# Patient Record
Sex: Male | Born: 1935
Health system: Southern US, Community
[De-identification: ages and names within clinical notes are randomized; demographics above are authoritative.]

## PROBLEM LIST (undated history)

## (undated) DIAGNOSIS — I714 Abdominal aortic aneurysm, without rupture, unspecified: Secondary | ICD-10-CM

## (undated) DIAGNOSIS — F1011 Alcohol abuse, in remission: Secondary | ICD-10-CM

## (undated) DIAGNOSIS — J302 Other seasonal allergic rhinitis: Secondary | ICD-10-CM

## (undated) DIAGNOSIS — T7840XA Allergy, unspecified, initial encounter: Secondary | ICD-10-CM

## (undated) DIAGNOSIS — E7800 Pure hypercholesterolemia, unspecified: Secondary | ICD-10-CM

## (undated) DIAGNOSIS — C4491 Basal cell carcinoma of skin, unspecified: Secondary | ICD-10-CM

## (undated) DIAGNOSIS — Z87898 Personal history of other specified conditions: Secondary | ICD-10-CM

## (undated) DIAGNOSIS — K5792 Diverticulitis of intestine, part unspecified, without perforation or abscess without bleeding: Secondary | ICD-10-CM

## (undated) DIAGNOSIS — K759 Inflammatory liver disease, unspecified: Secondary | ICD-10-CM

## (undated) DIAGNOSIS — M1990 Unspecified osteoarthritis, unspecified site: Secondary | ICD-10-CM

## (undated) HISTORY — DX: Alcohol abuse, in remission: F10.11

## (undated) HISTORY — PX: BACK SURGERY: SHX140

## (undated) HISTORY — DX: Abdominal aortic aneurysm, without rupture: I71.4

## (undated) HISTORY — DX: Abdominal aortic aneurysm, without rupture, unspecified: I71.40

## (undated) HISTORY — PX: COLONOSCOPY: SHX174

## (undated) HISTORY — PX: CATARACT EXTRACTION W/ INTRAOCULAR LENS  IMPLANT, BILATERAL: SHX1307

## (undated) HISTORY — PX: SMALL INTESTINE SURGERY: SHX150

## (undated) HISTORY — DX: Other seasonal allergic rhinitis: J30.2

## (undated) HISTORY — DX: Unspecified osteoarthritis, unspecified site: M19.90

## (undated) HISTORY — DX: Diverticulitis of intestine, part unspecified, without perforation or abscess without bleeding: K57.92

## (undated) HISTORY — DX: Personal history of other specified conditions: Z87.898

## (undated) HISTORY — PX: BASAL CELL CARCINOMA EXCISION: SHX1214

## (undated) HISTORY — PX: MOHS SURGERY: SUR867

## (undated) HISTORY — DX: Allergy, unspecified, initial encounter: T78.40XA

---

## 1982-10-15 HISTORY — PX: POSTERIOR FUSION LUMBAR SPINE: SUR632

## 2003-03-04 DIAGNOSIS — K4090 Unilateral inguinal hernia, without obstruction or gangrene, not specified as recurrent: Secondary | ICD-10-CM

## 2003-03-04 HISTORY — DX: Unilateral inguinal hernia, without obstruction or gangrene, not specified as recurrent: K40.90

## 2003-03-04 HISTORY — PX: INGUINAL HERNIA REPAIR: SUR1180

## 2006-02-13 ENCOUNTER — Emergency Department (HOSPITAL_COMMUNITY): Admission: EM | Admit: 2006-02-13 | Discharge: 2006-02-13 | Payer: Self-pay | Admitting: Emergency Medicine

## 2009-08-29 ENCOUNTER — Ambulatory Visit: Payer: Self-pay | Admitting: Vascular Surgery

## 2010-03-05 ENCOUNTER — Ambulatory Visit
Admission: RE | Admit: 2010-03-05 | Discharge: 2010-03-05 | Payer: Self-pay | Source: Home / Self Care | Attending: Vascular Surgery | Admitting: Vascular Surgery

## 2010-03-05 ENCOUNTER — Ambulatory Visit: Admit: 2010-03-05 | Payer: Self-pay | Admitting: Vascular Surgery

## 2010-07-16 NOTE — Procedures (Signed)
DUPLEX ULTRASOUND OF ABDOMINAL AORTA   INDICATION:  Abdominal aortic aneurysm.   HISTORY:  Diabetes:  No  Cardiac:  No  Hypertension:  No  Smoking:  No  Family History:  Father and brother have AAA.  Previous Surgery:  No   DUPLEX EXAM:         AP (cm)                   TRANSVERSE (cm)  Proximal             2.85 cm                   2.97 cm  Mid                  2.0 cm                    2.2 cm  Distal               3.5 cm                    3.5 cm  Right Iliac          not visualized            not visualized  Left Iliac           not visualized            not visualized   PREVIOUS:   IMPRESSION:  1. Aneurysmal dilatation of the distal abdominal aorta with maximal      diameters are described above.  2. Diffuse atherosclerotic plaque noted throughout the abdominal aorta      with no increase in Doppler velocities.  3. Unable to adequately visualize the bilateral common iliac arteries      due to overlying bowel gas patterns.   ___________________________________________  Larina Earthly, M.D.   CH/MEDQ  D:  03/06/2010  T:  03/06/2010  Job:  [id number]

## 2010-07-16 NOTE — Consult Note (Signed)
NEW PATIENT CONSULTATION   DINA, MOBLEY  DOB:  05-May-1934                                       08/29/2009  QVZDG#:[id number]   REASON FOR CONSULTATION:  Infrarenal abdominal aortic aneurysm.   HISTORY OF PRESENT ILLNESS:  The patient is a 76 year old gentleman who  had an episode of abdominal discomfort in mid May.  He was seen in an  urgent care center for evaluation and workup was negative.  He was felt  to have a prominent aortic pulsation and subsequently underwent  ultrasound which confirmed a 3.4 cm infrarenal abdominal aortic  aneurysm.  The patient had no known history of this and actually had a  screening study in 2001 at which time he was told that he did not have  any evidence of an aneurysm.  His abdominal pain is completely resolved  and he has no symptoms referable to his aneurysm.   His past history is significant for lumbar disk surgery in 1984 and  hernia surgery in 2005.  He is otherwise healthy with no major medical  difficulties.  Specifically no heart disease, no hypertension or  elevated cholesterol.   His family history is significant for aneurysms.  His father died in [place] at age 47 following elective aneurysm repair.  This was in 1993.  His brother had aneurysm surgery at age 80 in 2001 with a successful  repair.  He does have coronary artery disease in his family history,  none in his own history.   SOCIAL HISTORY:  He is married with two children.  He is retired.  He  does not smoke or drink alcohol.   REVIEW OF SYSTEMS:  Is noted in his chart, specifically no major  difficulties.  His weight is 160 pounds.  He is 5 feet 10 inches tall.  He has no weight loss or weight gain.   PHYSICAL EXAMINATION:  General:  A well-developed, well-nourished white  male appearing stated age of 57.  Vital signs:  Blood pressure is  132/79, pulse 48, respirations 16.  He is in no acute distress.  HEENT:  Normal.  Chest:  Clear  bilaterally without rhonchi or wheezes.  Heart:  Regular rate and rhythm without murmur.  His radial pulses are 2+  bilaterally.  He has 2+ femoral, 2+ popliteal and 2+ dorsalis pedis  pulses without evidence of peripheral aneurysms.  His carotid arteries  are without bruits bilaterally.  Abdomen:  Soft, nontender.  He is thin.  He does have an easily palpable nontender aorta.  Musculoskeletal:  Shows no major deformities or cyanosis.  Neurological:  No focal  weakness or paresthesias.  Skin:  Without ulcers or rashes.   I did review his ultrasound report with the patient.  I explained that  this is a very small aneurysm.  I explained that we would recommend  repeat imaging of this again in 6 months and if is remains small would  recommend yearly ultrasound after that.  I explained the extremely low  risk for rupture of this small sized aneurysm and he understands serial  followup to rule out progression.  We will see him again in 6 months for  continued evaluation of his aneurysm.     Larina Earthly, M.D.  Electronically Signed   TFE/MEDQ  D:  08/29/2009  T:  08/30/2009  Job:  [id number]

## 2010-07-16 NOTE — Assessment & Plan Note (Signed)
OFFICE VISIT   GABRIELE, LOVELAND  DOB:  09-04-1934                                       03/05/2010  GURKY#:[id number]   The patient presents today for followup of small infrarenal abdominal  aortic aneurysm discovered in May of 2011 with an ultrasound for  abdominal discomfort.  This has completely resolved.  He has remained in  excellent health since my last visit with him.  He did have an episode  of microscopic hematuria on urinalysis and underwent an extensive  evaluation showing no evidence of significant pathology.   PHYSICAL EXAM:  Vital signs:  Blood pressure is 177/69, pulse 50,  respirations 18.  HEENT:  Normal.  Abdomen:  Soft.  He is thin and does  have an easily palpable aortic pulsation.  He is nontender.  Femoral and  popliteal pulses are 2+ and normal.  Musculoskeletal:  Shows no major  deformities or cyanosis.  Neurological:  No focal weakness or  paresthesias.  Skin:  Without ulcers or rashes.   He did undergo repeat ultrasound today which I have ordered and  reviewed.  This shows maximal diameter of 3.5 cm with no significant  change from his last study of 3.4 cm 6 months ago.  I discussed this at  length with the patient and have recommended yearly ultrasound followup  in our office.  Of note, he did have his father died after complications  of what sounds like an open repair and his brother who lives in [place]  recently underwent extensive surgery after having infection of his  prosthetic graft after anatomic bypass and removal of his infected  aortic graft.  I explained to the patient that both of these events  death around surgery and graft infection are extremely uncommon.  I also  explained with his small size that he would be unlikely that he would  develop difficulty in the future but would remain surveillance of his  aneurysm.     Larina Earthly, M.D.  Electronically Signed   TFE/MEDQ  D:  03/05/2010  T:  03/05/2010  Job:   [id number]

## 2011-03-13 ENCOUNTER — Ambulatory Visit (INDEPENDENT_AMBULATORY_CARE_PROVIDER_SITE_OTHER): Payer: Medicare Other | Admitting: *Deleted

## 2011-03-13 DIAGNOSIS — I714 Abdominal aortic aneurysm, without rupture: Secondary | ICD-10-CM

## 2011-03-19 NOTE — Procedures (Unsigned)
DUPLEX ULTRASOUND OF ABDOMINAL AORTA  INDICATION:  Follow up AAA.  HISTORY: Diabetes:  No. Cardiac:  No. Hypertension:  No. Smoking:  No. Connective Tissue Disorder: Family History:  Father and brother with AAA. Previous Surgery:  No.  DUPLEX EXAM:         AP (cm)                   TRANSVERSE (cm) Proximal             2.12 cm                   2.12 cm Mid                  3.56 cm                   3.44 cm Distal               2.05 cm                   2.12 cm Right Iliac          1.02 cm                   1.00 cm Left Iliac           0.99 cm                   1.00 cm  PREVIOUS:  Date: 03/05/09  AP:  3.5  TRANSVERSE:  3.5  IMPRESSION:  Stable abdominal aortic aneurysm measuring 3.56 cm X 3.44 cm.  ___________________________________________ Larina Earthly, M.D.  LT/MEDQ  D:  03/13/2011  T:  03/13/2011  Job:  [id number]

## 2011-03-20 ENCOUNTER — Encounter: Payer: Self-pay | Admitting: Vascular Surgery

## 2011-03-20 ENCOUNTER — Other Ambulatory Visit: Payer: Self-pay | Admitting: *Deleted

## 2011-03-20 DIAGNOSIS — I714 Abdominal aortic aneurysm, without rupture: Secondary | ICD-10-CM

## 2012-02-24 ENCOUNTER — Encounter: Payer: Self-pay | Admitting: Vascular Surgery

## 2012-03-11 ENCOUNTER — Encounter: Payer: Self-pay | Admitting: *Deleted

## 2012-03-12 ENCOUNTER — Encounter: Payer: Self-pay | Admitting: Cardiology

## 2012-03-12 ENCOUNTER — Other Ambulatory Visit: Payer: Self-pay | Admitting: *Deleted

## 2012-03-12 ENCOUNTER — Ambulatory Visit (INDEPENDENT_AMBULATORY_CARE_PROVIDER_SITE_OTHER): Payer: Medicare Other | Admitting: Cardiology

## 2012-03-12 VITALS — BP 132/62 | HR 72 | Ht 71.0 in | Wt 151.0 lb

## 2012-03-12 DIAGNOSIS — I714 Abdominal aortic aneurysm, without rupture, unspecified: Secondary | ICD-10-CM

## 2012-03-12 DIAGNOSIS — R079 Chest pain, unspecified: Secondary | ICD-10-CM

## 2012-03-12 DIAGNOSIS — E785 Hyperlipidemia, unspecified: Secondary | ICD-10-CM

## 2012-03-12 NOTE — Patient Instructions (Addendum)
Start aspirin 81mg  daily. Take this with food.  Your physician has requested that you have en exercise stress myoview. For further information please visit [email or web address] Please follow instruction sheet, as given.  Your physician recommends that you return for a FASTING lipid profile /liver profile/BMET in 1 month.   Your physician wants you to follow-up in: 1 year with Dr Shirlee Latch. (January 2015).  You will receive a reminder letter in the mail two months in advance. If you don't receive a letter, please call our office to schedule the follow-up appointment.

## 2012-03-12 NOTE — Progress Notes (Addendum)
Patient ID: Charles Stephenson, male   DOB: [birth year], 78 y.o.   MRN: [id number] PCP: Dr Brantley Stage in [place]  78 yo with h/o AAA but otherwise generally healthy presents for cardiology followup. Patient moved a couple of years ago to [place] from [place] but still sees a PCP in [place].  His brother recently passed away and had been in the hospital for a number of weeks prior.  Therefore, Charles Stephenson has been under a fair amount of stress.  He has noted a substernal burning sensation over the last 2 months.  This occurs most days.  It is not related to exertion but sometimes is related to meals.  Sometimes it is associated with belching.  He has started omeprazole and the chest sensation seems to have improved but still occurs.    He has good exercise tolerance.  He walks briskly 3.5 miles/day and plays golf weekly.  He has not noticed a decline in his exercise capacity.  About a year ago, he was washing dishes at church and had an episode of very severe chest pressure/tightness that lasted for about 30 seconds and resolved completely.  He still remembers it because of the severity.  He also was recently restarted on a statin due to elevated LDL.   ECG: NSR, poor anterior R wave progression  Labs (12/13): K 4.4, creatinine 0.93, LLD 143, HDL 73, TSH normal, HCT 44.6  PMH: 1. GERD 2. Hyperlipidemia 3. AAA: Followed at [facility]. Last abdominal US in 1/13 showed 3.6 x 3.4 cm AAA.  4. L-spine surgery 5. Hernia repair  SH: Married with 2 children.  Retired, moved from [employer] to [place] several years ago.  Nonsmoker.    FH: Father with AAA, brother with AAA/pulmonary fibrosis/MI at 59, grandfather probably had MI.   ROS: All systems reviewed and negative except as per HPI.   Current Outpatient Prescriptions  Medication Sig Dispense Refill  . atorvastatin (LIPITOR) 20 MG tablet Take 1 tablet by mouth Daily.      Marland Kitchen omeprazole (PRILOSEC OTC) 20 MG tablet Take 20 mg by mouth daily.      Marland Kitchen  aspirin EC 81 MG tablet Take 1 tablet (81 mg total) by mouth daily.        BP 132/62  Pulse 72  Ht 5\' 11"  (1.803 m)  Wt 151 lb (68.493 kg)  BMI 21.06 kg/m2 General: NAD Neck: No JVD, no thyromegaly or thyroid nodule.  Lungs: Clear to auscultation bilaterally with normal respiratory effort. CV: Nondisplaced PMI.  Heart regular S1/S2, no S3/S4, 1/6 SEM RUSB.  No peripheral edema.  No carotid bruit.  Normal pedal pulses.  Abdomen: Soft, nontender, no hepatosplenomegaly, no distention.  Skin: Intact without lesions or rashes.  Neurologic: Alert and oriented x 3.  Psych: Normal affect. Extremities: No clubbing or cyanosis.  HEENT: Normal.   Assessment/Plan: 1. Chest pain: This seems to have developed during a period of stress dealing with his brother's illness.  It may be GERD-related.  It is not clearly exertional.  ECG shows poor anterior R wave progression.  He does have vascular disease risk (has AAA).  Omeprazole seems to have helped some (or symptoms have subsided on their own) but he still gets episodes of chest burning.   - Start ASA 81 mg daily - ETT-Sestamibi for risk stratification.  2. Hyperlipidemia: Continue atorvastatin and check lipids/LFTs.  3. AAA: To be followed by [facility].  Has appointment soon.  4. Murmur: Minor systolic murmur RUSB, suspect some aortic  sclerosis.   Marca Ancona 03/12/2012 1:23 PM

## 2012-03-16 ENCOUNTER — Ambulatory Visit: Payer: Medicare Other | Admitting: Vascular Surgery

## 2012-03-16 ENCOUNTER — Other Ambulatory Visit: Payer: Medicare Other

## 2012-03-16 HISTORY — PX: CARDIOVASCULAR STRESS TEST: SHX262

## 2012-03-17 ENCOUNTER — Ambulatory Visit (HOSPITAL_COMMUNITY): Payer: Medicare Other | Attending: Cardiovascular Disease | Admitting: Radiology

## 2012-03-17 VITALS — Ht 71.0 in | Wt 156.0 lb

## 2012-03-17 DIAGNOSIS — I714 Abdominal aortic aneurysm, without rupture, unspecified: Secondary | ICD-10-CM | POA: Insufficient documentation

## 2012-03-17 DIAGNOSIS — R0602 Shortness of breath: Secondary | ICD-10-CM

## 2012-03-17 DIAGNOSIS — R079 Chest pain, unspecified: Secondary | ICD-10-CM | POA: Insufficient documentation

## 2012-03-17 DIAGNOSIS — R0609 Other forms of dyspnea: Secondary | ICD-10-CM | POA: Insufficient documentation

## 2012-03-17 DIAGNOSIS — R0989 Other specified symptoms and signs involving the circulatory and respiratory systems: Secondary | ICD-10-CM | POA: Insufficient documentation

## 2012-03-17 MED ORDER — TECHNETIUM TC 99M SESTAMIBI GENERIC - CARDIOLITE
10.0000 | Freq: Once | INTRAVENOUS | Status: AC | PRN
  Administered 2012-03-17: 10 via INTRAVENOUS

## 2012-03-17 MED ORDER — TECHNETIUM TC 99M SESTAMIBI GENERIC - CARDIOLITE
30.0000 | Freq: Once | INTRAVENOUS | Status: AC | PRN
  Administered 2012-03-17: 30 via INTRAVENOUS

## 2012-03-17 NOTE — Progress Notes (Addendum)
[facility] SITE 3 NUCLEAR MED [address] [place], [place] [address] [phone]    Cardiology Nuclear Med Study  Charles Stephenson is a 78 y.o. male     MRN : [id number]     DOB: [birth year]  Procedure Date: 03/17/2012  Nuclear Med Background Indication for Stress Test:  Evaluation for Ischemia History:  AAA 3.6cm, No prior known history of CAD, and '06 Myocardial Perfusion Study-normal per patient Recruitment consultant) Cardiac Risk Factors: Premature Family History - CAD and Lipids  Symptoms: Chest pain without exertion, occasionally occurs with meals (last occurrence at present 1/10),  DOE and Light-Headedness   Nuclear Pre-Procedure Caffeine/Decaff Intake:  None NPO After: 7:00pm   Lungs:  clear O2 Sat: 98% on room air. IV 0.9% NS with Angio Cath:  20g  IV Site: R Antecubital  IV Started by:  Bonnita Levan, RN  Chest Size (in):  42 Cup Size: n/a  Height: 5\' 11"  (1.803 m)  Weight:  156 lb (70.761 kg)  BMI:  Body mass index is 21.76 kg/(m^2). Tech Comments:  N/A    Nuclear Med Study 1 or 2 day study: 1 day  Stress Test Type:  Stress  Reading MD: Kristeen Miss, MD  Order Authorizing Provider:  Marca Ancona, MD  Resting Radionuclide: Technetium 61m Sestamibi  Resting Radionuclide Dose: 11.0 mCi   Stress Radionuclide:  Technetium 50m Sestamibi  Stress Radionuclide Dose: 32.7 mCi           Stress Protocol Rest HR: 47 Stress HR: 133  Rest BP: 133/86 Stress BP: 154/94  Exercise Time (min): 7:15 METS: 8.9   Predicted Max HR: 145 bpm % Max HR: 91.72 bpm     Dose of Adenosine (mg):  n/a Dose of Lexiscan: n/a mg  Dose of Atropine (mg): n/a Dose of Dobutamine: n/a mcg/kg/min (at max HR)  Stress Test Technologist: Irean Hong, RN  Nuclear Technologist:  Domenic Polite, CNMT     Rest Procedure:  Myocardial perfusion imaging was performed at rest 45 minutes following the intravenous administration of Technetium 34m Sestamibi. Rest ECG:  Sinus bradycardia at 47, TWI in  aVL  Stress Procedure:  The patient exercised on the treadmill utilizing the Bruce Protocol for 7:15 minutes, RPE=15. The patient stopped due to fatigue and there was no change in baseline chest pain 1/10. There were frequent PAC's, occasional PVC, Bigeminy. Technetium 57m Sestamibi was injected at peak exercise and myocardial perfusion imaging was performed after a brief delay. Stress ECG: No significant change from baseline ECG  QPS Raw Data Images:  There is interference from nuclear activity from structures below the diaphragm. This does not affect the ability to read the study. Stress Images:  There is mild attenuation of the entire inferior wall with normal uptake in the other regions.   Rest Images:  There is mild attenuation of the entire inferior wall with normal uptake in the other regions.  Subtraction (SDS):  No evidence of ischemia.  I suspect the inferior attenuation is due to uptake in the structures below the diaphragm.  Transient Ischemic Dilatation (Normal <1.22):  1.01 Lung/Heart Ratio (Normal <0.45):  0.29  Quantitative Gated Spect Images QGS EDV:  102 ml QGS ESV:  41 ml  Impression Exercise Capacity:  Good exercise capacity. BP Response:  Normal blood pressure response. Clinical Symptoms:  No significant symptoms noted. ECG Impression:  No significant ST segment change suggestive of ischemia. Comparison with Prior Nuclear Study: No images to compare  Overall Impression:  Normal stress nuclear study.  No evidence of ischemia.    LV Ejection Fraction: 60%.  LV Wall Motion:  NL LV Function; NL Wall Motion.  The inferior wall contracts normally.    Vesta Mixer, Montez Hageman., MD, [facility] 03/17/2012, 5:27 PM Office - [phone] Pager [phone]  Normal study with some inferior attenuation.  Please inform patient.   Marca Ancona 03/18/2012 10:56 AM

## 2012-03-18 NOTE — Progress Notes (Signed)
Results given.

## 2012-03-22 ENCOUNTER — Encounter: Payer: Self-pay | Admitting: Vascular Surgery

## 2012-03-22 ENCOUNTER — Other Ambulatory Visit: Payer: Self-pay | Admitting: *Deleted

## 2012-03-22 DIAGNOSIS — I714 Abdominal aortic aneurysm, without rupture: Secondary | ICD-10-CM

## 2012-03-23 ENCOUNTER — Ambulatory Visit (INDEPENDENT_AMBULATORY_CARE_PROVIDER_SITE_OTHER): Payer: Medicare Other | Admitting: Neurosurgery

## 2012-03-23 ENCOUNTER — Encounter (INDEPENDENT_AMBULATORY_CARE_PROVIDER_SITE_OTHER): Payer: Medicare Other | Admitting: *Deleted

## 2012-03-23 ENCOUNTER — Encounter: Payer: Self-pay | Admitting: Neurosurgery

## 2012-03-23 VITALS — BP 126/68 | HR 45 | Resp 16 | Ht 71.0 in | Wt 158.0 lb

## 2012-03-23 DIAGNOSIS — I714 Abdominal aortic aneurysm, without rupture: Secondary | ICD-10-CM

## 2012-03-23 NOTE — Progress Notes (Signed)
[facility] [place] AAA/Carotid Office Note  CC: AAA surveillance Referring Physician: Early  History of Present Illness: 78 year old male patient of Dr. Arbie Cookey followed for known AAA. The patient denies any unusual abdominal or back pain. The patient denies any new medical diagnoses or recent surgery.  Past Medical History  Diagnosis Date  . AAA (abdominal aortic aneurysm)   . History of vertigo   . H/O hypercholesterolemia     ROS: [x]  Positive   [ ]  Denies    General: [ ]  Weight loss, [ ]  Fever, [ ]  chills Neurologic: [ ]  Dizziness, [ ]  Blackouts, [ ]  Seizure [ ]  Stroke, [ ]  "Mini stroke", [ ]  Slurred speech, [ ]  Temporary blindness; [ ]  weakness in arms or legs, [ ]  Hoarseness Cardiac: [ ]  Chest pain/pressure, [ ]  Shortness of breath at rest [ ]  Shortness of breath with exertion, [ ]  Atrial fibrillation or irregular heartbeat Vascular: [ ]  Pain in legs with walking, [ ]  Pain in legs at rest, [ ]  Pain in legs at night,  [ ]  Non-healing ulcer, [ ]  Blood clot in vein/DVT,   Pulmonary: [ ]  Home oxygen, [ ]  Productive cough, [ ]  Coughing up blood, [ ]  Asthma,  [ ]  Wheezing Musculoskeletal:  [ ]  Arthritis, [ ]  Low back pain, [ ]  Joint pain Hematologic: [ ]  Easy Bruising, [ ]  Anemia; [ ]  Hepatitis Gastrointestinal: [ ]  Blood in stool, [ ]  Gastroesophageal Reflux/heartburn, [ ]  Trouble swallowing Urinary: [ ]  chronic Kidney disease, [ ]  on HD - [ ]  MWF or [ ]  TTHS, [ ]  Burning with urination, [ ]  Difficulty urinating Skin: [ ]  Rashes, [ ]  Wounds Psychological: [ ]  Anxiety, [ ]  Depression   Social History History  Substance Use Topics  . Smoking status: Never Smoker   . Smokeless tobacco: Never Used  . Alcohol Use: No    Family History Family History  Problem Relation Age of Onset  . AAA (abdominal aortic aneurysm) Father   . Heart disease Father   . AAA (abdominal aortic aneurysm) Brother   . Heart disease Brother     No Known Allergies  Current  Outpatient Prescriptions  Medication Sig Dispense Refill  . aspirin EC 81 MG tablet Take 1 tablet (81 mg total) by mouth daily.      Marland Kitchen atorvastatin (LIPITOR) 20 MG tablet Take 1 tablet by mouth Daily.      Marland Kitchen omeprazole (PRILOSEC OTC) 20 MG tablet Take 20 mg by mouth daily.        Physical Examination  Filed Vitals:   03/23/12 0912  BP: 126/68  Pulse: 45  Resp: 16    Body mass index is 22.04 kg/(m^2).  General:  WDWN in NAD Gait: Normal HEENT: WNL Eyes: Pupils equal Pulmonary: normal non-labored breathing , without Rales, rhonchi,  wheezing Cardiac: RRR, without  Murmurs, rubs or gallops; Abdomen: soft, NT, no masses Skin: no rashes, ulcers noted  Vascular Exam Pulses: 3+ radial pulses bilaterally, palpable femoral pulses bilaterally, there is a palpable pulsatile abdominal mass that is nontender Carotid bruits: Carotid pulses to auscultation no bruits are heard Extremities without ischemic changes, no Gangrene , no cellulitis; no open wounds;  Musculoskeletal: no muscle wasting or atrophy   Neurologic: A&O X 3; Appropriate Affect ; SENSATION: normal; MOTOR FUNCTION:  moving all extremities equally. Speech is fluent/normal  Non-Invasive Vascular Imaging AAA duplex today shows a maximum diameter 3.6 AP by 3.6 transverse which is a slight increase  from previous exam one year ago when he was 3.5.  ASSESSMENT/PLAN: Asymptomatic AAA that will followup in one year with repeat duplex. The patient's questions were encouraged and answered, he is in agreement with this plan.  Lauree Chandler ANP   Clinic MD: Early

## 2012-03-24 NOTE — Addendum Note (Signed)
Addended by: Dannielle Karvonen on: 03/24/2012 03:19 PM   Modules accepted: Orders

## 2012-03-25 ENCOUNTER — Encounter: Payer: Self-pay | Admitting: Cardiology

## 2012-04-17 ENCOUNTER — Other Ambulatory Visit: Payer: Self-pay

## 2012-04-20 ENCOUNTER — Other Ambulatory Visit (INDEPENDENT_AMBULATORY_CARE_PROVIDER_SITE_OTHER): Payer: Medicare Other

## 2012-04-20 DIAGNOSIS — R079 Chest pain, unspecified: Secondary | ICD-10-CM

## 2012-04-20 LAB — HEPATIC FUNCTION PANEL
ALT: 26 U/L (ref 0–53)
AST: 27 U/L (ref 0–37)
Albumin: 4.1 g/dL (ref 3.5–5.2)
Alkaline Phosphatase: 55 U/L (ref 39–117)
Bilirubin, Direct: 0.1 mg/dL (ref 0.0–0.3)
Total Bilirubin: 0.8 mg/dL (ref 0.3–1.2)
Total Protein: 7.1 g/dL (ref 6.0–8.3)

## 2012-04-20 LAB — LIPID PANEL
Cholesterol: 145 mg/dL (ref 0–200)
HDL: 62.3 mg/dL (ref >39.00)
LDL Cholesterol: 73 mg/dL (ref 0–99)
Total CHOL/HDL Ratio: 2
Triglycerides: 49 mg/dL (ref 0.0–149.0)
VLDL: 9.8 mg/dL (ref 0.0–40.0)

## 2012-04-20 LAB — BASIC METABOLIC PANEL
BUN: 16 mg/dL (ref 6–23)
CO2: 32 mEq/L (ref 19–32)
Calcium: 9.5 mg/dL (ref 8.4–10.5)
Chloride: 104 mEq/L (ref 96–112)
Creatinine, Ser: 1 mg/dL (ref 0.4–1.5)
GFR: 75.55 mL/min (ref >60.00)
Glucose, Bld: 85 mg/dL (ref 70–99)
Potassium: 4.4 mEq/L (ref 3.5–5.1)
Sodium: 140 mEq/L (ref 135–145)

## 2012-04-29 ENCOUNTER — Telehealth: Payer: Self-pay | Admitting: Cardiology

## 2012-04-29 NOTE — Telephone Encounter (Signed)
Follow Up    Returning phone call from earlier from [place].

## 2012-05-04 NOTE — Telephone Encounter (Signed)
Pt was notified of lab results. 

## 2012-10-06 ENCOUNTER — Other Ambulatory Visit: Payer: Self-pay

## 2013-01-06 ENCOUNTER — Other Ambulatory Visit: Payer: Self-pay

## 2013-02-28 ENCOUNTER — Emergency Department (HOSPITAL_COMMUNITY)
Admission: EM | Admit: 2013-02-28 | Discharge: 2013-02-28 | Disposition: A | Discharge status: Home / Self Care | Payer: Medicare Other | Source: Home / Self Care | Attending: Family Medicine | Admitting: Family Medicine

## 2013-02-28 ENCOUNTER — Encounter (HOSPITAL_COMMUNITY): Payer: Self-pay | Admitting: Emergency Medicine

## 2013-02-28 DIAGNOSIS — M5412 Radiculopathy, cervical region: Secondary | ICD-10-CM

## 2013-02-28 MED ORDER — GABAPENTIN 100 MG PO CAPS: 100.0000 mg | ORAL_CAPSULE | Freq: Every day | ORAL | Status: DC

## 2013-02-28 MED ORDER — PREDNISONE 10 MG PO KIT: PACK | ORAL | Status: DC

## 2013-02-28 NOTE — ED Notes (Signed)
Pt c/o intermittent right shoulder pain onset 2 months... Denies inj/trauma... Reports he plays golf often w/no problem Pain increases at night and will radiate to right middle finger... Denies: numbness/tingly Alert w/no signs of acute distress.

## 2013-02-28 NOTE — ED Provider Notes (Signed)
Charles Stephenson is a 78 y.o. male who presents to Urgent Care today for Right shoulder pain. This is been present for 2 months and has occurred without injury. Patient has pain in his right shoulder that radiates to his right third digit. The symptoms are worse at night.  They can wake him from sleep. He denies any weakness or numbness. He notes that ibuprofen p.m. Seems to help. He denies any difficulty walking or lack of coordination bowel or bladder dysfunction. He does have a history of lumbar radicular symptoms which has been well treated with epidural steroid injection in the past. He feels well otherwise.  No pain with overhand motion   Past Medical History  Diagnosis Date  . AAA (abdominal aortic aneurysm)   . History of vertigo   . H/O hypercholesterolemia    History  Substance Use Topics  . Smoking status: Never Smoker   . Smokeless tobacco: Never Used  . Alcohol Use: No   ROS as above Medications reviewed. No current facility-administered medications for this encounter.   Current Outpatient Prescriptions  Medication Sig Dispense Refill  . aspirin EC 81 MG tablet Take 1 tablet (81 mg total) by mouth daily.      Marland Kitchen atorvastatin (LIPITOR) 20 MG tablet Take 1 tablet by mouth Daily.      Marland Kitchen gabapentin (NEURONTIN) 100 MG capsule Take 1 capsule (100 mg total) by mouth at bedtime.  30 capsule  0  . omeprazole (PRILOSEC OTC) 20 MG tablet Take 20 mg by mouth daily.      . PredniSONE 10 MG KIT 12 day dose pack po  1 kit  0    Exam:  BP 160/100  Pulse 56  Temp(Src) 97.8 F (36.6 C) (Oral)  Resp 16  Ht 5\' 11"  (1.803 m)  Wt 155 lb (70.308 kg)  BMI 21.63 kg/m2  SpO2 98% Gen: Well NAD Neck: nontender to spinal midline. Normal extension and flexion range of motion. No rotational range of motion. However patient is significantly limited with lateral flexion range of motion. Negative Spurling's test bilaterally.  Right shoulder:  Shoulder is mildly tender at the bicipital  groove. Pain-free full range of motion. Negative impingement testing. Strength is intact throughout. Negative empty can.  Left shoulder:  Nontender normal strength full range of motion negative impingement testing  Neuro: sensation and strength are intact distally bilateral upper extremities  Capillary refill is intact throughout   Assessment and Plan: 78 y.o. male with  1)right shoulder pain. This is likely cervical radicular related. However impingement versus DJD is a possibility. Plan to treat with a prednisone dose pack and low dose gabapentin at bedtime.  I recommend followup with orthopedics in a few weeks for further evaluation and management.  Discussed warning signs or symptoms. Please see discharge instructions. Patient expresses understanding.      Rodolph Bong, MD 02/28/13 [phone]

## 2013-03-22 ENCOUNTER — Encounter: Payer: Self-pay | Admitting: Family

## 2013-03-23 ENCOUNTER — Ambulatory Visit: Payer: Medicare Other | Admitting: Family

## 2013-03-23 ENCOUNTER — Other Ambulatory Visit (HOSPITAL_COMMUNITY): Payer: Medicare Other

## 2013-03-23 ENCOUNTER — Ambulatory Visit (INDEPENDENT_AMBULATORY_CARE_PROVIDER_SITE_OTHER): Payer: Medicare Other | Admitting: Family

## 2013-03-23 ENCOUNTER — Encounter: Payer: Self-pay | Admitting: Family

## 2013-03-23 ENCOUNTER — Ambulatory Visit (HOSPITAL_COMMUNITY)
Admission: RE | Admit: 2013-03-23 | Discharge: 2013-03-23 | Disposition: A | Discharge status: Home / Self Care | Payer: Medicare Other | Source: Ambulatory Visit | Attending: Family | Admitting: Family

## 2013-03-23 VITALS — BP 124/79 | HR 51 | Resp 14 | Ht 71.0 in | Wt 154.0 lb

## 2013-03-23 DIAGNOSIS — I714 Abdominal aortic aneurysm, without rupture, unspecified: Secondary | ICD-10-CM | POA: Insufficient documentation

## 2013-03-23 NOTE — Patient Instructions (Signed)

## 2013-03-23 NOTE — Progress Notes (Signed)
[facility] [place]  Established Abdominal Aortic Aneurysm  History of Present Illness  Charles Stephenson is a 79 y.o. (04/29/1934) male patient of Dr. Donnetta Hutching who presents with chief complaint: follow up for AAA. His AAA was incidentally found during routine physical exam by palpation.   Previous studies demonstrate an AAA, measuring 3.6 cm a year ago.   The patient does not have back or abdominal pain, even though he has had back surgery.   The patient denies claudication in legs with walking, he walks 3-4 miles. The patient denies history of stroke or TIA symptoms. Pt denies new medical problems or recent surgeries. Father died of clotting disorder during surgery for AAA, as pt describes, sounds like possibly DIC. Pt's brother also has a AAA, had an EVAR placed which became infected, 10 hour surgery to correct this.  Pt Diabetic: No Pt smoker: non-smoker He denies any ETOH consumption  Past Medical History  Diagnosis Date  . AAA (abdominal aortic aneurysm)   . History of vertigo   . H/O hypercholesterolemia    Past Surgical History  Procedure Laterality Date  . Back surgery  1984    ruptured disc  . Hernia repair  2005  . Cardiovascular stress test  Jan. 14,2014  . Spine surgery     Social History History   Social History  . Marital Status: Married    Spouse Name: N/A    Number of Children: N/A  . Years of Education: N/A   Occupational History  . Not on file.   Social History Main Topics  . Smoking status: Never Smoker   . Smokeless tobacco: Never Used  . Alcohol Use: No  . Drug Use: No  . Sexual Activity: Not on file   Other Topics Concern  . Not on file   Social History Narrative  . No narrative on file   Family History Family History  Problem Relation Age of Onset  . AAA (abdominal aortic aneurysm) Father   . Heart disease Father   . AAA (abdominal aortic aneurysm) Brother   . Heart disease Brother     Current Outpatient  Prescriptions on File Prior to Visit  Medication Sig Dispense Refill  . atorvastatin (LIPITOR) 20 MG tablet Take 1 tablet by mouth Daily.      Marland Kitchen aspirin EC 81 MG tablet Take 1 tablet (81 mg total) by mouth daily.      Marland Kitchen gabapentin (NEURONTIN) 100 MG capsule Take 1 capsule (100 mg total) by mouth at bedtime.  30 capsule  0  . omeprazole (PRILOSEC OTC) 20 MG tablet Take 20 mg by mouth daily.      . PredniSONE 10 MG KIT 12 day dose pack po  1 kit  0   No current facility-administered medications on file prior to visit.   No Known Allergies  ROS: See HPI for pertinent positives and negatives.  Physical Examination  Filed Vitals:   03/23/13 0909  BP: 124/79  Pulse: 51  Resp: 14   Filed Weights   03/23/13 0909  Weight: 154 lb (69.854 kg)   Body mass index is 21.49 kg/(m^2).  General: A&O x 3, WD, fit appearing.  Pulmonary: Sym exp, good air movt, CTAB, no rales, rhonchi, or wheezing.   Cardiac: RRR, Nl S1, S2, no Murmurs, rubs or gallops, rare premature beats.  Carotid Bruits Left Right   Negative Negative   Aorta is is palpable Radial pulses are 3+ palpable and =  VASCULAR EXAM:                                                                                                         LE Pulses LEFT RIGHT       FEMORAL   palpable   palpable        POPLITEAL  not palpable   not palpable       POSTERIOR TIBIAL   palpable    palpable        DORSALIS PEDIS      ANTERIOR TIBIAL  palpable   palpable      Gastrointestinal: soft, NTND, -G/R, - HSM, - masses, - CVAT B.  Musculoskeletal: M/S 5/5 throughout, Extremities without ischemic changes.   Neurologic: CN 2-12 intact, Pain and light touch intact in extremities, Motor exam as listed above.  Non-Invasive Vascular Imaging  AAA Duplex (03/23/2013)  Previous size: 3.6 cm (Date: 03/23/12)  Current size:  4.0 cm (Date: 03/23/13)  Medical Decision Making  The patient is a 79 y.o. male who  presents with asymptomatic AAA with increasing size, increased 0.4 cm in 1 year.   Based on this patient's exam and diagnostic studies, and due to 2 immediate family members having AAA, the patient will follow up in 6 months instead of a year,  with the following studies: AAA Duplex.  The threshold for repair is AAA size > 5.5 cm, growth > 1 cm/yr, and symptomatic status.  I emphasized the importance of maximal medical management including strict control of blood pressure, blood glucose, and lipid levels, antiplatelet agents, obtaining regular exercise, and continued cessation of smoking.   The patient was given information about AAA including signs, symptoms, treatment, and how to minimize the risk of enlargement and rupture of aneurysms.    The patient was advised to call 911 should the patient experience sudden onset abdominal or back pain.   Thank you for allowing [place] to participate in this patient's care.  Clemon Chambers, RN, MSN, FNP-C [facility] [place] Office: [phone]  Clinic Physician: Scot Dock  03/23/2013, 9:09 AM

## 2013-03-23 NOTE — Addendum Note (Signed)
Addended by: Dorthula Rue L on: 03/23/2013 03:53 PM   Modules accepted: Orders

## 2013-03-31 ENCOUNTER — Other Ambulatory Visit (HOSPITAL_COMMUNITY): Payer: Medicare Other

## 2013-03-31 ENCOUNTER — Ambulatory Visit: Payer: Medicare Other | Admitting: Family

## 2013-09-19 ENCOUNTER — Encounter: Payer: Self-pay | Admitting: Vascular Surgery

## 2013-09-20 ENCOUNTER — Encounter: Payer: Self-pay | Admitting: Family

## 2013-09-20 ENCOUNTER — Ambulatory Visit (HOSPITAL_COMMUNITY)
Admission: RE | Admit: 2013-09-20 | Discharge: 2013-09-20 | Disposition: A | Discharge status: Home / Self Care | Payer: Medicare Other | Source: Ambulatory Visit | Attending: Family | Admitting: Family

## 2013-09-20 ENCOUNTER — Ambulatory Visit (INDEPENDENT_AMBULATORY_CARE_PROVIDER_SITE_OTHER): Payer: Medicare Other | Admitting: Family

## 2013-09-20 VITALS — BP 157/84 | HR 43 | Resp 14 | Ht 71.0 in | Wt 157.0 lb

## 2013-09-20 DIAGNOSIS — I714 Abdominal aortic aneurysm, without rupture, unspecified: Secondary | ICD-10-CM | POA: Insufficient documentation

## 2013-09-20 DIAGNOSIS — Z48812 Encounter for surgical aftercare following surgery on the circulatory system: Secondary | ICD-10-CM

## 2013-09-20 NOTE — Progress Notes (Addendum)
[facility] [place]  Established Abdominal Aortic Aneurysm  History of Present Illness  Charles Stephenson is a 79 y.o. (11/17/1934) male patient of Dr. Early who presents with chief complaint: follow up for AAA.  His AAA was incidentally found during routine physical exam by palpation.  Previous studies demonstrate an AAA measuring 4.0 cm on 03/23/13.  The patient does not have back or abdominal pain, even though he has had back surgery.  The patient denies claudication in legs with walking, he walks 3-4 miles.  The patient denies history of stroke or TIA symptoms, denies any cardiac problems.  Pt denies new medical problems or recent surgeries.  Father died of clotting disorder during surgery for AAA, as pt describes, sounds like possibly DIC.  Pt's brother also has a AAA, had an EVAR placed which became infected, 10 hour surgery to correct this.   Pt Diabetic: No  Pt smoker: non-smoker  He denies any ETOH consumption   Past Medical History  Diagnosis Date  . AAA (abdominal aortic aneurysm)   . History of vertigo   . H/O hypercholesterolemia    Past Surgical History  Procedure Laterality Date  . Back surgery  1984    ruptured disc  . Hernia repair  2005  . Cardiovascular stress test  Jan. 14,2014  . Spine surgery     Social History History   Social History  . Marital Status: Married    Spouse Name: N/A    Number of Children: N/A  . Years of Education: N/A   Occupational History  . Not on file.   Social History Main Topics  . Smoking status: Never Smoker   . Smokeless tobacco: Never Used  . Alcohol Use: No  . Drug Use: No  . Sexual Activity: Not on file   Other Topics Concern  . Not on file   Social History Narrative  . No narrative on file   Family History Family History  Problem Relation Age of Onset  . AAA (abdominal aortic aneurysm) Father   . Heart disease Father   . AAA (abdominal aortic aneurysm) Brother   . Heart disease  Brother     Current Outpatient Prescriptions on File Prior to Visit  Medication Sig Dispense Refill  . atorvastatin (LIPITOR) 20 MG tablet Take 1 tablet by mouth Daily.      . Multiple Vitamins-Minerals (CENTRUM SILVER PO) Take by mouth daily.      . aspirin EC 81 MG tablet Take 1 tablet (81 mg total) by mouth daily.      . gabapentin (NEURONTIN) 100 MG capsule Take 1 capsule (100 mg total) by mouth at bedtime.  30 capsule  0  . omeprazole (PRILOSEC OTC) 20 MG tablet Take 20 mg by mouth daily.      . PredniSONE 10 MG KIT 12 day dose pack po  1 kit  0   No current facility-administered medications on file prior to visit.   No Known Allergies  ROS: See HPI for pertinent positives and negatives.  Physical Examination  Filed Vitals:   09/20/13 0911  BP: 157/84  Pulse: 43  Resp: 14  Height: 5' 11" (1.803 m)  Weight: 157 lb (71.215 kg)  SpO2: 98%   Body mass index is 21.91 kg/(m^2).  General: A&O x 3, WD, fit appearing.  Pulmonary: Sym exp, good air movt, CTAB, no rales, rhonchi, or wheezing.  Cardiac: RRR, Nl S1, S2, no Murmurs, rubs or gallops, rare premature beats.     Carotid Bruits  Left  Right    Negative  Negative    Aorta is is palpable  Radial pulses are 3+ palpable and =   VASCULAR EXAM:  LE Pulses  LEFT  RIGHT   FEMORAL  3+palpable  3+palpable   POPLITEAL  2+ palpable  2+palpable   POSTERIOR TIBIAL  1+palpable  2+palpable   DORSALIS PEDIS  ANTERIOR TIBIAL  2+palpable  2+palpable    Gastrointestinal: soft, NTND, -G/R, - HSM, - masses, - CVAT B.  Musculoskeletal: M/S 5/5 throughout, Extremities without ischemic changes.  Neurologic: CN 2-12 intact, Pain and light touch intact in extremities, Motor exam as listed above.   Non-Invasive Vascular Imaging  AAA Duplex (09/20/2013)  ABDOMINAL AORTA DUPLEX EVALUATION    INDICATION: Evaluation of abdominal aorta.    PREVIOUS INTERVENTION(S):     DUPLEX EXAM:     LOCATION DIAMETER AP (cm) DIAMETER TRANSVERSE  (cm) VELOCITIES (cm/sec)  Aorta Proximal 2.77 2.78 49  Aorta Mid 2.24 2.20 59  Aorta Distal 4.17 4.14 58  Right Common Iliac Artery 1.45 1.41 98  Left Common Iliac Artery 1.45 1.48 96    Previous max aortic diameter:  3.9 x 4.0 Date: 03/23/2013     ADDITIONAL FINDINGS:     IMPRESSION: Patent abdominal aorta with a maximum diameter of 4.17 x 4.14 cm.     Compared to the previous exam:  Slight increase in the maximum diameter when compared to the previous exam.     Medical Decision Making  The patient is a 79 y.o. male who presents with asymptomatic AAA with no significant increase in size.   Based on this patient's exam and diagnostic studies, and two immediate family members that had AAA, the patient will follow up in 6 months  with the following studies: AAA Duplex.  Consideration for repair of AAA would be made when the size is 5.5 cm, growth > 1 cm/yr, and symptomatic status.  I emphasized the importance of maximal medical management including strict control of blood pressure, blood glucose, and lipid levels, antiplatelet agents, obtaining regular exercise, and continued cessation of smoking.   The patient was given information about AAA including signs, symptoms, treatment, and how to minimize the risk of enlargement and rupture of aneurysms.    The patient was advised to call 911 should the patient experience sudden onset abdominal or back pain.   Thank you for allowing us to participate in this patient's care.  Suzanne Nickel, RN, MSN, FNP-C [facility] [place] Office: [phone]  Clinic Physician: [place]  09/20/2013, 9:13 AM      

## 2013-09-20 NOTE — Addendum Note (Signed)
Addended by: Mena Goes on: 09/20/2013 03:41 PM   Modules accepted: Orders

## 2013-09-20 NOTE — Patient Instructions (Signed)
Abdominal Aortic Aneurysm An aneurysm is a weakened or damaged part of an artery wall that bulges from the normal force of blood pumping through the body. An abdominal aortic aneurysm is an aneurysm that occurs in the lower part of the aorta, the main artery of the body.  The major concern with an abdominal aortic aneurysm is that it can enlarge and burst (rupture) or blood can flow between the layers of the wall of the aorta through a tear (aorticdissection). Both of these conditions can cause bleeding inside the body and can be life threatening unless diagnosed and treated promptly. CAUSES  The exact cause of an abdominal aortic aneurysm is unknown. Some contributing factors are:   A hardening of the arteries caused by the buildup of fat and other substances in the lining of a blood vessel (arteriosclerosis).  Inflammation of the walls of an artery (arteritis).   Connective tissue diseases, such as Marfan syndrome.   Abdominal trauma.   An infection, such as syphilis or staphylococcus, in the wall of the aorta (infectious aortitis) caused by bacteria. RISK FACTORS  Risk factors that contribute to an abdominal aortic aneurysm may include:  Age older than 60 years.   High blood pressure (hypertension).  Male gender.  Ethnicity (white race).  Obesity.  Family history of aneurysm (first degree relatives only).  Tobacco use. PREVENTION  The following healthy lifestyle habits may help decrease your risk of abdominal aortic aneurysm:  Quitting smoking. Smoking can raise your blood pressure and cause arteriosclerosis.  Limiting or avoiding alcohol.  Keeping your blood pressure, blood sugar level, and cholesterol levels within normal limits.  Decreasing your salt intake. In somepeople, too much salt can raise blood pressure and increase your risk of abdominal aortic aneurysm.  Eating a diet low in saturated fats and cholesterol.  Increasing your fiber intake by including  whole grains, vegetables, and fruits in your diet. Eating these foods may help lower blood pressure.  Maintaining a healthy weight.  Staying physically active and exercising regularly. SYMPTOMS  The symptoms of abdominal aortic aneurysm may vary depending on the size and rate of growth of the aneurysm.Most grow slowly and do not have any symptoms. When symptoms do occur, they may include:  Pain (abdomen, side, lower back, or groin). The pain may vary in intensity. A sudden onset of severe pain may indicate that the aneurysm has ruptured.  Feeling full after eating only small amounts of food.  Nausea or vomiting or both.  Feeling a pulsating lump in the abdomen.  Feeling faint or passing out. DIAGNOSIS  Since most unruptured abdominal aortic aneurysms have no symptoms, they are often discovered during diagnostic exams for other conditions. An aneurysm may be found during the following procedures:  Ultrasonography (A one-time screening for abdominal aortic aneurysm by ultrasonography is also recommended for all men aged 65-75 years who have ever smoked).  X-ray exams.  A computed tomography (CT).  Magnetic resonance imaging (MRI).  Angiography or arteriography. TREATMENT  Treatment of an abdominal aortic aneurysm depends on the size of your aneurysm, your age, and risk factors for rupture. Medication to control blood pressure and pain may be used to manage aneurysms smaller than 6 cm. Regular monitoring for enlargement may be recommended by your caregiver if:  The aneurysm is 3-4 cm in size (an annual ultrasonography may be recommended).  The aneurysm is 4-4.5 cm in size (an ultrasonography every 6 months may be recommended).  The aneurysm is larger than 4.5 cm in   size (your caregiver may ask that you be examined by a vascular surgeon). If your aneurysm is larger than 6 cm, surgical repair may be recommended. There are two main methods for repair of an aneurysm:   Endovascular  repair (a minimally invasive surgery). This is done most often.  Open repair. This method is used if an endovascular repair is not possible. Document Released: 11/27/2004 Document Revised: 06/14/2012 Document Reviewed: 03/19/2012 Exit[employer] Patient Information 2015 [employer]. This information is not intended to replace advice given to you by your health care provider. Make sure you discuss any questions you have with your health care provider.  

## 2014-01-20 ENCOUNTER — Telehealth: Payer: Self-pay | Admitting: Vascular Surgery

## 2014-01-20 NOTE — Telephone Encounter (Signed)
-----   Message from [facility], RVT sent at 01/20/2014  8:31 AM EST ----- Regarding: RE: AAA Duplex scheduled 03/21/14 Keep the appointment as scheduled per our protocol.  Thank you,  Juliann Pulse ----- Message -----    From: Rufina Falco    Sent: 01/18/2014   1:42 PM      To: Kathi Ludwig Nickel, NP, # Subject: AAA Duplex scheduled 03/21/14                   As per the protocol, Mr. Carrier should return in 1 year for his AAA duplex.  Please let me know if I should keep the appointment scheduled as is or make changes.   Rip Harbour

## 2014-03-08 DIAGNOSIS — H6123 Impacted cerumen, bilateral: Secondary | ICD-10-CM | POA: Diagnosis not present

## 2014-03-08 DIAGNOSIS — J322 Chronic ethmoidal sinusitis: Secondary | ICD-10-CM | POA: Diagnosis not present

## 2014-03-08 DIAGNOSIS — J040 Acute laryngitis: Secondary | ICD-10-CM | POA: Diagnosis not present

## 2014-03-08 DIAGNOSIS — J320 Chronic maxillary sinusitis: Secondary | ICD-10-CM | POA: Diagnosis not present

## 2014-03-20 ENCOUNTER — Encounter: Payer: Self-pay | Admitting: Family

## 2014-03-21 ENCOUNTER — Encounter: Payer: Self-pay | Admitting: Family

## 2014-03-21 ENCOUNTER — Ambulatory Visit (INDEPENDENT_AMBULATORY_CARE_PROVIDER_SITE_OTHER): Payer: Medicare Other | Admitting: Family

## 2014-03-21 ENCOUNTER — Ambulatory Visit (HOSPITAL_COMMUNITY)
Admission: RE | Admit: 2014-03-21 | Discharge: 2014-03-21 | Disposition: A | Discharge status: Home / Self Care | Payer: Medicare Other | Source: Ambulatory Visit | Attending: Family | Admitting: Family

## 2014-03-21 VITALS — BP 141/83 | HR 48 | Resp 14 | Ht 71.0 in | Wt 159.0 lb

## 2014-03-21 DIAGNOSIS — R0989 Other specified symptoms and signs involving the circulatory and respiratory systems: Secondary | ICD-10-CM | POA: Diagnosis not present

## 2014-03-21 DIAGNOSIS — Z48812 Encounter for surgical aftercare following surgery on the circulatory system: Secondary | ICD-10-CM

## 2014-03-21 DIAGNOSIS — I714 Abdominal aortic aneurysm, without rupture, unspecified: Secondary | ICD-10-CM

## 2014-03-21 DIAGNOSIS — Z8249 Family history of ischemic heart disease and other diseases of the circulatory system: Secondary | ICD-10-CM | POA: Diagnosis not present

## 2014-03-21 NOTE — Addendum Note (Signed)
Addended by: Mena Goes on: 03/21/2014 03:41 PM   Modules accepted: Orders

## 2014-03-21 NOTE — Patient Instructions (Signed)
Abdominal Aortic Aneurysm An aneurysm is a weakened or damaged part of an artery wall that bulges from the normal force of blood pumping through the body. An abdominal aortic aneurysm is an aneurysm that occurs in the lower part of the aorta, the main artery of the body.  The major concern with an abdominal aortic aneurysm is that it can enlarge and burst (rupture) or blood can flow between the layers of the wall of the aorta through a tear (aorticdissection). Both of these conditions can cause bleeding inside the body and can be life threatening unless diagnosed and treated promptly. CAUSES  The exact cause of an abdominal aortic aneurysm is unknown. Some contributing factors are:   A hardening of the arteries caused by the buildup of fat and other substances in the lining of a blood vessel (arteriosclerosis).  Inflammation of the walls of an artery (arteritis).   Connective tissue diseases, such as Marfan syndrome.   Abdominal trauma.   An infection, such as syphilis or staphylococcus, in the wall of the aorta (infectious aortitis) caused by bacteria. RISK FACTORS  Risk factors that contribute to an abdominal aortic aneurysm may include:  Age older than 60 years.   High blood pressure (hypertension).  Male gender.  Ethnicity (white race).  Obesity.  Family history of aneurysm (first degree relatives only).  Tobacco use. PREVENTION  The following healthy lifestyle habits may help decrease your risk of abdominal aortic aneurysm:  Quitting smoking. Smoking can raise your blood pressure and cause arteriosclerosis.  Limiting or avoiding alcohol.  Keeping your blood pressure, blood sugar level, and cholesterol levels within normal limits.  Decreasing your salt intake. In somepeople, too much salt can raise blood pressure and increase your risk of abdominal aortic aneurysm.  Eating a diet low in saturated fats and cholesterol.  Increasing your fiber intake by including  whole grains, vegetables, and fruits in your diet. Eating these foods may help lower blood pressure.  Maintaining a healthy weight.  Staying physically active and exercising regularly. SYMPTOMS  The symptoms of abdominal aortic aneurysm may vary depending on the size and rate of growth of the aneurysm.Most grow slowly and do not have any symptoms. When symptoms do occur, they may include:  Pain (abdomen, side, lower back, or groin). The pain may vary in intensity. A sudden onset of severe pain may indicate that the aneurysm has ruptured.  Feeling full after eating only small amounts of food.  Nausea or vomiting or both.  Feeling a pulsating lump in the abdomen.  Feeling faint or passing out. DIAGNOSIS  Since most unruptured abdominal aortic aneurysms have no symptoms, they are often discovered during diagnostic exams for other conditions. An aneurysm may be found during the following procedures:  Ultrasonography (A one-time screening for abdominal aortic aneurysm by ultrasonography is also recommended for all men aged 65-75 years who have ever smoked).  X-ray exams.  A computed tomography (CT).  Magnetic resonance imaging (MRI).  Angiography or arteriography. TREATMENT  Treatment of an abdominal aortic aneurysm depends on the size of your aneurysm, your age, and risk factors for rupture. Medication to control blood pressure and pain may be used to manage aneurysms smaller than 6 cm. Regular monitoring for enlargement may be recommended by your caregiver if:  The aneurysm is 3-4 cm in size (an annual ultrasonography may be recommended).  The aneurysm is 4-4.5 cm in size (an ultrasonography every 6 months may be recommended).  The aneurysm is larger than 4.5 cm in   size (your caregiver may ask that you be examined by a vascular surgeon). If your aneurysm is larger than 6 cm, surgical repair may be recommended. There are two main methods for repair of an aneurysm:   Endovascular  repair (a minimally invasive surgery). This is done most often.  Open repair. This method is used if an endovascular repair is not possible. Document Released: 11/27/2004 Document Revised: 06/14/2012 Document Reviewed: 03/19/2012 Exit[employer] Patient Information 2015 [employer]. This information is not intended to replace advice given to you by your health care provider. Make sure you discuss any questions you have with your health care provider.  

## 2014-03-21 NOTE — Progress Notes (Signed)
[facility] [place]  Established Abdominal Aortic Aneurysm  History of Present Illness  Charles Stephenson is a 80 y.o. ([birth year]) male  patient of Dr. Donnetta Hutching who presents with chief complaint: follow up for AAA.  His AAA was incidentally found during routine physical exam by palpation.  Previous studies demonstrate an AAA measuring 4.0 cm on 03/23/13, 3.5 cm in January 2011 as earliest on file (review of records). The patient does not have back or abdominal pain, even though he has had back surgery.  The patient denies claudication in legs with walking, he walks 3-4 miles.  The patient denies history of stroke or TIA symptoms, denies any cardiac problems.  Pt denies new medical problems or recent surgeries.  Father died of clotting disorder during surgery for AAA, as pt describes, sounds like possibly DIC.  Pt's brother also has a AAA, had an EVAR placed which became infected, 10 hour surgery to correct this.  He no longer takes a daily ASA as directed by his PCP, pt states he does not need it. Dr. Aundra Dubin has ordered cardiac stress testing as part of pt's routine exam, pt denies any personal history of CAD.  Pt Diabetic: No  Pt smoker: non-smoker  He denies any ETOH consumption  Past Medical History  Diagnosis Date  . AAA (abdominal aortic aneurysm)   . History of vertigo   . H/O hypercholesterolemia    Past Surgical History  Procedure Laterality Date  . Back surgery  1984    ruptured disc  . Hernia repair  2005  . Cardiovascular stress test  Jan. 14,2014  . Spine surgery     Social History History   Social History  . Marital Status: Married    Spouse Name: N/A    Number of Children: N/A  . Years of Education: N/A   Occupational History  . Not on file.   Social History Main Topics  . Smoking status: Never Smoker   . Smokeless tobacco: Never Used  . Alcohol Use: No  . Drug Use: No  . Sexual Activity: Not on file   Other Topics Concern   . Not on file   Social History Narrative   Family History Family History  Problem Relation Age of Onset  . AAA (abdominal aortic aneurysm) Father   . Heart disease Father   . AAA (abdominal aortic aneurysm) Brother   . Heart disease Brother     Current Outpatient Prescriptions on File Prior to Visit  Medication Sig Dispense Refill  . atorvastatin (LIPITOR) 20 MG tablet Take 1 tablet by mouth Daily.    . Multiple Vitamins-Minerals (CENTRUM SILVER PO) Take by mouth daily.    Marland Kitchen aspirin EC 81 MG tablet Take 1 tablet (81 mg total) by mouth daily.    Marland Kitchen gabapentin (NEURONTIN) 100 MG capsule Take 1 capsule (100 mg total) by mouth at bedtime. (Patient not taking: Reported on 03/21/2014) 30 capsule 0  . omeprazole (PRILOSEC OTC) 20 MG tablet Take 20 mg by mouth daily.    . PredniSONE 10 MG KIT 12 day dose pack po (Patient not taking: Reported on 03/21/2014) 1 kit 0   No current facility-administered medications on file prior to visit.   No Known Allergies  ROS: See HPI for pertinent positives and negatives.  Physical Examination  Filed Vitals:   03/21/14 0915  BP: 141/83  Pulse: 48  Resp: 14  Height: 5' 11"  (1.803 m)  Weight: 159 lb (72.122 kg)  SpO2: 100%  Body mass index is 22.19 kg/(m^2).  General: A&O x 3, WD, fit appearing.  Pulmonary: Sym exp, good air movt, CTAB, no rales, rhonchi, or wheezing.  Cardiac: RRR, Nl S1, S2, no murmur detected.   Carotid Bruits  Left  Right    Negative  Negative    Aorta is is palpable  Radial pulses are 3+ palpable and =   VASCULAR EXAM:  LE Pulses  LEFT  RIGHT   FEMORAL  3+palpable  3+palpable   POPLITEAL  2+ palpable  3+palpable   POSTERIOR TIBIAL  1+palpable  1+palpable   DORSALIS PEDIS  ANTERIOR TIBIAL  2+palpable  2+palpable    Gastrointestinal: soft, NTND, -G/R, - HSM, - palpable masses, - CVAT B.  Musculoskeletal: M/S 5/5 throughout, Extremities without ischemic changes.  Neurologic: CN 2-12  intact, Pain and light touch intact in extremities, Motor exam as listed above.    Non-Invasive Vascular Imaging  AAA Duplex (03/21/2014) ABDOMINAL AORTA DUPLEX EVALUATION    INDICATION: Evaluation of abdominal aorta.    PREVIOUS INTERVENTION(S):     DUPLEX EXAM:     LOCATION DIAMETER AP (cm) DIAMETER TRANSVERSE (cm) VELOCITIES (cm/sec)  Aorta Proximal 2.97 3.26 63  Aorta Mid 2.32 2.26 55  Aorta Distal 4.15 4.23 46  Right Common Iliac Artery 1.43 1.40 77  Left Common Iliac Artery Not Visualized  Not Visualized      Previous max aortic diameter:  4.17 x 4.14 Date: 09/20/2013  ADDITIONAL FINDINGS:     IMPRESSION: Patent abdominal aortic aneurysm measuring approximately 4.15 x 4.23 cm in diameter.     Compared to the previous exam:  No significant change in comparison to the last exam on 09/20/2013.     Medical Decision Making  The patient is a 80 y.o. male who presents with asymptomatic AAA with no significant increase in size. Earliest record of AAA size on file is 3.5 cm in January 2011. Pt has minimal or no modifiable risk factors for AAA growth, he does have the non modifiable risk factors of his father and brother having AAA's but both of them smoked, they did not have DM. Pt has a prominent right popliteal pulse at 3+ and 2+ palpable left popliteal pulse, will check bilateral popliteal arterial Duplex on pt's return in 6 months for AAA Duplex.  Face to face time with patient was 25 minutes. Over 50% of this time was spent on counseling and coordination of care.    Based on this patient's exam and diagnostic studies, the patient will follow up in 6 months  with the following studies: AAA Duplex.  Consideration for repair of AAA would be made when the size approaches 4.8 or 5.0 cm, growth > 1 cm/yr, and symptomatic status.  I emphasized the importance of maximal medical management including strict control of blood pressure, blood glucose, and lipid levels, antiplatelet  agents, obtaining regular exercise, and continued cessation of smoking.   The patient was given information about AAA including signs, symptoms, treatment, and how to minimize the risk of enlargement and rupture of aneurysms.    The patient was advised to call 911 should the patient experience sudden onset abdominal or back pain.   Thank you for allowing [place] to participate in this patient's care.  Clemon Chambers, RN, MSN, FNP-C [facility] [place] Office: [phone]  Clinic Physician: Kellie Simmering  03/21/2014, 9:15 AM

## 2014-04-19 DIAGNOSIS — K5730 Diverticulosis of large intestine without perforation or abscess without bleeding: Secondary | ICD-10-CM | POA: Diagnosis not present

## 2014-04-19 DIAGNOSIS — E780 Pure hypercholesterolemia: Secondary | ICD-10-CM | POA: Diagnosis not present

## 2014-04-19 DIAGNOSIS — Z125 Encounter for screening for malignant neoplasm of prostate: Secondary | ICD-10-CM | POA: Diagnosis not present

## 2014-04-19 DIAGNOSIS — Z0000 Encounter for general adult medical examination without abnormal findings: Secondary | ICD-10-CM | POA: Diagnosis not present

## 2014-04-19 DIAGNOSIS — K649 Unspecified hemorrhoids: Secondary | ICD-10-CM | POA: Diagnosis not present

## 2014-04-19 DIAGNOSIS — Z79899 Other long term (current) drug therapy: Secondary | ICD-10-CM | POA: Diagnosis not present

## 2014-04-20 DIAGNOSIS — Z0000 Encounter for general adult medical examination without abnormal findings: Secondary | ICD-10-CM | POA: Diagnosis not present

## 2014-04-20 DIAGNOSIS — E780 Pure hypercholesterolemia: Secondary | ICD-10-CM | POA: Diagnosis not present

## 2014-04-20 DIAGNOSIS — K5730 Diverticulosis of large intestine without perforation or abscess without bleeding: Secondary | ICD-10-CM | POA: Diagnosis not present

## 2014-04-20 DIAGNOSIS — K649 Unspecified hemorrhoids: Secondary | ICD-10-CM | POA: Diagnosis not present

## 2014-04-20 DIAGNOSIS — I714 Abdominal aortic aneurysm, without rupture: Secondary | ICD-10-CM | POA: Diagnosis not present

## 2014-07-18 DIAGNOSIS — J301 Allergic rhinitis due to pollen: Secondary | ICD-10-CM | POA: Diagnosis not present

## 2014-07-18 DIAGNOSIS — J322 Chronic ethmoidal sinusitis: Secondary | ICD-10-CM | POA: Diagnosis not present

## 2014-07-18 DIAGNOSIS — J320 Chronic maxillary sinusitis: Secondary | ICD-10-CM | POA: Diagnosis not present

## 2014-07-21 ENCOUNTER — Ambulatory Visit
Admission: RE | Admit: 2014-07-21 | Discharge: 2014-07-21 | Disposition: A | Discharge status: Home / Self Care | Payer: Medicare Other | Source: Ambulatory Visit | Attending: Otolaryngology | Admitting: Otolaryngology

## 2014-07-21 ENCOUNTER — Other Ambulatory Visit: Payer: Self-pay | Admitting: Otolaryngology

## 2014-07-21 DIAGNOSIS — J410 Simple chronic bronchitis: Secondary | ICD-10-CM | POA: Diagnosis not present

## 2014-07-21 DIAGNOSIS — J320 Chronic maxillary sinusitis: Secondary | ICD-10-CM | POA: Diagnosis not present

## 2014-07-21 DIAGNOSIS — R059 Cough, unspecified: Secondary | ICD-10-CM

## 2014-07-21 DIAGNOSIS — R05 Cough: Secondary | ICD-10-CM

## 2014-07-21 DIAGNOSIS — J322 Chronic ethmoidal sinusitis: Secondary | ICD-10-CM | POA: Diagnosis not present

## 2014-07-21 DIAGNOSIS — R0602 Shortness of breath: Secondary | ICD-10-CM

## 2014-07-21 IMAGING — CR DG CHEST 2V
2 series · 2 of 2 positions shown · non-contrast
Comparison: None.

CLINICAL DATA: Several day history of cough congestion and chest
tightness, nonsmoker. History of hyperlipidemia and abdominal aortic
aneurysm.

EXAM:
CHEST  2 VIEW

[w chest pa]
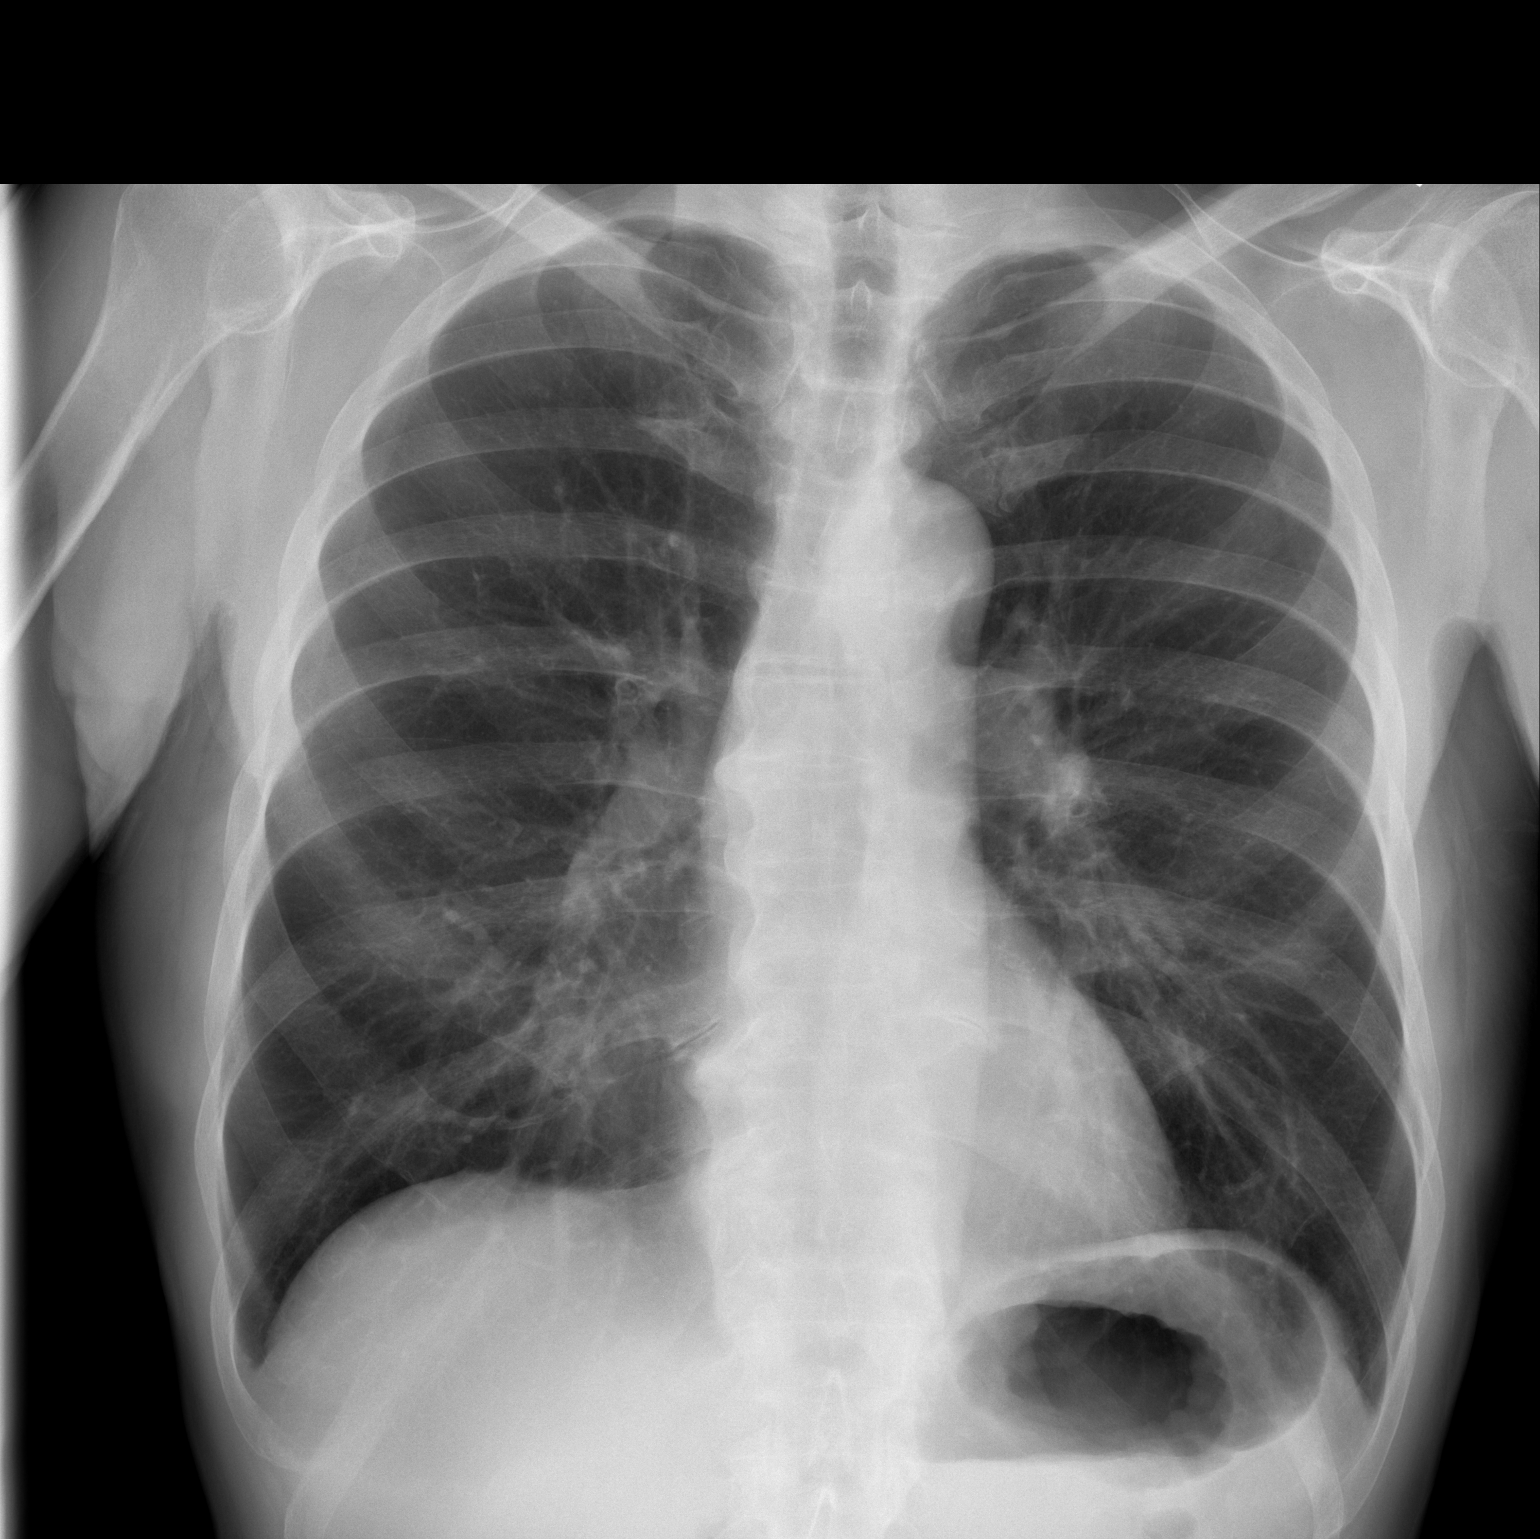

[w chest lat]
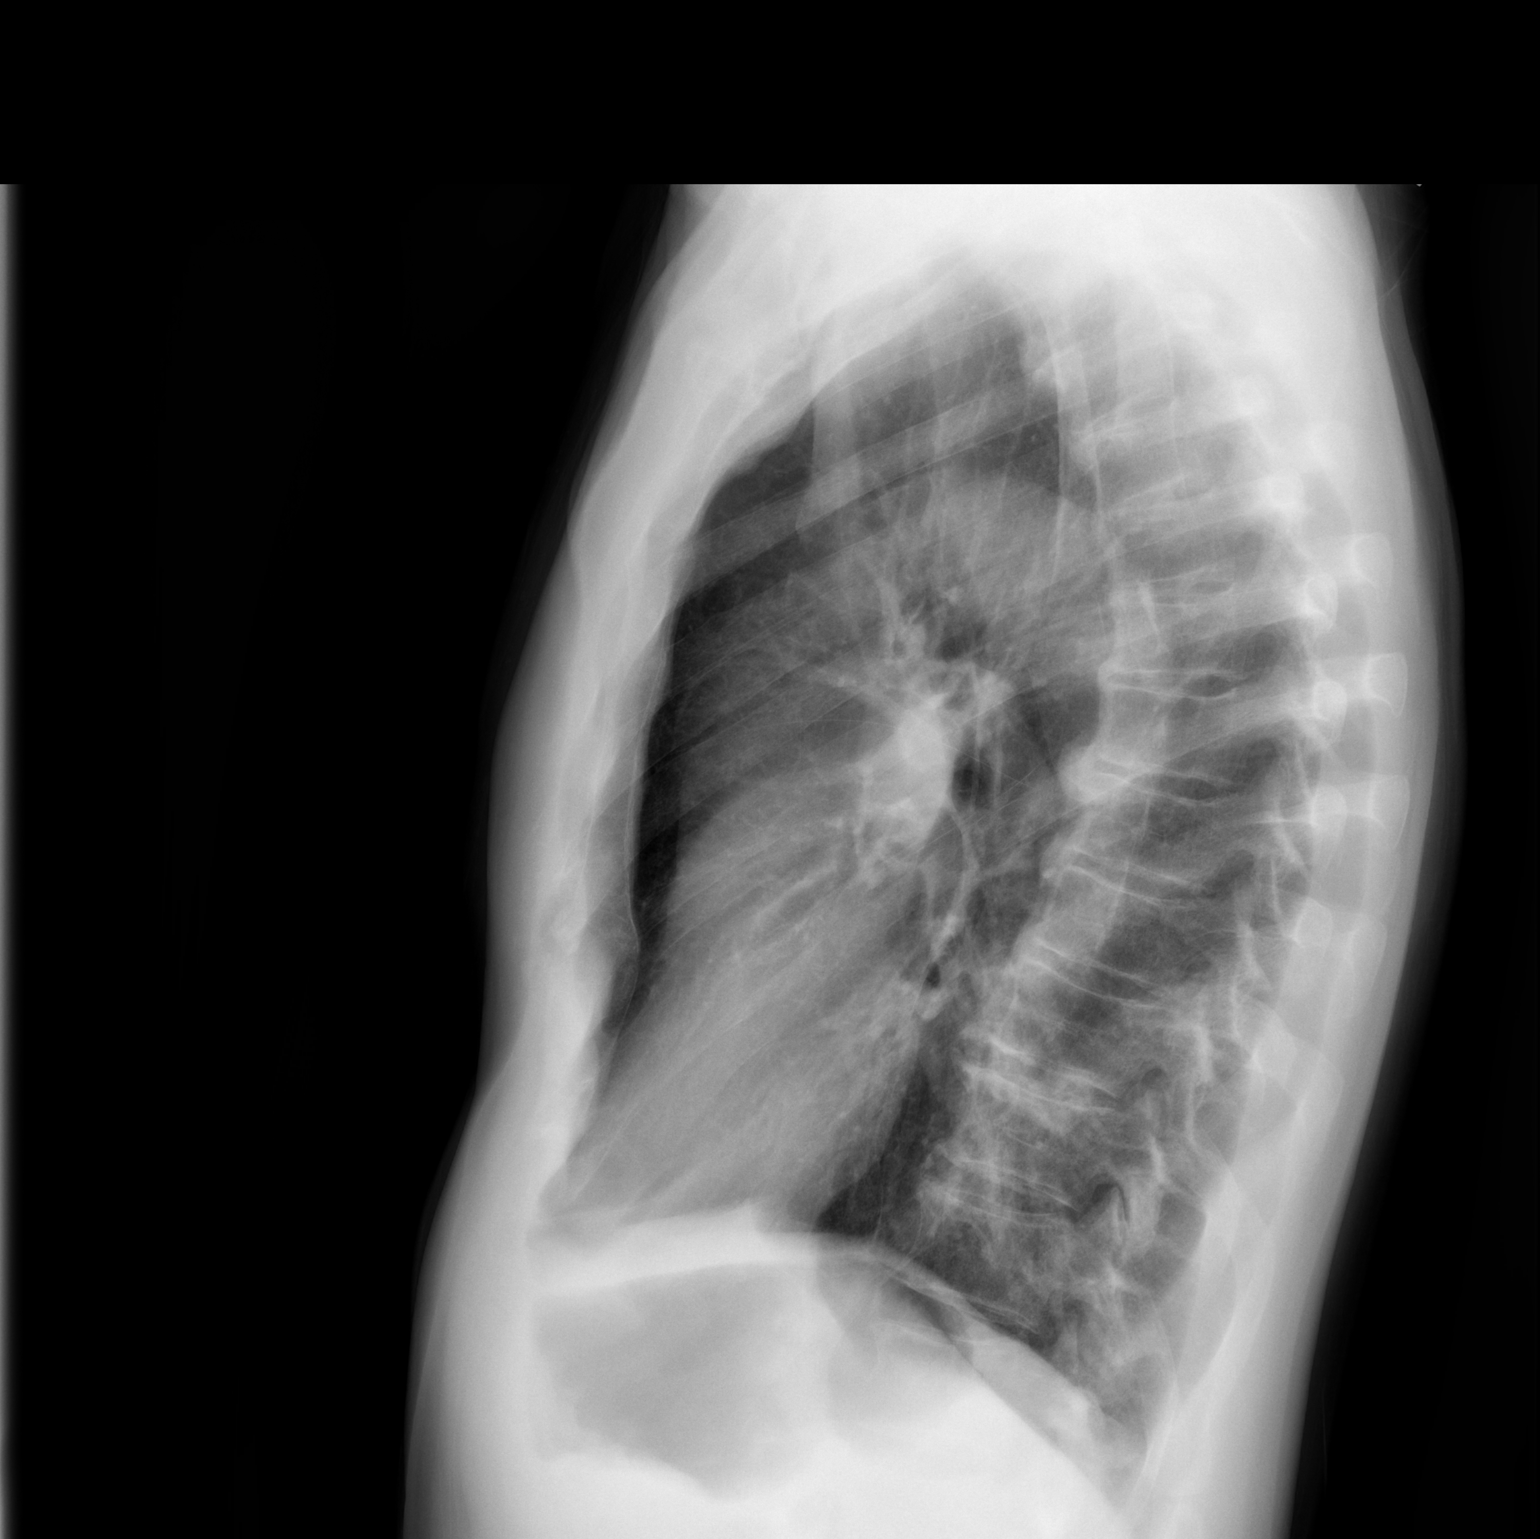

[2 of 2 positions shown; findings below may reference images not displayed]

FINDINGS: The lungs are mildly hyperinflated. There is no focal infiltrate.
There is no pleural effusion. The heart and pulmonary vascularity
are normal. The mediastinum is normal in width. The bony thorax
exhibits no acute abnormality. There is mild multilevel degenerative
disc disease of the thoracic spine.
IMPRESSION: COPD. There is no pneumonia nor other acute cardiopulmonary disease.

## 2014-07-25 DIAGNOSIS — J322 Chronic ethmoidal sinusitis: Secondary | ICD-10-CM | POA: Diagnosis not present

## 2014-07-25 DIAGNOSIS — J320 Chronic maxillary sinusitis: Secondary | ICD-10-CM | POA: Diagnosis not present

## 2014-08-22 DIAGNOSIS — L570 Actinic keratosis: Secondary | ICD-10-CM | POA: Diagnosis not present

## 2014-08-22 DIAGNOSIS — L821 Other seborrheic keratosis: Secondary | ICD-10-CM | POA: Diagnosis not present

## 2014-08-22 DIAGNOSIS — C44311 Basal cell carcinoma of skin of nose: Secondary | ICD-10-CM | POA: Diagnosis not present

## 2014-08-28 ENCOUNTER — Other Ambulatory Visit: Payer: Self-pay

## 2014-09-05 DIAGNOSIS — C44311 Basal cell carcinoma of skin of nose: Secondary | ICD-10-CM | POA: Diagnosis not present

## 2014-09-15 ENCOUNTER — Encounter: Payer: Self-pay | Admitting: Family

## 2014-09-19 ENCOUNTER — Ambulatory Visit (HOSPITAL_COMMUNITY)
Admission: RE | Admit: 2014-09-19 | Discharge: 2014-09-19 | Disposition: A | Discharge status: Home / Self Care | Payer: Medicare Other | Source: Ambulatory Visit | Attending: Family | Admitting: Family

## 2014-09-19 ENCOUNTER — Ambulatory Visit (INDEPENDENT_AMBULATORY_CARE_PROVIDER_SITE_OTHER): Payer: Medicare Other | Admitting: Family

## 2014-09-19 ENCOUNTER — Encounter: Payer: Self-pay | Admitting: Family

## 2014-09-19 ENCOUNTER — Other Ambulatory Visit: Payer: Self-pay | Admitting: Family

## 2014-09-19 VITALS — BP 149/77 | HR 43 | Temp 97.0°F | Resp 14 | Ht 71.0 in | Wt 154.9 lb

## 2014-09-19 DIAGNOSIS — R0989 Other specified symptoms and signs involving the circulatory and respiratory systems: Secondary | ICD-10-CM

## 2014-09-19 DIAGNOSIS — Z8249 Family history of ischemic heart disease and other diseases of the circulatory system: Secondary | ICD-10-CM

## 2014-09-19 DIAGNOSIS — I714 Abdominal aortic aneurysm, without rupture, unspecified: Secondary | ICD-10-CM

## 2014-09-19 NOTE — Patient Instructions (Signed)
Abdominal Aortic Aneurysm An aneurysm is a weakened or damaged part of an artery wall that bulges from the normal force of blood pumping through the body. An abdominal aortic aneurysm is an aneurysm that occurs in the lower part of the aorta, the main artery of the body.  The major concern with an abdominal aortic aneurysm is that it can enlarge and burst (rupture) or blood can flow between the layers of the wall of the aorta through a tear (aorticdissection). Both of these conditions can cause bleeding inside the body and can be life threatening unless diagnosed and treated promptly. CAUSES  The exact cause of an abdominal aortic aneurysm is unknown. Some contributing factors are:   A hardening of the arteries caused by the buildup of fat and other substances in the lining of a blood vessel (arteriosclerosis).  Inflammation of the walls of an artery (arteritis).   Connective tissue diseases, such as Marfan syndrome.   Abdominal trauma.   An infection, such as syphilis or staphylococcus, in the wall of the aorta (infectious aortitis) caused by bacteria. RISK FACTORS  Risk factors that contribute to an abdominal aortic aneurysm may include:  Age older than 60 years.   High blood pressure (hypertension).  Male gender.  Ethnicity (white race).  Obesity.  Family history of aneurysm (first degree relatives only).  Tobacco use. PREVENTION  The following healthy lifestyle habits may help decrease your risk of abdominal aortic aneurysm:  Quitting smoking. Smoking can raise your blood pressure and cause arteriosclerosis.  Limiting or avoiding alcohol.  Keeping your blood pressure, blood sugar level, and cholesterol levels within normal limits.  Decreasing your salt intake. In somepeople, too much salt can raise blood pressure and increase your risk of abdominal aortic aneurysm.  Eating a diet low in saturated fats and cholesterol.  Increasing your fiber intake by including  whole grains, vegetables, and fruits in your diet. Eating these foods may help lower blood pressure.  Maintaining a healthy weight.  Staying physically active and exercising regularly. SYMPTOMS  The symptoms of abdominal aortic aneurysm may vary depending on the size and rate of growth of the aneurysm.Most grow slowly and do not have any symptoms. When symptoms do occur, they may include:  Pain (abdomen, side, lower back, or groin). The pain may vary in intensity. A sudden onset of severe pain may indicate that the aneurysm has ruptured.  Feeling full after eating only small amounts of food.  Nausea or vomiting or both.  Feeling a pulsating lump in the abdomen.  Feeling faint or passing out. DIAGNOSIS  Since most unruptured abdominal aortic aneurysms have no symptoms, they are often discovered during diagnostic exams for other conditions. An aneurysm may be found during the following procedures:  Ultrasonography (A one-time screening for abdominal aortic aneurysm by ultrasonography is also recommended for all men aged 65-75 years who have ever smoked).  X-ray exams.  A computed tomography (CT).  Magnetic resonance imaging (MRI).  Angiography or arteriography. TREATMENT  Treatment of an abdominal aortic aneurysm depends on the size of your aneurysm, your age, and risk factors for rupture. Medication to control blood pressure and pain may be used to manage aneurysms smaller than 6 cm. Regular monitoring for enlargement may be recommended by your caregiver if:  The aneurysm is 3-4 cm in size (an annual ultrasonography may be recommended).  The aneurysm is 4-4.5 cm in size (an ultrasonography every 6 months may be recommended).  The aneurysm is larger than 4.5 cm in   size (your caregiver may ask that you be examined by a vascular surgeon). If your aneurysm is larger than 6 cm, surgical repair may be recommended. There are two main methods for repair of an aneurysm:   Endovascular  repair (a minimally invasive surgery). This is done most often.  Open repair. This method is used if an endovascular repair is not possible. Document Released: 11/27/2004 Document Revised: 06/14/2012 Document Reviewed: 03/19/2012 Exit[employer] Patient Information 2015 [employer]. This information is not intended to replace advice given to you by your health care provider. Make sure you discuss any questions you have with your health care provider.  

## 2014-09-19 NOTE — Progress Notes (Signed)
[facility] [place]  Established Abdominal Aortic Aneurysm  History of Present Illness  Charles Stephenson is a 80 y.o. (28-Dec-1934) male patient of Dr. Donnetta Hutching who presents with chief complaint: follow up for AAA.  His AAA was incidentally found during routine physical exam by palpation.  Previous studies demonstrate an AAA measuring 4.0 cm on 03/23/13, 3.5 cm in January 2011 as earliest on file (review of records). The patient does not have back or abdominal pain, even though he has had back surgery.  The patient denies claudication in legs with walking, he walks 3 miles most days/week.  The patient denies history of stroke or TIA symptoms, denies any cardiac problems.  Pt denies new medical problems or recent surgeries.  Father died of clotting disorder during surgery for AAA, as pt describes, sounds like possibly DIC.  Pt's brother also has a AAA, had an EVAR placed which became infected, 10 hour surgery to correct this; states his brother was obese, smoked, was in poor health.  He no longer takes a daily ASA as directed by his PCP, pt states he does not need it. Dr. Aundra Dubin has ordered cardiac stress testing as part of pt's routine exam, pt denies any personal history of CAD.  Pt Diabetic: No  Pt smoker: non-smoker  He denies any ETOH consumption    Past Medical History  Diagnosis Date  . AAA (abdominal aortic aneurysm)   . History of vertigo   . H/O hypercholesterolemia    Past Surgical History  Procedure Laterality Date  . Back surgery  1984    ruptured disc  . Hernia repair  2005  . Cardiovascular stress test  Jan. 14,2014  . Spine surgery    . Skin cancer excision  September 05, 2014    [facility]-  Nose   Social History History   Social History  . Marital Status: Married    Spouse Name: N/A  . Number of Children: N/A  . Years of Education: N/A   Occupational History  . Not on file.   Social History Main Topics  . Smoking status: Never Smoker    . Smokeless tobacco: Never Used  . Alcohol Use: No  . Drug Use: No  . Sexual Activity: Not on file   Other Topics Concern  . Not on file   Social History Narrative   Family History Family History  Problem Relation Age of Onset  . AAA (abdominal aortic aneurysm) Father   . Peripheral vascular disease Father     Aneurysm- Aorta  . AAA (abdominal aortic aneurysm) Brother   . Heart disease Brother 56    Before age 72  . Cancer Brother     Prostate  . Heart attack Brother   . Cancer Mother     Stomach    Current Outpatient Prescriptions on File Prior to Visit  Medication Sig Dispense Refill  . atorvastatin (LIPITOR) 20 MG tablet Take 1 tablet by mouth Daily.    . Multiple Vitamins-Minerals (CENTRUM SILVER PO) Take by mouth daily.    . psyllium (METAMUCIL SMOOTH TEXTURE) 28 % packet Take 1 packet by mouth daily.    Marland Kitchen aspirin EC 81 MG tablet Take 1 tablet (81 mg total) by mouth daily.    . cefUROXime (CEFTIN) 250 MG tablet Take 250 mg by mouth 2 (two) times daily.  0  . gabapentin (NEURONTIN) 100 MG capsule Take 1 capsule (100 mg total) by mouth at bedtime. (Patient not taking: Reported on 03/21/2014)  30 capsule 0  . lactobacillus acidophilus & bulgar (LACTINEX) chewable tablet   0  . omeprazole (PRILOSEC OTC) 20 MG tablet Take 20 mg by mouth daily.    . PredniSONE 10 MG KIT 12 day dose pack po (Patient not taking: Reported on 03/21/2014) 1 kit 0  . Sulfacetamide Sodium-Sulfur 10-5 % EMUL   0   No current facility-administered medications on file prior to visit.   No Known Allergies  ROS: See HPI for pertinent positives and negatives.  Physical Examination  Filed Vitals:   09/19/14 0920  BP: 149/77  Pulse: 43  Temp: 97 F (36.1 C)  TempSrc: Oral  Resp: 14  Height: 5' 11"  (1.803 m)  Weight: 154 lb 14.4 oz (70.262 kg)  SpO2: 99%   Body mass index is 21.61 kg/(m^2).  General: A&O x 3, WD, fit appearing.  Pulmonary: Sym exp, good air movt, CTAB, no rales, rhonchi,  or wheezing.  Cardiac: RRR, Nl S1, S2, no murmur detected.   Carotid Bruits  Left  Right    Negative  Negative    Aorta is is palpable  Radial pulses are 3+ palpable and =   VASCULAR EXAM:  LE Pulses  LEFT  RIGHT   FEMORAL  3+palpable  3+palpable   POPLITEAL  2+ palpable  3+palpable   POSTERIOR TIBIAL  1+palpable  1+palpable   DORSALIS PEDIS  ANTERIOR TIBIAL  2+palpable  2+palpable    Gastrointestinal: soft, NTND, -G/R, - HSM, - palpable masses, - CVAT B.  Musculoskeletal: M/S 5/5 throughout, Extremities without ischemic changes.  Neurologic: CN 2-12 intact, Pain and light touch intact in extremities, Motor exam as listed above.        Non-Invasive Vascular Imaging  AAA Duplex (09/19/2014) ABDOMINAL AORTA DUPLEX EVALUATION    INDICATION: Evaluation of abdominal aorta    PREVIOUS INTERVENTION(S): None    DUPLEX EXAM:     LOCATION DIAMETER AP (cm) DIAMETER TRANSVERSE (cm) VELOCITIES (cm/sec)  Aorta Proximal 2.8 2.5 60  Aorta Mid 2.8 2.9 58  Aorta Distal 4.2 3.9 55  Right Common Iliac Artery 1.4 1.3 65  Left Common Iliac Artery 1.1 1.3 73    Previous max aortic diameter:  4.1 x 4.2 Date: 03/21/2014     ADDITIONAL FINDINGS:     IMPRESSION: 1. 4.2 x 3.9 cm distal aortic aneurysm    Compared to the previous exam:  No significant change since exam of 03/21/2014     LOWER EXTREMITY ARTERIAL DUPLEX EVALUATION      INDICATION: Evaluate for popliteal artery aneurysm, prominate pulse right popliteal artery    PREVIOUS INTERVENTION(S): None    DUPLEX EXAM:     Popliteal Artery Diameter   AP (cm) Transverse (cm)  Right 1.0 .89  Left .90 .91     ADDITIONAL FINDINGS:     IMPRESSION: 1. No popliteal aneurysm visualized bilaterally    Medical Decision Making  The patient is a 80 y.o. male who presents with asymptomatic AAA with no increase in size. No popliteal aneurysm found on popliteal Duplex  today.   Based on this patient's exam and diagnostic studies, the patient will follow up in 6 months  with the following studies: AAA Duplex.  Consideration for repair of AAA would be made when the size is 5.5 cm, growth > 1 cm/yr, and symptomatic status.  I emphasized the importance of maximal medical management including strict control of blood pressure, blood glucose, and lipid levels, antiplatelet agents, obtaining regular exercise, and continued cessation of  smoking.   The patient was given information about AAA including signs, symptoms, treatment, and how to minimize the risk of enlargement and rupture of aneurysms.    The patient was advised to call 911 should the patient experience sudden onset abdominal or back pain.   Thank you for allowing [place] to participate in this patient's care.  Clemon Chambers, RN, MSN, FNP-C [facility] [place] Office: [phone]  Clinic Physician: Early  09/19/2014, 9:40 AM

## 2014-09-20 NOTE — Addendum Note (Signed)
Addended by: Dorthula Rue L on: 09/20/2014 05:03 PM   Modules accepted: Orders

## 2015-03-23 ENCOUNTER — Encounter: Payer: Self-pay | Admitting: Family

## 2015-03-26 ENCOUNTER — Encounter: Payer: Self-pay | Admitting: Family

## 2015-04-03 ENCOUNTER — Encounter: Payer: Self-pay | Admitting: Family

## 2015-04-03 ENCOUNTER — Ambulatory Visit (INDEPENDENT_AMBULATORY_CARE_PROVIDER_SITE_OTHER): Payer: Medicare Other | Admitting: Family

## 2015-04-03 ENCOUNTER — Ambulatory Visit (HOSPITAL_COMMUNITY)
Admission: RE | Admit: 2015-04-03 | Discharge: 2015-04-03 | Disposition: A | Discharge status: Home / Self Care | Payer: Medicare Other | Source: Ambulatory Visit | Attending: Family | Admitting: Family

## 2015-04-03 VITALS — BP 173/87 | HR 53 | Temp 97.2°F | Ht 71.0 in | Wt 157.0 lb

## 2015-04-03 DIAGNOSIS — IMO0001 Reserved for inherently not codable concepts without codable children: Secondary | ICD-10-CM

## 2015-04-03 DIAGNOSIS — I714 Abdominal aortic aneurysm, without rupture, unspecified: Secondary | ICD-10-CM

## 2015-04-03 DIAGNOSIS — H5702 Anisocoria: Secondary | ICD-10-CM

## 2015-04-03 DIAGNOSIS — R030 Elevated blood-pressure reading, without diagnosis of hypertension: Secondary | ICD-10-CM

## 2015-04-03 DIAGNOSIS — R0989 Other specified symptoms and signs involving the circulatory and respiratory systems: Secondary | ICD-10-CM | POA: Diagnosis not present

## 2015-04-03 DIAGNOSIS — Z8249 Family history of ischemic heart disease and other diseases of the circulatory system: Secondary | ICD-10-CM | POA: Diagnosis not present

## 2015-04-03 NOTE — Patient Instructions (Signed)
Abdominal Aortic Aneurysm An aneurysm is a weakened or damaged part of an artery wall that bulges from the normal force of blood pumping through the body. An abdominal aortic aneurysm is an aneurysm that occurs in the lower part of the aorta, the main artery of the body.  The major concern with an abdominal aortic aneurysm is that it can enlarge and burst (rupture) or blood can flow between the layers of the wall of the aorta through a tear (aorticdissection). Both of these conditions can cause bleeding inside the body and can be life threatening unless diagnosed and treated promptly. CAUSES  The exact cause of an abdominal aortic aneurysm is unknown. Some contributing factors are:   A hardening of the arteries caused by the buildup of fat and other substances in the lining of a blood vessel (arteriosclerosis).  Inflammation of the walls of an artery (arteritis).   Connective tissue diseases, such as Marfan syndrome.   Abdominal trauma.   An infection, such as syphilis or staphylococcus, in the wall of the aorta (infectious aortitis) caused by bacteria. RISK FACTORS  Risk factors that contribute to an abdominal aortic aneurysm may include:  Age older than 60 years.   High blood pressure (hypertension).  Male gender.  Ethnicity (white race).  Obesity.  Family history of aneurysm (first degree relatives only).  Tobacco use. PREVENTION  The following healthy lifestyle habits may help decrease your risk of abdominal aortic aneurysm:  Quitting smoking. Smoking can raise your blood pressure and cause arteriosclerosis.  Limiting or avoiding alcohol.  Keeping your blood pressure, blood sugar level, and cholesterol levels within normal limits.  Decreasing your salt intake. In somepeople, too much salt can raise blood pressure and increase your risk of abdominal aortic aneurysm.  Eating a diet low in saturated fats and cholesterol.  Increasing your fiber intake by including  whole grains, vegetables, and fruits in your diet. Eating these foods may help lower blood pressure.  Maintaining a healthy weight.  Staying physically active and exercising regularly. SYMPTOMS  The symptoms of abdominal aortic aneurysm may vary depending on the size and rate of growth of the aneurysm.Most grow slowly and do not have any symptoms. When symptoms do occur, they may include:  Pain (abdomen, side, lower back, or groin). The pain may vary in intensity. A sudden onset of severe pain may indicate that the aneurysm has ruptured.  Feeling full after eating only small amounts of food.  Nausea or vomiting or both.  Feeling a pulsating lump in the abdomen.  Feeling faint or passing out. DIAGNOSIS  Since most unruptured abdominal aortic aneurysms have no symptoms, they are often discovered during diagnostic exams for other conditions. An aneurysm may be found during the following procedures:  Ultrasonography (A one-time screening for abdominal aortic aneurysm by ultrasonography is also recommended for all men aged 65-75 years who have ever smoked).  X-ray exams.  A computed tomography (CT).  Magnetic resonance imaging (MRI).  Angiography or arteriography. TREATMENT  Treatment of an abdominal aortic aneurysm depends on the size of your aneurysm, your age, and risk factors for rupture. Medication to control blood pressure and pain may be used to manage aneurysms smaller than 6 cm. Regular monitoring for enlargement may be recommended by your caregiver if:  The aneurysm is 3-4 cm in size (an annual ultrasonography may be recommended).  The aneurysm is 4-4.5 cm in size (an ultrasonography every 6 months may be recommended).  The aneurysm is larger than 4.5 cm in   size (your caregiver may ask that you be examined by a vascular surgeon). If your aneurysm is larger than 6 cm, surgical repair may be recommended. There are two main methods for repair of an aneurysm:   Endovascular  repair (a minimally invasive surgery). This is done most often.  Open repair. This method is used if an endovascular repair is not possible.   This information is not intended to replace advice given to you by your health care provider. Make sure you discuss any questions you have with your health care provider.   Document Released: 11/27/2004 Document Revised: 06/14/2012 Document Reviewed: 03/19/2012 Elsevier Interactive Patient Education 2016 [employer]  

## 2015-04-03 NOTE — Progress Notes (Signed)
[facility] [place]  Established Abdominal Aortic Aneurysm  History of Present Illness  Charles Stephenson is a 81 y.o. ([birth year]) male patient of Dr. Donnetta Hutching who presents with chief complaint: follow up for AAA.  His AAA was incidentally found during routine physical exam by palpation.  Previous studies demonstrate an AAA measuring 4.0 cm on 03/23/13, 3.5 cm in January 2011 as earliest on file (review of records). The patient does not have back or abdominal pain, even though he has had back surgery.  The patient denies claudication in legs with walking, he walks 3 miles most days/week.  The patient denies history of stroke or TIA symptoms, denies any cardiac problems.   Father died of clotting disorder during surgery for AAA, as pt describes, sounds like possibly DIC.  Pt's brother also has a AAA, had an EVAR placed which became infected, 10 hour surgery to correct this; states his brother was obese, smoked, was in poor health.  He no longer takes a daily ASA as directed by his PCP, pt states he does not need it. Dr. Aundra Dubin has ordered cardiac stress testing as part of pt's routine exam, pt denies any personal history of CAD.  He has right radiculopathy, is scheduled to have ESI.  Pt Diabetic: No  Pt smoker: non-smoker  He denies any ETOH consumption    Past Medical History  Diagnosis Date  . AAA (abdominal aortic aneurysm)   . History of vertigo   . H/O hypercholesterolemia    Past Surgical History  Procedure Laterality Date  . Back surgery  1984    ruptured disc  . Hernia repair  2005  . Cardiovascular stress test  Jan. 14,2014  . Spine surgery    . Skin cancer excision  September 05, 2014    [facility]-  Nose   Social History Social History   Social History  . Marital Status: Married    Spouse Name: N/A  . Number of Children: N/A  . Years of Education: N/A   Occupational History  . Not on file.   Social History Main Topics  . Smoking status:  Never Smoker   . Smokeless tobacco: Never Used  . Alcohol Use: No  . Drug Use: No  . Sexual Activity: Not on file   Other Topics Concern  . Not on file   Social History Narrative   Family History Family History  Problem Relation Age of Onset  . AAA (abdominal aortic aneurysm) Father   . Peripheral vascular disease Father     Aneurysm- Aorta  . AAA (abdominal aortic aneurysm) Brother   . Heart disease Brother 10    Before age 10  . Cancer Brother     Prostate  . Heart attack Brother   . Cancer Mother     Stomach    Current Outpatient Prescriptions on File Prior to Visit  Medication Sig Dispense Refill  . aspirin EC 81 MG tablet Take 1 tablet (81 mg total) by mouth daily.    Marland Kitchen atorvastatin (LIPITOR) 20 MG tablet Take 1 tablet by mouth Daily.    . cefUROXime (CEFTIN) 250 MG tablet Take 250 mg by mouth 2 (two) times daily.  0  . gabapentin (NEURONTIN) 100 MG capsule Take 1 capsule (100 mg total) by mouth at bedtime. (Patient not taking: Reported on 03/21/2014) 30 capsule 0  . lactobacillus acidophilus & bulgar (LACTINEX) chewable tablet   0  . Multiple Vitamins-Minerals (CENTRUM SILVER PO) Take by mouth daily.    Marland Kitchen  omeprazole (PRILOSEC OTC) 20 MG tablet Take 20 mg by mouth daily.    . PredniSONE 10 MG KIT 12 day dose pack po (Patient not taking: Reported on 03/21/2014) 1 kit 0  . psyllium (METAMUCIL SMOOTH TEXTURE) 28 % packet Take 1 packet by mouth daily.    . Sulfacetamide Sodium-Sulfur 10-5 % EMUL   0   No current facility-administered medications on file prior to visit.   No Known Allergies  ROS: See HPI for pertinent positives and negatives.  Physical Examination  Filed Vitals:   04/03/15 0945 04/03/15 0946  BP: 169/90 173/87  Pulse: 18 53  Temp: 97.2 F (36.2 C)   TempSrc: Oral   Height: 5' 11"  (1.803 m)   Weight: 157 lb (71.215 kg)   SpO2: 99%    Body mass index is 21.91 kg/(m^2).   General: A&O x 3, WD, fit appearing.  Eyes: Left pupil is larger than  right. Pulmonary: Sym exp, good air movt, CTAB, no rales, rhonchi, or wheezing.  Cardiac: RRR, Nl S1, S2, no murmur detected.   Carotid Bruits  Left  Right    Negative  Negative    Aorta is is palpable  Radial pulses are 3+ palpable and =   VASCULAR EXAM:  LE Pulses  LEFT  RIGHT   FEMORAL  3+palpable  3+palpable   POPLITEAL  2+ palpable  3+palpable   POSTERIOR TIBIAL  1+palpable  1+palpable   DORSALIS PEDIS  ANTERIOR TIBIAL  2+palpable  2+palpable    Gastrointestinal: soft, NTND, -G/R, - HSM, - palpable masses, - CVAT B.  Musculoskeletal: M/S 5/5 throughout, Extremities without ischemic changes.  Neurologic: CN 2-12 intact, Pain and light touch intact in extremities, Motor exam as listed above.                Non-Invasive Vascular Imaging  AAA Duplex (04/03/2015)  Previous size: 4.2 cm (Date: 09/19/14)  Current size:  4.2 cm (Date: 04/03/2015)  Medical Decision Making  The patient is a 81 y.o. male who presents with asymptomatic AAA with no increase in size. His AAA had been gradually increasing in size over time except for the last couple of duplex, but will keep him on an every 6 months surveillance schedule.   Pt's left pupil is larger than his right; he does not know of any reason for this; he denies visual disturbances, denies headaches or dizziness.  I advised pt to notify his ophthalmologist and PCP about this, and to see his PCP about his elevated blood pressure.   Based on this patient's exam and diagnostic studies, the patient will follow up in 6 months  with the following studies: AAA duplex.  Consideration for repair of AAA would be made when the size is 5.5 cm, growth > 1 cm/yr, and symptomatic status.  I emphasized the importance of maximal medical management including strict control of blood pressure, blood glucose, and lipid levels, antiplatelet agents, obtaining regular  exercise, and continued cessation of smoking.   The patient was given information about AAA including signs, symptoms, treatment, and how to minimize the risk of enlargement and rupture of aneurysms.    The patient was advised to call 911 should the patient experience sudden onset abdominal or back pain.   Thank you for allowing [place] to participate in this patient's care.  Clemon Chambers, RN, MSN, FNP-C [facility] [place] Office: [facility] Physician: Early  04/03/2015, 8:51 AM

## 2015-04-04 NOTE — Addendum Note (Signed)
Addended by: Thresa Ross C on: 04/04/2015 10:08 AM   Modules accepted: Orders

## 2015-04-07 ENCOUNTER — Emergency Department (HOSPITAL_COMMUNITY)
Admission: EM | Admit: 2015-04-07 | Discharge: 2015-04-07 | Disposition: A | Discharge status: Home / Self Care | Payer: Medicare Other | Attending: Emergency Medicine | Admitting: Emergency Medicine

## 2015-04-07 ENCOUNTER — Emergency Department (HOSPITAL_COMMUNITY): Payer: Medicare Other

## 2015-04-07 ENCOUNTER — Encounter (HOSPITAL_COMMUNITY): Payer: Self-pay | Admitting: Emergency Medicine

## 2015-04-07 DIAGNOSIS — R0789 Other chest pain: Secondary | ICD-10-CM | POA: Insufficient documentation

## 2015-04-07 DIAGNOSIS — R079 Chest pain, unspecified: Secondary | ICD-10-CM | POA: Diagnosis present

## 2015-04-07 DIAGNOSIS — Z79899 Other long term (current) drug therapy: Secondary | ICD-10-CM | POA: Insufficient documentation

## 2015-04-07 LAB — CBC
HCT: 43.3 % (ref 39.0–52.0)
Hemoglobin: 14.9 g/dL (ref 13.0–17.0)
MCH: 31.6 pg (ref 26.0–34.0)
MCHC: 34.4 g/dL (ref 30.0–36.0)
MCV: 91.9 fL (ref 78.0–100.0)
Platelets: 154 10*3/uL (ref 150–400)
RBC: 4.71 MIL/uL (ref 4.22–5.81)
RDW: 13.6 % (ref 11.5–15.5)
WBC: 4.9 10*3/uL (ref 4.0–10.5)

## 2015-04-07 LAB — BASIC METABOLIC PANEL
Anion gap: 13 (ref 5–15)
BUN: 14 mg/dL (ref 6–20)
CO2: 27 mmol/L (ref 22–32)
Calcium: 9.4 mg/dL (ref 8.9–10.3)
Chloride: 102 mmol/L (ref 101–111)
Creatinine, Ser: 0.97 mg/dL (ref 0.61–1.24)
GFR calc Af Amer: >60 mL/min (ref >60)
GFR calc non Af Amer: >60 mL/min (ref >60)
Glucose, Bld: 143 mg/dL — ABNORMAL HIGH (ref 65–99)
Potassium: 4.4 mmol/L (ref 3.5–5.1)
Sodium: 142 mmol/L (ref 135–145)

## 2015-04-07 LAB — I-STAT TROPONIN, ED: Troponin i, poc: 0 ng/mL (ref 0.00–0.08)

## 2015-04-07 MED ORDER — FAMOTIDINE 20 MG PO TABS
20.0000 mg | ORAL_TABLET | Freq: Once | ORAL | Status: AC
  Administered 2015-04-07: 20 mg via ORAL
  Filled 2015-04-07: qty 1

## 2015-04-07 NOTE — ED Provider Notes (Signed)
CSN: [id number]     Arrival date & time 04/07/15  1347 History   First MD Initiated Contact with Patient 04/07/15 1548     Chief Complaint  Patient presents with  . Chest Pain     (Consider location/radiation/quality/duration/timing/severity/associated sxs/prior Treatment) HPI Comments: 81 year old male with history of lipids, reflux, AAA followed outpatient with ultrasounds presents with mid anterior chest burning with belching sensation. Patient's had this fairly constant since Wednesday. Patient was seen at urgent care and sent here for further evaluation. Patient's had no exertional symptoms, was worse after eating a large steak meal last night. Patient feels positional. No cardiac history. Patient has stress test partially 3 years ago that was unremarkable. No radiation. No abdominal pain or back pain. Patient follows closely for yearly physicals and is very healthy. Patient walks daily. No recent surgeries. Patient had something similar in the past for which it improved with antacids.  Patient is a 81 y.o. male presenting with chest pain. The history is provided by the patient.  Chest Pain Associated symptoms: no abdominal pain, no back pain, no cough, no diaphoresis, no fever, no headache, no shortness of breath and not vomiting     Past Medical History  Diagnosis Date  . AAA (abdominal aortic aneurysm) ([place])   . History of vertigo   . H/O hypercholesterolemia    Past Surgical History  Procedure Laterality Date  . Back surgery  1984    ruptured disc  . Hernia repair  2005  . Cardiovascular stress test  Jan. 14,2014  . Spine surgery    . Skin cancer excision  September 05, 2014    [facility]-  Nose   Family History  Problem Relation Age of Onset  . AAA (abdominal aortic aneurysm) Father   . Peripheral vascular disease Father     Aneurysm- Aorta  . AAA (abdominal aortic aneurysm) Brother   . Heart disease Brother 87    Before age 79  . Cancer Brother     Prostate  . Heart attack  Brother   . Cancer Mother     Stomach   Social History  Substance Use Topics  . Smoking status: Never Smoker   . Smokeless tobacco: Never Used  . Alcohol Use: No    Review of Systems  Constitutional: Negative for fever, chills and diaphoresis.  HENT: Negative for congestion.   Eyes: Negative for visual disturbance.  Respiratory: Negative for cough and shortness of breath.   Cardiovascular: Positive for chest pain. Negative for leg swelling.  Gastrointestinal: Negative for vomiting and abdominal pain.  Genitourinary: Negative for dysuria and flank pain.  Musculoskeletal: Negative for back pain, neck pain and neck stiffness.  Skin: Negative for rash.  Neurological: Negative for light-headedness and headaches.      Allergies  Review of patient's allergies indicates no known allergies.  Home Medications   Prior to Admission medications   Medication Sig Start Date End Date Taking? Authorizing Provider  atorvastatin (LIPITOR) 20 MG tablet Take 20 mg by mouth daily at 6 PM.  02/13/12  Yes Historical Provider, MD  bismuth subsalicylate (PEPTO BISMOL) 262 MG/15ML suspension Take 30 mLs by mouth daily as needed for indigestion.   Yes Historical Provider, MD  Cholecalciferol (VITAMIN D3) 2000 units capsule Take 2,000 Units by mouth daily.   Yes Historical Provider, MD  Multiple Vitamins-Minerals (CENTRUM SILVER PO) Take 1 tablet by mouth daily.    Yes Historical Provider, MD  psyllium (METAMUCIL SMOOTH TEXTURE) 28 % packet Take 1  packet by mouth daily.   Yes Historical Provider, MD  gabapentin (NEURONTIN) 100 MG capsule Take 1 capsule (100 mg total) by mouth at bedtime. Patient not taking: Reported on 03/21/2014 02/28/13   Gregor Hams, MD  omeprazole (PRILOSEC OTC) 20 MG tablet Take 20 mg by mouth daily. Reported on 04/03/2015    Historical Provider, MD  PredniSONE 10 MG KIT 12 day dose pack po Patient not taking: Reported on 03/21/2014 02/28/13   Gregor Hams, MD   BP 149/106 mmHg   Pulse 58  Temp(Src) 97.6 F (36.4 C) (Oral)  Resp 18  Ht _0  (1.778 m)  Wt 160 lb (72.576 kg)  BMI 22.96 kg/m2  SpO2 98% Physical Exam  Constitutional: He is oriented to person, place, and time. He appears well-developed and well-nourished.  HENT:  Head: Normocephalic and atraumatic.  Eyes: Conjunctivae are normal. Right eye exhibits no discharge. Left eye exhibits no discharge.  Neck: Normal range of motion. Neck supple. No tracheal deviation present.  Cardiovascular: Normal rate, regular rhythm and intact distal pulses.   No murmur heard. Pulmonary/Chest: Effort normal and breath sounds normal.  Abdominal: Soft. He exhibits no distension. There is no tenderness. There is no guarding.  Musculoskeletal: He exhibits no edema.  Neurological: He is alert and oriented to person, place, and time.  Skin: Skin is warm. No rash noted.  Psychiatric: He has a normal mood and affect.  Nursing note and vitals reviewed.   ED Course  Procedures (including critical care time) Labs Review Labs Reviewed  BASIC METABOLIC PANEL - Abnormal; Notable for the following:    Glucose, Bld 143 (*)    All other components within normal limits  CBC  I-STAT TROPOININ, ED    Imaging Review Dg Chest 2 View  04/07/2015  CLINICAL DATA:  Acute chest pain. EXAM: CHEST  2 VIEW COMPARISON:  Jul 21, 2014. FINDINGS: The heart size and mediastinal contours are within normal limits. Both lungs are clear. No pneumothorax or pleural effusion is noted. Hyperexpansion of the lungs is noted consistent with chronic obstructive pulmonary disease. Multilevel degenerative disc disease is noted in the thoracic spine. IMPRESSION: Findings consistent with chronic obstructive pulmonary disease. No acute cardiopulmonary abnormality seen. Electronically Signed   By: Marijo Conception, M.D.   On: 04/07/2015 14:49   I have personally reviewed and evaluated these images and lab results as part of my medical decision-making.   EKG  Interpretation   Date/Time:  Saturday April 07 2015 13:52:38 EST Ventricular Rate:  63 PR Interval:  156 QRS Duration: 90 QT Interval:  408 QTC Calculation: 417 R Axis:   82 Text Interpretation:  Sinus rhythm with Premature atrial complexes Overall  similar to previous, subtle ST depression lateral Confirmed by Kellie Murrill  MD,  Deletha Jaffee ([id number]) on 04/07/2015 4:00:27 PM Also confirmed by Reather Converse  MD, Alainah Phang  ([id number])  on 04/07/2015 4:01:01 PM      MDM   Final diagnoses:  Chest pain, atypical   Very well-appearing patient presents with atypical chest burning sensation. Clinically concern for heartburn. With age and high cholesterol screening cardiac workup done. Troponin negative. Patient's had constant symptoms for hours and negative troponin. No sign of significant cardiac event. EKG overall similar previous reviewed. Discussed for completeness outpatient stress test with subtle lateral ST depression on one of the EKGs. Patient has mild symptoms currently. Plan for Prilosec and follow-up with primary Dr. and stress test this week. Paged cardiology to assist in ordering stress test.  Repaged cardiology, discussed with Aldona Bar taking calls- she sent a message for the office to call patient Monday morning to be evaluated for possible stress test if symptoms persist.  Results and differential diagnosis were discussed with the patient/parent/guardian. Xrays were independently reviewed by myself.  Close follow up outpatient was discussed, comfortable with the plan.   Medications  famotidine (PEPCID) tablet 20 mg (20 mg Oral Given 04/07/15 1612)    Filed Vitals:   04/07/15 1351 04/07/15 1546  BP: 146/88 149/106  Pulse: 62 58  Temp: 97.6 F (36.4 C)   TempSrc: Oral   Resp: 18 18  Height: _0  (1.778 m)   Weight: 160 lb (72.576 kg)   SpO2: 99% 98%    Final diagnoses:  Chest pain, atypical       Elnora Morrison, MD 04/07/15 1642

## 2015-04-07 NOTE — Discharge Instructions (Signed)
Purchase prilosec and take for 2 weeks. If you were given medicines take as directed.  If you are on coumadin or contraceptives realize their levels and effectiveness is altered by many different medicines.  If you have any reaction (rash, tongues swelling, other) to the medicines stop taking and see a physician.    If your blood pressure was elevated in the ER make sure you follow up for management with a primary doctor or return for chest pain, shortness of breath or stroke symptoms.  Please follow up as directed and return to the ER or see a physician for new or worsening symptoms.  Thank you. Filed Vitals:   04/07/15 1351 04/07/15 1546  BP: 146/88 149/106  Pulse: 62 58  Temp: 97.6 F (36.4 C)   TempSrc: Oral   Resp: 18 18  Height: 5\' 10"  (1.778 m)   Weight: 160 lb (72.576 kg)   SpO2: 99% 98%

## 2015-04-07 NOTE — ED Notes (Signed)
Pt c/o chest pain onset Wednesday. Pt has tried over the counter medication for heart burn. Pt seen at [facility] and sent here for further eval.

## 2015-04-09 ENCOUNTER — Telehealth: Payer: Self-pay | Admitting: Cardiovascular Disease

## 2015-04-09 NOTE — Telephone Encounter (Signed)
New Message:  Pt will be coming in to see Dr. Fletcher Anon tomorrow at 10:15am . He is a former Dr. Aundra Dubin pt and was last seen on 03/12/2012.

## 2015-04-10 ENCOUNTER — Encounter: Payer: Self-pay | Admitting: Cardiovascular Disease

## 2015-04-10 ENCOUNTER — Telehealth (HOSPITAL_COMMUNITY): Payer: Self-pay

## 2015-04-10 ENCOUNTER — Ambulatory Visit (INDEPENDENT_AMBULATORY_CARE_PROVIDER_SITE_OTHER): Payer: Medicare Other | Admitting: Cardiovascular Disease

## 2015-04-10 VITALS — BP 138/78 | HR 66 | Ht 71.0 in | Wt 161.1 lb

## 2015-04-10 DIAGNOSIS — R0789 Other chest pain: Secondary | ICD-10-CM | POA: Diagnosis not present

## 2015-04-10 DIAGNOSIS — E785 Hyperlipidemia, unspecified: Secondary | ICD-10-CM

## 2015-04-10 DIAGNOSIS — I714 Abdominal aortic aneurysm, without rupture, unspecified: Secondary | ICD-10-CM

## 2015-04-10 NOTE — Assessment & Plan Note (Signed)
He is currently being treated with atorvastatin. Recommend a target LDL of the stent 100 and preferably less than 70.

## 2015-04-10 NOTE — Assessment & Plan Note (Signed)
The chest pain is overall atypical with some features suggestive of a musculoskeletal etiology. He is also reporting heartburn which might be related to GERD. I agree with treatment with the PPI. He does have risk factors for coronary artery disease with an abnormal EKG suggestive of prior anterior MI. He is known to have an abdominal aortic aneurysm and hyperlipidemia. Due to that, I recommend evaluation with a treadmill nuclear stress test.

## 2015-04-10 NOTE — Patient Instructions (Signed)
Medication Instructions:  Your physician recommends that you continue on your current medications as directed. Please refer to the Current Medication list given to you today.  Labwork: No new orders.   Testing/Procedures: Your physician has requested that you have an exercise stress myoview. For further information please visit [email or web address]. Please follow instruction sheet, as given.  Follow-Up: Your physician recommends that you schedule a follow-up appointment as needed with Dr Fletcher Anon.    Any Other Special Instructions Will Be Listed Below (If Applicable).     If you need a refill on your cardiac medications before your next appointment, please call your pharmacy.

## 2015-04-10 NOTE — Telephone Encounter (Signed)
Patient given detailed instructions per Myocardial Perfusion Study Information Sheet for the test on 04/16/2015 at 12:30. Patient notified to arrive 15 minutes early and that it is imperative to arrive on time for appointment to keep from having the test rescheduled.  If you need to cancel or reschedule your appointment, please call the office within 24 hours of your appointment. Failure to do so may result in a cancellation of your appointment, and a $50 no show fee. Patient verbalized understanding.EHK

## 2015-04-10 NOTE — Assessment & Plan Note (Signed)
Continue regular follow-up with [facility].

## 2015-04-10 NOTE — Progress Notes (Signed)
HPI  This is a pleasant 81 year old man who was referred from the emergency room at [facility] for evaluation of chest pain. The patient has no previous cardiac history. He has known history of abdominal aortic aneurysm which has been stable and followed by [facility]. He also has hyperlipidemia but otherwise he has been healthy with no other chronic medical conditions. He is not a smoker. His brother had myocardial infarction twice but he had a poor lifestyle overall. The patient is very active physically and plays golf on a regular basis with no exertional symptoms. On the days that he does not play golf, he walks for 2-3 months with no significant exertional symptoms. He was evaluated by Dr. Aundra Dubin in 2014 for atypical chest pain and abnormal EKG which showed poor R-wave progression in the precordial leads. He had a treadmill nuclear stress test which showed no evidence of ischemia with normal ejection fraction.   the patient played golf on Wednesday and did some weight lifting. He hasn't done weightlifting and about 2 months before that. Thursday afternoon he started having substernal chest pain described as aching sensation with heartburn. There was no shortness of breath or dizziness. He went to urgent care for evaluation and given his slightly abnormal EKG, he was sent to the emergency room where his troponin was found to be negative. The patient was discharged home on Prilosec with instructions to follow-up with cardiology. He reports that the chest pain has not resolved completely. It does not get worse worse with physical activities. It does get worse with certain movements.  No Known Allergies   Current Outpatient Prescriptions on File Prior to Visit  Medication Sig Dispense Refill  . atorvastatin (LIPITOR) 20 MG tablet Take 20 mg by mouth daily at 6 PM.     . bismuth subsalicylate (PEPTO BISMOL) 262 MG/15ML suspension Take 30 mLs by mouth daily as needed for indigestion.    . Cholecalciferol (VITAMIN  D3) 2000 units capsule Take 2,000 Units by mouth daily.    . Multiple Vitamins-Minerals (CENTRUM SILVER PO) Take 1 tablet by mouth daily.     Marland Kitchen omeprazole (PRILOSEC OTC) 20 MG tablet Take 20 mg by mouth daily. Reported on 04/03/2015    . psyllium (METAMUCIL SMOOTH TEXTURE) 28 % packet Take 1 packet by mouth daily.     No current facility-administered medications on file prior to visit.     Past Medical History  Diagnosis Date  . AAA (abdominal aortic aneurysm) ([place])   . History of vertigo   . H/O hypercholesterolemia      Past Surgical History  Procedure Laterality Date  . Back surgery  1984    ruptured disc  . Hernia repair  2005  . Cardiovascular stress test  Jan. 14,2014  . Spine surgery    . Skin cancer excision  September 05, 2014    [facility]  Nose     Family History  Problem Relation Age of Onset  . AAA (abdominal aortic aneurysm) Father   . Peripheral vascular disease Father     Aneurysm- Aorta  . AAA (abdominal aortic aneurysm) Brother   . Heart disease Brother 11    Before age 21  . Cancer Brother     Prostate  . Heart attack Brother   . Cancer Mother     Stomach     Social History   Social History  . Marital Status: Married    Spouse Name: N/A  . Number of Children: N/A  . Years  of Education: N/A   Occupational History  . Not on file.   Social History Main Topics  . Smoking status: Never Smoker   . Smokeless tobacco: Never Used  . Alcohol Use: No  . Drug Use: No  . Sexual Activity: Not on file   Other Topics Concern  . Not on file   Social History Narrative     ROS A 10 point review of system was performed. It is negative other than that mentioned in the history of present illness.   PHYSICAL EXAM   BP 138/78 mmHg  Pulse 66  Ht 5\' 11"  (1.803 m)  Wt 161 lb 1.9 oz (73.084 kg)  BMI 22.48 kg/m2 Constitutional: He is oriented to person, place, and time. He appears well-developed and well-nourished. No distress.  HENT: No nasal discharge.    Head: Normocephalic and atraumatic.  Eyes: Pupils are equal and round.  No discharge. Neck: Normal range of motion. Neck supple. No JVD present. No thyromegaly present.  Cardiovascular: Normal rate, regular rhythm, normal heart sounds. Exam reveals no gallop and no friction rub. No murmur heard.  Pulmonary/Chest: Effort normal and breath sounds normal. No stridor. No respiratory distress. He has no wheezes. He has no rales. He exhibits no tenderness.  Abdominal: Soft. Bowel sounds are normal. He exhibits no distension. There is no tenderness. There is no rebound and no guarding.  Musculoskeletal: Normal range of motion. He exhibits no edema and no tenderness.  Neurological: He is alert and oriented to person, place, and time. Coordination normal.  Skin: Skin is warm and dry. No rash noted. He is not diaphoretic. No erythema. No pallor.  Psychiatric: He has a normal mood and affect. His behavior is normal. Judgment and thought content normal.       I reviewed his prior EKGs which show normal sinus rhythm with poor R-wave progression in the anterior leads suggestive of possible old anterior MI.  ASSESSMENT AND PLAN

## 2015-04-16 ENCOUNTER — Ambulatory Visit (HOSPITAL_COMMUNITY): Payer: Medicare Other | Attending: Internal Medicine

## 2015-04-16 DIAGNOSIS — R9439 Abnormal result of other cardiovascular function study: Secondary | ICD-10-CM | POA: Insufficient documentation

## 2015-04-16 DIAGNOSIS — R0789 Other chest pain: Secondary | ICD-10-CM | POA: Insufficient documentation

## 2015-04-16 LAB — MYOCARDIAL PERFUSION IMAGING
Estimated workload: 7 METS
Exercise duration (min): 6 min
Exercise duration (sec): 0 s
LV dias vol: 122 mL
LV sys vol: 60 mL
MPHR: 142 {beats}/min
Peak HR: 130 {beats}/min
Percent HR: 91 %
RATE: 0.26
Rest HR: 46 {beats}/min
SDS: 2
SRS: 2
SSS: 4
TID: 0.96

## 2015-04-16 MED ORDER — TECHNETIUM TC 99M SESTAMIBI GENERIC - CARDIOLITE
32.8000 | Freq: Once | INTRAVENOUS | Status: AC | PRN
  Administered 2015-04-16: 32.8 via INTRAVENOUS

## 2015-04-16 MED ORDER — TECHNETIUM TC 99M SESTAMIBI GENERIC - CARDIOLITE
10.2000 | Freq: Once | INTRAVENOUS | Status: AC | PRN
  Administered 2015-04-16: 10 via INTRAVENOUS

## 2015-04-18 ENCOUNTER — Encounter (HOSPITAL_COMMUNITY): Payer: Self-pay

## 2015-05-10 LAB — BASIC METABOLIC PANEL
BUN: 16 mg/dL (ref 4–21)
Creatinine: 0.9 mg/dL (ref <=1.3)
Glucose: 91 mg/dL
Potassium: 4.7 mmol/L (ref 3.4–5.3)
Sodium: 142 mmol/L (ref 137–147)

## 2015-05-10 LAB — HEPATIC FUNCTION PANEL
ALT: 22 U/L (ref 10–40)
AST: 25 U/L (ref 14–40)
Alkaline Phosphatase: 65 U/L (ref 25–125)
Bilirubin, Direct: 0.22 mg/dL
Bilirubin, Total: 0.7 mg/dL

## 2015-05-10 LAB — CBC AND DIFFERENTIAL
HCT: 42 % (ref 41–53)
Hemoglobin: 14 g/dL (ref 13.5–17.5)
Neutrophils Absolute: 55 /uL
Platelets: 166 10*3/uL (ref 150–399)
WBC: 4.5 10^3/mL

## 2015-05-10 LAB — PSA: PSA: 1

## 2015-10-11 ENCOUNTER — Other Ambulatory Visit (HOSPITAL_COMMUNITY): Payer: Self-pay

## 2015-10-11 ENCOUNTER — Ambulatory Visit: Payer: Self-pay | Admitting: Family

## 2015-10-23 ENCOUNTER — Ambulatory Visit: Payer: Self-pay | Admitting: Family

## 2015-10-23 ENCOUNTER — Other Ambulatory Visit (HOSPITAL_COMMUNITY): Payer: Self-pay

## 2015-11-23 ENCOUNTER — Encounter: Payer: Self-pay | Admitting: Family

## 2015-11-29 ENCOUNTER — Ambulatory Visit (HOSPITAL_COMMUNITY)
Admission: RE | Admit: 2015-11-29 | Discharge: 2015-11-29 | Disposition: A | Discharge status: Home / Self Care | Payer: Medicare Other | Source: Ambulatory Visit | Attending: Family | Admitting: Family

## 2015-11-29 ENCOUNTER — Ambulatory Visit (INDEPENDENT_AMBULATORY_CARE_PROVIDER_SITE_OTHER): Payer: Medicare Other | Admitting: Family

## 2015-11-29 ENCOUNTER — Encounter: Payer: Self-pay | Admitting: Family

## 2015-11-29 VITALS — BP 148/89 | HR 51 | Temp 97.3°F | Resp 16 | Ht 71.0 in | Wt 157.5 lb

## 2015-11-29 DIAGNOSIS — Z8249 Family history of ischemic heart disease and other diseases of the circulatory system: Secondary | ICD-10-CM

## 2015-11-29 DIAGNOSIS — I714 Abdominal aortic aneurysm, without rupture, unspecified: Secondary | ICD-10-CM

## 2015-11-29 DIAGNOSIS — R0989 Other specified symptoms and signs involving the circulatory and respiratory systems: Secondary | ICD-10-CM

## 2015-11-29 NOTE — Patient Instructions (Addendum)
Abdominal Aortic Aneurysm An aneurysm is a weakened or damaged part of an artery wall that bulges from the normal force of blood pumping through the body. An abdominal aortic aneurysm is an aneurysm that occurs in the lower part of the aorta, the main artery of the body.  The major concern with an abdominal aortic aneurysm is that it can enlarge and burst (rupture) or blood can flow between the layers of the wall of the aorta through a tear (aorticdissection). Both of these conditions can cause bleeding inside the body and can be life threatening unless diagnosed and treated promptly. CAUSES  The exact cause of an abdominal aortic aneurysm is unknown. Some contributing factors are:   A hardening of the arteries caused by the buildup of fat and other substances in the lining of a blood vessel (arteriosclerosis).  Inflammation of the walls of an artery (arteritis).   Connective tissue diseases, such as Marfan syndrome.   Abdominal trauma.   An infection, such as syphilis or staphylococcus, in the wall of the aorta (infectious aortitis) caused by bacteria. RISK FACTORS  Risk factors that contribute to an abdominal aortic aneurysm may include:  Age older than 60 years.   High blood pressure (hypertension).  Male gender.  Ethnicity (white race).  Obesity.  Family history of aneurysm (first degree relatives only).  Tobacco use. PREVENTION  The following healthy lifestyle habits may help decrease your risk of abdominal aortic aneurysm:  Quitting smoking. Smoking can raise your blood pressure and cause arteriosclerosis.  Limiting or avoiding alcohol.  Keeping your blood pressure, blood sugar level, and cholesterol levels within normal limits.  Decreasing your salt intake. In somepeople, too much salt can raise blood pressure and increase your risk of abdominal aortic aneurysm.  Eating a diet low in saturated fats and cholesterol.  Increasing your fiber intake by including  whole grains, vegetables, and fruits in your diet. Eating these foods may help lower blood pressure.  Maintaining a healthy weight.  Staying physically active and exercising regularly. SYMPTOMS  The symptoms of abdominal aortic aneurysm may vary depending on the size and rate of growth of the aneurysm.Most grow slowly and do not have any symptoms. When symptoms do occur, they may include:  Pain (abdomen, side, lower back, or groin). The pain may vary in intensity. A sudden onset of severe pain may indicate that the aneurysm has ruptured.  Feeling full after eating only small amounts of food.  Nausea or vomiting or both.  Feeling a pulsating lump in the abdomen.  Feeling faint or passing out. DIAGNOSIS  Since most unruptured abdominal aortic aneurysms have no symptoms, they are often discovered during diagnostic exams for other conditions. An aneurysm may be found during the following procedures:  Ultrasonography (A one-time screening for abdominal aortic aneurysm by ultrasonography is also recommended for all men aged 65-75 years who have ever smoked).  X-ray exams.  A computed tomography (CT).  Magnetic resonance imaging (MRI).  Angiography or arteriography. TREATMENT  Treatment of an abdominal aortic aneurysm depends on the size of your aneurysm, your age, and risk factors for rupture. Medication to control blood pressure and pain may be used to manage aneurysms smaller than 6 cm. Regular monitoring for enlargement may be recommended by your caregiver if:  The aneurysm is 3-4 cm in size (an annual ultrasonography may be recommended).  The aneurysm is 4-4.5 cm in size (an ultrasonography every 6 months may be recommended).  The aneurysm is larger than 4.5 cm in   size (your caregiver may ask that you be examined by a vascular surgeon). If your aneurysm is larger than 6 cm, surgical repair may be recommended. There are two main methods for repair of an aneurysm:   Endovascular  repair (a minimally invasive surgery). This is done most often.  Open repair. This method is used if an endovascular repair is not possible.   This information is not intended to replace advice given to you by your health care provider. Make sure you discuss any questions you have with your health care provider.   Document Released: 11/27/2004 Document Revised: 06/14/2012 Document Reviewed: 03/19/2012 Elsevier Interactive Patient Education 2016 [employer]    Before your next abdominal ultrasound:  Take two Extra-Strength Gas-X capsules at bedtime the night before the test. Take another two Extra-Strength Gas-X capsules 3 hours before the test.   

## 2015-11-29 NOTE — Progress Notes (Signed)
[facility] [place]   CC: Follow up Abdominal Aortic Aneurysm  History of Present Illness  Charles Stephenson is a 81 y.o. (Oct 09, 1934) male patient of Dr. Donnetta Hutching who presents with chief complaint: follow up for AAA.  His AAA was incidentally found during routine physical exam by palpation.  Previous studies demonstrate an AAA measuring 4.0 cm on 03/23/13, 3.5 cm in January 2011 as earliest on file (review of records).  He has prominent popliteal pulses; July 2016 popliteal artery duplex demonstrated no evidence of popliteal artery aneurysms.   The patient does not have back or abdominal pain, even though he has had back surgery.  The patient denies claudication in legs with walking, he walks 3 miles most days/week.  The patient denies history of stroke or TIA symptoms, denies any cardiac problems.   Father died of clotting disorder during surgery for AAA, as pt describes, sounds like possibly DIC.  Pt's brother also has a AAA, had an EVAR placed which became infected, 10 hour surgery to correct this; states his brother was obese, smoked, was in poor health.  He no longer takes a daily ASA as directed by his PCP, pt states he does not need it.  Pt had cardiac stress testing as part of pt's routine exam in March 2017, states it was normal, pt denies any personal history of CAD.  He denies light-headedness, denies chest pain, denies dyspnea.   Pt states his ophthalmologist told him that his left pupil is larger than right due to cataract surgery.   Pt states his blood pressure at home is 120's/70's, states he checks it regularly.   He has right radiculopathy, is scheduled to have ESI.  Pt Diabetic: No  Pt smoker: non-smoker  He denies any ETOH consumption    Past Medical History:  Diagnosis Date  . AAA (abdominal aortic aneurysm) ([place])   . H/O hypercholesterolemia   . History of vertigo    Past Surgical History:  Procedure Laterality Date  . BACK  SURGERY  1984   ruptured disc  . CARDIOVASCULAR STRESS TEST  Jan. 14,2014  . HERNIA REPAIR  2005  . SKIN CANCER EXCISION  September 05, 2014   [facility]-  Nose  . SPINE SURGERY     Social History Social History   Social History  . Marital status: Married    Spouse name: N/A  . Number of children: N/A  . Years of education: N/A   Occupational History  . Not on file.   Social History Main Topics  . Smoking status: Never Smoker  . Smokeless tobacco: Never Used  . Alcohol use No  . Drug use: No  . Sexual activity: Not on file   Other Topics Concern  . Not on file   Social History Narrative  . No narrative on file   Family History Family History  Problem Relation Age of Onset  . AAA (abdominal aortic aneurysm) Father   . Peripheral vascular disease Father     Aneurysm- Aorta  . AAA (abdominal aortic aneurysm) Brother   . Heart disease Brother 64    Before age 52  . Cancer Brother     Prostate  . Heart attack Brother   . Cancer Mother     Stomach    Current Outpatient Prescriptions on File Prior to Visit  Medication Sig Dispense Refill  . atorvastatin (LIPITOR) 20 MG tablet Take 20 mg by mouth daily at 6 PM.     . Cholecalciferol (  VITAMIN D3) 2000 units capsule Take 2,000 Units by mouth daily.    . Multiple Vitamins-Minerals (CENTRUM SILVER PO) Take 1 tablet by mouth daily.     . psyllium (METAMUCIL SMOOTH TEXTURE) 28 % packet Take 1 packet by mouth daily.     No current facility-administered medications on file prior to visit.    No Known Allergies  ROS: See HPI for pertinent positives and negatives.  Physical Examination  Vitals:   11/29/15 0909  BP: (!) 148/89  Pulse: (!) 51  Resp: 16  Temp: 97.3 F (36.3 C)  TempSrc: Oral  SpO2: 98%  Weight: 157 lb 8 oz (71.4 kg)  Height: 5\' 11"  (1.803 m)   Body mass index is 21.97 kg/m.  General: A&O x 3, WD, fit appearing.  Eyes: Left pupil is larger than right. Pulmonary: Sym exp, good air movt, CTAB, no rales,  rhonchi, or wheezing.  Cardiac: Regular rhythm, bradycardic rate, no murmur detected.   Carotid Bruits  Left  Right    positive Negative    Aorta is is palpable  Radial pulses are 3+ palpable and =   VASCULAR EXAM:  LE Pulses  LEFT  RIGHT   FEMORAL  3+palpable  3+palpable   POPLITEAL  2+ palpable  3+palpable   POSTERIOR TIBIAL  1+palpable  1+palpable   DORSALIS PEDIS  ANTERIOR TIBIAL  1+palpable  1+palpable    Gastrointestinal: soft, NTND, -G/R, - HSM, - palpable masses, - CVAT B.  Musculoskeletal: M/S 5/5 throughout, Extremities without ischemic changes.  Neurologic: CN 2-12 intact, Pain and light touch intact in extremities, Motor exam as listed above.    Medical Decision Making  The patient is a 81 y.o. male who presents with asymptomatic AAA with stable size at 4.2 cm at the distal abdominal aorta, based on limited visualization due to overlying bowel gas; iliac arteries not visualized. No significant stenosis visualized.  His AAA had been gradually increasing in size over time except for the last few of duplex, but will keep him on an every 6 months surveillance schedule.  Left carotid bruit present on today's exam, not present on previous exams; pt denies hx of TIA or stroke; will check carotid duplex on his return in 6 months. Asymptomatic bradycardia, he is not on a beta blocker.    Based on this patient's exam and diagnostic studies, the patient will follow up in 6 months with the following studies: AAA duplex.  Consideration for repair of AAA would be made when the size is 5.0 cm, growth > 1 cm/yr, and symptomatic status.  I emphasized the importance of maximal medical management including strict control of blood pressure, blood glucose, and lipid levels, antiplatelet agents, obtaining regular exercise, and continued  cessation of smoking.   The patient was given information about AAA including  signs, symptoms, treatment, and how to minimize the risk of enlargement and rupture of aneurysms.    The patient was advised to call 911 should the patient experience sudden onset abdominal or back pain.   Thank you for allowing [place] to participate in this patient's care.  Clemon Chambers, RN, MSN, FNP-C [facility] [place] Office: [phone]  Clinic Physician: Bridgett Larsson  11/29/2015, 9:15 AM

## 2015-12-10 ENCOUNTER — Ambulatory Visit (INDEPENDENT_AMBULATORY_CARE_PROVIDER_SITE_OTHER): Payer: Medicare Other | Admitting: Family Medicine

## 2015-12-10 ENCOUNTER — Encounter: Payer: Self-pay | Admitting: Family Medicine

## 2015-12-10 VITALS — BP 138/82 | HR 51 | Temp 98.3°F | Ht 69.0 in | Wt 158.6 lb

## 2015-12-10 DIAGNOSIS — J302 Other seasonal allergic rhinitis: Secondary | ICD-10-CM | POA: Insufficient documentation

## 2015-12-10 DIAGNOSIS — I714 Abdominal aortic aneurysm, without rupture, unspecified: Secondary | ICD-10-CM

## 2015-12-10 DIAGNOSIS — Z23 Encounter for immunization: Secondary | ICD-10-CM | POA: Diagnosis not present

## 2015-12-10 DIAGNOSIS — E785 Hyperlipidemia, unspecified: Secondary | ICD-10-CM

## 2015-12-10 DIAGNOSIS — Z85828 Personal history of other malignant neoplasm of skin: Secondary | ICD-10-CM | POA: Insufficient documentation

## 2015-12-10 MED ORDER — ATORVASTATIN CALCIUM 20 MG PO TABS: 20.0000 mg | ORAL_TABLET | Freq: Every day | ORAL | 3 refills | Status: DC

## 2015-12-10 NOTE — Assessment & Plan Note (Signed)
S: follows with vascular surgery. Stable aroudn 4.2 over last 18 months after previously being 3.5.  A/P: we discussed today consideration of trying to lower BP if aneurysm enlarges further. He checks at home and in 120s or lower most of the time and also same at pharmacy. May have white coat element.

## 2015-12-10 NOTE — Progress Notes (Signed)
Pre visit review using our clinic review tool, if applicable. No additional management support is needed unless otherwise documented below in the visit note. 

## 2015-12-10 NOTE — Progress Notes (Signed)
Phone: [phone]  Subjective:  Patient presents today to establish care. Prior Doctor was Dr. 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   See problem oriented charting  The following were reviewed and entered/updated in epic: Past Medical History:  Diagnosis Date  . AAA (abdominal aortic aneurysm) ([place])   . Diverticulitis    3x through 18 months around 2012  . H/O hypercholesterolemia   . History of alcohol abuse    sober since age 69  . History of vertigo    december 2007   . Seasonal allergies    no rx   Patient Active Problem List   Diagnosis Date Noted  . Hyperlipidemia 03/12/2012    Priority: Medium  . AAA (abdominal aortic aneurysm) ([place]) 03/12/2012    Priority: Medium  . Chest pain 03/12/2012    Priority: Low  . History of basal cell carcinoma of skin 12/10/2015  . Seasonal allergies    Past Surgical History:  Procedure Laterality Date  . BACK SURGERY  1984   ruptured disc  . bilateral cataract     L pupil larger after lens implant  . CARDIOVASCULAR STRESS TEST  Jan. 14,2014  . HERNIA REPAIR  2005  . SKIN CANCER EXCISION  09/05/2014   [facility]-  Nose. 9 total     Family History  Problem Relation Age of Onset  . AAA (abdominal aortic aneurysm) Father     sounds like died of DIC- otherwise healthy  . Peripheral vascular disease Father     Aneurysm- Aorta  . AAA (abdominal aortic aneurysm) Brother     states multiple medicle issues but died of pulmonary fibrosis- was told smoking related  . Heart disease Brother 69    Before age 11  . Heart attack Brother     x2  . Alcohol abuse Brother     sober 25 years when he died  . Prostate cancer Brother   . Cancer Mother     Stomach    Medications- reviewed and updated Current Outpatient Prescriptions  Medication Sig Dispense Refill  . atorvastatin (LIPITOR) 20 MG tablet Take 1 tablet (20 mg total) by mouth daily at 6 PM. 90 tablet 3  .  Cholecalciferol (VITAMIN D3) 2000 units capsule Take 2,000 Units by mouth daily.    . Multiple Vitamins-Minerals (CENTRUM SILVER PO) Take 1 tablet by mouth daily.     . psyllium (METAMUCIL SMOOTH TEXTURE) 28 % packet Take 1 packet by mouth daily.     No current facility-administered medications for this visit.     Allergies-reviewed and updated No Known Allergies  Social History   Social History  . Marital status: Married    Spouse name: N/A  . Number of children: N/A  . Years of education: N/A   Social History Main Topics  . Smoking status: Never Smoker  . Smokeless tobacco: Never Used  . Alcohol use No  . Drug use: No  . Sexual activity: Not on file   Other Topics Concern  . Not on file   Social History Narrative   Married to wife Markail Michels (patient of mine). 2 kids. No grandkids. No pets.       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  HENT: Negative for hearing loss.   Eyes: Negative for blurred vision and double vision.  Respiratory: Negative for cough and hemoptysis.   Cardiovascular: Negative for chest pain and palpitations.  Gastrointestinal: Negative for heartburn and nausea.  Genitourinary: Negative for dysuria and urgency.  Musculoskeletal: Negative for myalgias and neck pain.  Skin: Negative for itching and rash.  Neurological: Negative for dizziness, tingling and headaches.  Endo/Heme/Allergies: Negative for environmental allergies. Does not bruise/bleed easily.  Psychiatric/Behavioral: Negative for depression and suicidal ideas.   Objective: BP 138/82 (BP Location: Left Arm, Patient Position: Sitting, Cuff Size: Normal)   Pulse (!) 51   Temp 98.3 F (36.8 C) (Oral)   Ht 5\' 9"  (1.753 m)   Wt 158 lb 9.6 oz (71.9 kg)    SpO2 96%   BMI 23.42 kg/m  Gen: NAD, resting comfortably HEENT: Mucous membranes are moist. Oropharynx normal. TM normal. Eyes: sclera and lids normal, Left pupil larger than right- minimally reactive to light CV: RRR no murmurs rubs or gallops Lungs: CTAB no crackles, wheeze, rhonchi Abdomen: soft/nontender/nondistended/normal bowel sounds. No rebound or guarding.  Ext: no edema Skin: warm, dry Neuro: 5/5 strength in upper and lower extremities, normal gait, normal reflexes  Assessment/Plan:  Hyperlipidemia S: well controlled on atorvastatin 20mg  with LDL in 2017 of 69 (will scan in records). No myalgias.   A/P: refilled medication. Follow up in march with labs (last 05/10/15)   AAA (abdominal aortic aneurysm) ([place]) S: follows with vascular surgery. Stable aroudn 4.2 over last 18 months after previously being 3.5.  A/P: we discussed today consideration of trying to lower BP if aneurysm enlarges further. He checks at home and in 120s or lower most of the time and also same at pharmacy. May have white coat element.    05/2015 last CPE- requests 2018 here, will come fasting. Likes to get urine with his CPE.   Get a copy of all immunizations  COlonoscopy 2012. 2017 November next visit locally- asked him to have physician send [place] records  PSA 1.0 in 05/2015, 1.2 year prior. Will discuss at CPE potentially not screening further  05/09/16 or later for CPE  Orders Placed This Encounter  Procedures  . Flu vaccine HIGH DOSE PF    Meds ordered this encounter  Medications  . atorvastatin (LIPITOR) 20 MG tablet    Sig: Take 1 tablet (20 mg total) by mouth daily at 6 PM.    Dispense:  90 tablet    Refill:  3    Return precautions advised.  Garret Reddish, MD

## 2015-12-10 NOTE — Assessment & Plan Note (Signed)
S: well controlled on atorvastatin 20mg  with LDL in 2017 of 69 (will scan in records). No myalgias.   A/P: refilled medication. Follow up in march with labs (last 05/10/15)

## 2015-12-10 NOTE — Patient Instructions (Addendum)
We will get records  Thanks for getting your flu shot  Everything else looks great- refilled cholesterol for a year.   Physical 05/09/16 or later. Come in fasting and we will update your labs at that time.

## 2015-12-12 ENCOUNTER — Encounter: Payer: Self-pay | Admitting: Family Medicine

## 2016-01-29 NOTE — Addendum Note (Signed)
Addended by: Lianne Cure A on: 01/29/2016 03:58 PM   Modules accepted: Orders

## 2016-03-25 DIAGNOSIS — K635 Polyp of colon: Secondary | ICD-10-CM | POA: Diagnosis not present

## 2016-03-25 DIAGNOSIS — K5730 Diverticulosis of large intestine without perforation or abscess without bleeding: Secondary | ICD-10-CM | POA: Diagnosis not present

## 2016-03-25 DIAGNOSIS — Z86010 Personal history of colonic polyps: Secondary | ICD-10-CM | POA: Diagnosis not present

## 2016-03-25 DIAGNOSIS — K640 First degree hemorrhoids: Secondary | ICD-10-CM | POA: Diagnosis not present

## 2016-03-27 DIAGNOSIS — K635 Polyp of colon: Secondary | ICD-10-CM | POA: Diagnosis not present

## 2016-03-31 LAB — HM COLONOSCOPY

## 2016-04-09 ENCOUNTER — Other Ambulatory Visit: Payer: Self-pay | Admitting: Otolaryngology

## 2016-04-09 ENCOUNTER — Ambulatory Visit
Admission: RE | Admit: 2016-04-09 | Discharge: 2016-04-09 | Disposition: A | Discharge status: Home / Self Care | Payer: Medicare Other | Source: Ambulatory Visit | Attending: Otolaryngology | Admitting: Otolaryngology

## 2016-04-09 DIAGNOSIS — J320 Chronic maxillary sinusitis: Secondary | ICD-10-CM | POA: Diagnosis not present

## 2016-04-09 DIAGNOSIS — J370 Chronic laryngitis: Secondary | ICD-10-CM | POA: Diagnosis not present

## 2016-04-09 DIAGNOSIS — J329 Chronic sinusitis, unspecified: Secondary | ICD-10-CM

## 2016-04-09 DIAGNOSIS — R51 Headache: Secondary | ICD-10-CM | POA: Diagnosis not present

## 2016-04-09 DIAGNOSIS — J322 Chronic ethmoidal sinusitis: Secondary | ICD-10-CM | POA: Diagnosis not present

## 2016-04-11 ENCOUNTER — Encounter: Payer: Self-pay | Admitting: Family Medicine

## 2016-04-11 ENCOUNTER — Ambulatory Visit (INDEPENDENT_AMBULATORY_CARE_PROVIDER_SITE_OTHER): Payer: PPO | Admitting: Family Medicine

## 2016-04-11 VITALS — BP 104/60 | HR 58 | Temp 97.9°F | Ht 69.0 in | Wt 163.5 lb

## 2016-04-11 DIAGNOSIS — J989 Respiratory disorder, unspecified: Secondary | ICD-10-CM

## 2016-04-11 DIAGNOSIS — M542 Cervicalgia: Secondary | ICD-10-CM | POA: Diagnosis not present

## 2016-04-11 NOTE — Progress Notes (Signed)
Pre visit review using our clinic review tool, if applicable. No additional management support is needed unless otherwise documented below in the visit note. 

## 2016-04-11 NOTE — Progress Notes (Signed)
HPI: Charles Stephenson is a pleasant 82 year old here for follow up of "a sinus infection."  Reports had neck and head pain, sinus congestion, sneezing, cough and sinus pressure 5 days ago. Saw his ENT and they did neck xrays and sinus xrays and told him he had maybe a mild flu and a sinus infection. Reports they told him the xrays were fine. He was given a zpack and probiotic and reports now feels much better. Minimal muscle soreness in sob occ region bilat. No further sinus pain or headache, fevers, SOB.  ROS: See pertinent positives and negatives per HPI.  Past Medical History:  Diagnosis Date  . AAA (abdominal aortic aneurysm) ([place])   . Diverticulitis    3x through 18 months around 2012  . H/O hypercholesterolemia   . History of alcohol abuse    sober since age 59  . History of vertigo    december 2007   . Seasonal allergies    no rx    Past Surgical History:  Procedure Laterality Date  . BACK SURGERY  1984   ruptured disc  . bilateral cataract     L pupil larger after lens implant  . CARDIOVASCULAR STRESS TEST  Jan. 14,2014  . HERNIA REPAIR  2005  . SKIN CANCER EXCISION  09/05/2014   [facility]-  Nose. 9 total     Family History  Problem Relation Age of Onset  . AAA (abdominal aortic aneurysm) Father     sounds like died of DIC- otherwise healthy  . Peripheral vascular disease Father     Aneurysm- Aorta  . AAA (abdominal aortic aneurysm) Brother     states multiple medicle issues but died of pulmonary fibrosis- was told smoking related  . Heart disease Brother 10    Before age 65  . Heart attack Brother     x2  . Alcohol abuse Brother     sober 25 years when he died  . Prostate cancer Brother   . Cancer Mother     Stomach    Social History   Social History  . Marital status: Married    Spouse name: N/A  . Number of children: N/A  . Years of education: N/A   Social History Main Topics  . Smoking status: Never Smoker  . Smokeless tobacco: Never Used  .  Alcohol use No  . Drug use: No  . Sexual activity: Not Asked   Other Topics Concern  . None   Social History Narrative   Married to wife Delwyn Lucibello (patient of mine). 2 kids. No grandkids. No pets.       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  atorvastatin (LIPITOR) 20 MG tablet, Take 1 tablet (20 mg total) by mouth daily at 6 PM., Disp: 90 tablet, Rfl: 3 .  Cholecalciferol (VITAMIN D3) 2000 units capsule, Take 2,000 Units by mouth daily., Disp: , Rfl:  .  Multiple Vitamins-Minerals (CENTRUM SILVER PO), Take 1 tablet by mouth daily. , Disp: , Rfl:  .  psyllium (METAMUCIL SMOOTH TEXTURE) 28 % packet, Take 1 packet by mouth daily., Disp: , Rfl:   EXAM:  Vitals:   04/11/16 1324  BP: 104/60  Pulse: (!) 58  Temp: 97.9 F (36.6 C)    Body  mass index is 24.14 kg/m.  GENERAL: vitals reviewed and listed above, alert, oriented, appears well hydrated and in no acute distress  HEENT: atraumatic, conjunttiva clear, no obvious abnormalities on inspection of external nose and ears, normal appearance of ear canals and TMs, clear nasal congestion, mild post oropharyngeal erythema with PND, no tonsillar edema or exudate, no sinus TTP  NECK: no obvious masses on inspection, normal ROM, no meningeal signs, minimal TTP in sob occ muscles bilat  LUNGS: clear to auscultation bilaterally, no wheezes, rales or rhonchi, good air movement  CV: HRRR, no peripheral edema  MS: moves all extremities without noticeable abnormality  PSYCH: pleasant and cooperative, no obvious depression or anxiety  ASSESSMENT AND PLAN:  Discussed the following assessment and plan:  Respiratory illness  Neck pain  -glad he is feeling better -muscle soreness may have most likely been from driving he did at the time or possible viral  illness that preceded the sinus infection -in any case his is doing much better -Patient advised to return or notify a doctor immediately if symptoms worsen or persist or new concerns arise.  Patient Instructions  Elizebeth Koller the antibiotic.  Heat and aleve as needed if any further neck pain.  Follow up with Dr. Yong Channel if any symptoms worsen or persist.  It was nice to meet you today.   Colin Benton R., DO

## 2016-04-11 NOTE — Patient Instructions (Signed)
Finish the antibiotic.  Heat and aleve as needed if any further neck pain.  Follow up with Dr. Yong Channel if any symptoms worsen or persist.  It was nice to meet you today.

## 2016-04-21 ENCOUNTER — Encounter: Payer: Self-pay | Admitting: Family Medicine

## 2016-04-21 DIAGNOSIS — Z1211 Encounter for screening for malignant neoplasm of colon: Secondary | ICD-10-CM | POA: Insufficient documentation

## 2016-05-27 ENCOUNTER — Encounter: Payer: Self-pay | Admitting: Family

## 2016-05-27 DIAGNOSIS — L814 Other melanin hyperpigmentation: Secondary | ICD-10-CM | POA: Diagnosis not present

## 2016-05-27 DIAGNOSIS — L821 Other seborrheic keratosis: Secondary | ICD-10-CM | POA: Diagnosis not present

## 2016-05-27 DIAGNOSIS — C44519 Basal cell carcinoma of skin of other part of trunk: Secondary | ICD-10-CM | POA: Diagnosis not present

## 2016-05-27 DIAGNOSIS — Z85828 Personal history of other malignant neoplasm of skin: Secondary | ICD-10-CM | POA: Diagnosis not present

## 2016-05-27 DIAGNOSIS — L111 Transient acantholytic dermatosis [Grover]: Secondary | ICD-10-CM | POA: Diagnosis not present

## 2016-05-27 DIAGNOSIS — L570 Actinic keratosis: Secondary | ICD-10-CM | POA: Diagnosis not present

## 2016-05-27 DIAGNOSIS — L853 Xerosis cutis: Secondary | ICD-10-CM | POA: Diagnosis not present

## 2016-05-29 ENCOUNTER — Ambulatory Visit (INDEPENDENT_AMBULATORY_CARE_PROVIDER_SITE_OTHER): Payer: PPO | Admitting: Family Medicine

## 2016-05-29 ENCOUNTER — Encounter: Payer: Self-pay | Admitting: Family Medicine

## 2016-05-29 VITALS — BP 124/78 | HR 58 | Temp 97.6°F | Ht 69.75 in | Wt 162.2 lb

## 2016-05-29 DIAGNOSIS — E785 Hyperlipidemia, unspecified: Secondary | ICD-10-CM

## 2016-05-29 DIAGNOSIS — I714 Abdominal aortic aneurysm, without rupture, unspecified: Secondary | ICD-10-CM

## 2016-05-29 DIAGNOSIS — R42 Dizziness and giddiness: Secondary | ICD-10-CM | POA: Insufficient documentation

## 2016-05-29 DIAGNOSIS — Z0000 Encounter for general adult medical examination without abnormal findings: Secondary | ICD-10-CM | POA: Diagnosis not present

## 2016-05-29 DIAGNOSIS — Z23 Encounter for immunization: Secondary | ICD-10-CM

## 2016-05-29 LAB — COMPREHENSIVE METABOLIC PANEL WITH GFR
ALT: 18 U/L (ref 0–53)
AST: 24 U/L (ref 0–37)
Albumin: 4.3 g/dL (ref 3.5–5.2)
Alkaline Phosphatase: 55 U/L (ref 39–117)
BUN: 15 mg/dL (ref 6–23)
CO2: 29 meq/L (ref 19–32)
Calcium: 9.5 mg/dL (ref 8.4–10.5)
Chloride: 104 meq/L (ref 96–112)
Creatinine, Ser: 0.88 mg/dL (ref 0.40–1.50)
GFR: 88.63 mL/min
Glucose, Bld: 92 mg/dL (ref 70–99)
Potassium: 4.5 meq/L (ref 3.5–5.1)
Sodium: 140 meq/L (ref 135–145)
Total Bilirubin: 1 mg/dL (ref 0.2–1.2)
Total Protein: 6.8 g/dL (ref 6.0–8.3)

## 2016-05-29 LAB — CBC WITH DIFFERENTIAL/PLATELET
Basophils Absolute: 0 K/uL (ref 0.0–0.1)
Basophils Relative: 0.9 % (ref 0.0–3.0)
Eosinophils Absolute: 0.2 K/uL (ref 0.0–0.7)
Eosinophils Relative: 3.2 % (ref 0.0–5.0)
HCT: 43 % (ref 39.0–52.0)
Hemoglobin: 14.8 g/dL (ref 13.0–17.0)
Lymphocytes Relative: 23 % (ref 12.0–46.0)
Lymphs Abs: 1.2 K/uL (ref 0.7–4.0)
MCHC: 34.3 g/dL (ref 30.0–36.0)
MCV: 91.4 fl (ref 78.0–100.0)
Monocytes Absolute: 0.6 K/uL (ref 0.1–1.0)
Monocytes Relative: 12.7 % — ABNORMAL HIGH (ref 3.0–12.0)
Neutro Abs: 3 K/uL (ref 1.4–7.7)
Neutrophils Relative %: 60.2 % (ref 43.0–77.0)
Platelets: 166 K/uL (ref 150.0–400.0)
RBC: 4.71 Mil/uL (ref 4.22–5.81)
RDW: 13.4 % (ref 11.5–15.5)
WBC: 5 K/uL (ref 4.0–10.5)

## 2016-05-29 LAB — LIPID PANEL
Cholesterol: 150 mg/dL (ref 0–200)
HDL: 63.3 mg/dL (ref >39.00)
LDL Cholesterol: 77 mg/dL (ref 0–99)
NonHDL: 86.4
Total CHOL/HDL Ratio: 2
Triglycerides: 47 mg/dL (ref 0.0–149.0)
VLDL: 9.4 mg/dL (ref 0.0–40.0)

## 2016-05-29 NOTE — Patient Instructions (Addendum)
Prevnar 13. Final pneumonia shot next year.   You are doing great!   Please stop by lab before you go   I would also like for you to sign up for an annual wellness visit with our nurse, Manuela Schwartz, who specializes in the annual wellness exam. This is a free benefit under medicare that may help [place] find additional ways to help you. Some highlights are reviewing medications, lifestyle, and doing a dementia screen.   ______________________________________________________________________  Starting October 1st 2018, I will be transferring to our new location: [facility] [place] (corner of [address] from [employer]) [place], [place] Phone: [phone]  I would love to have you remain my patient at this new location as long as it remains convenient for you. I am excited about the opportunity to have x-ray and sports medicine in the new building but will really miss the awesome staff and physicians at [facility]. Continue to schedule appointments at [facility] and we will automatically transfer them to the [place] location starting October 1st.

## 2016-05-29 NOTE — Progress Notes (Addendum)
Phone: [phone]  Subjective:  Patient presents today for their annual physical. Chief complaint-noted.   See problem oriented charting- ROS- full  review of systems was completed and negative except for: very mild lightheadedness at times often with bending over and coming back up- no palpitations, chest pain, shortness of breath with this. Very rare chest pain every 2-3 months- had stress test a year ago and low risk- no worsening pattern. Never had exertional chest pain.   The following were reviewed and entered/updated in epic: Past Medical History:  Diagnosis Date  . AAA (abdominal aortic aneurysm) ([place])   . Diverticulitis    3x through 18 months around 2012  . H/O hypercholesterolemia   . History of alcohol abuse    sober since age 52  . History of vertigo    december 2007   . Seasonal allergies    no rx   Patient Active Problem List   Diagnosis Date Noted  . History of basal cell carcinoma of skin 12/10/2015    Priority: Medium  . Hyperlipidemia 03/12/2012    Priority: Medium  . AAA (abdominal aortic aneurysm) ([place]) 03/12/2012    Priority: Medium  . Vertigo 05/29/2016    Priority: Low  . Colon cancer screening 04/21/2016    Priority: Low  . Seasonal allergies     Priority: Low  . Chest pain 03/12/2012    Priority: Low   Past Surgical History:  Procedure Laterality Date  . BACK SURGERY  1984   ruptured disc  . bilateral cataract     L pupil larger after lens implant  . CARDIOVASCULAR STRESS TEST  Jan. 14,2014  . HERNIA REPAIR  2005  . SKIN CANCER EXCISION  09/05/2014   BCC-  Nose. 9 total     Family History  Problem Relation Age of Onset  . AAA (abdominal aortic aneurysm) Father     sounds like died of DIC- otherwise healthy  . Peripheral vascular disease Father     Aneurysm- Aorta  . AAA (abdominal aortic aneurysm) Brother     states multiple medicle issues but died of pulmonary fibrosis- was told smoking related  . Heart disease Brother 21   Before age 33  . Heart attack Brother     x2  . Alcohol abuse Brother     sober 25 years when he died  . Prostate cancer Brother   . Cancer Mother     Stomach    Medications- reviewed and updated Current Outpatient Prescriptions  Medication Sig Dispense Refill  . atorvastatin (LIPITOR) 20 MG tablet Take 1 tablet (20 mg total) by mouth daily at 6 PM. 90 tablet 3  . Cholecalciferol (VITAMIN D3) 2000 units capsule Take 2,000 Units by mouth daily.    . Multiple Vitamins-Minerals (CENTRUM SILVER PO) Take 1 tablet by mouth daily.     . psyllium (METAMUCIL SMOOTH TEXTURE) 28 % packet Take 1 packet by mouth daily.     No current facility-administered medications for this visit.     Allergies-reviewed and updated No Known Allergies  Social History   Social History  . Marital status: Married    Spouse name: N/A  . Number of children: N/A  . Years of education: N/A   Social History Main Topics  . Smoking status: Never Smoker  . Smokeless tobacco: Never Used  . Alcohol use No  . Drug use: No  . Sexual activity: Not Asked   Other Topics Concern  . None   Social History Narrative  Married to wife Dreux Mcgroarty (patient of mine). 2 kids. No grandkids. No pets.       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 Oropharynx normal Neck: no thyromegaly CV: RRR no murmurs rubs or gallops Lungs: CTAB no crackles, wheeze, rhonchi Abdomen: soft/nontender/nondistended/normal bowel sounds. No rebound or guarding. Appropriate weight Ext: no edema Skin: warm,  dry Neuro: grossly normal, moves all extremities, PERRLA  Assessment/Plan:  82 y.o. male presenting for annual physical.  Health Maintenance counseling: 1. Anticipatory guidance: Patient counseled regarding regular dental exams q6 months, eye exams every 2 years, wearing seatbelts.  2. Risk factor reduction:  Advised patient of need for regular exercise and diet rich and fruits and vegetables to reduce risk of heart attack and stroke. Exercise- play golf 3x a week also does a lot of walking on purpose. Diet-reasonable weight.  Wt Readings from Last 3 Encounters:  05/29/16 162 lb 3.2 oz (73.6 kg)  04/11/16 163 lb 8 oz (74.2 kg)  12/10/15 158 lb 9.6 oz (71.9 kg)  3. Immunizations/screenings/ancillary studies Immunization History  Administered Date(s) Administered  . Influenza, High Dose Seasonal PF 12/10/2015  . Influenza-Unspecified 12/01/2012   Health Maintenance Due  Topic Date Due  . Samul Dada - can get at pharmacy, declines unless cut or scrape 09/10/1953  . PNA vac Low Risk Adult (1 of 2 - PCV13)- today 09/09/2001   4. Prostate cancer screening- nocturia related to coffee intake only. Opted out unless more symptomatic  Lab Results  Component Value Date   PSA 1.0 05/10/2015   5. Colon cancer screening - 03/25/16 per Dr. Alferd [facility] 6. Skin cancer screening- [place] [facility] Dr. Wray Kearns every 6 months  Status of chronic or acute concerns  HLD- on atorvastatin 20mg . Need to update lipids  AAA (abdominal aortic aneurysm) ([facility]) AAA followed by vascular surgery- plan is q6 months due to prior enlargement  Orders Placed This Encounter  Procedures  . Comprehensive metabolic panel    [place]    Order Specific Question:   Has the patient fasted?    Answer:   No  . Lipid panel    [place]    Order Specific Question:   Has the patient fasted?    Answer:   No  . CBC with Differential/Platelet  . HM COLONOSCOPY    This external order was created through the Results  Console.   Return precautions advised.  Garret Reddish, MD

## 2016-05-29 NOTE — Addendum Note (Signed)
Addended by: Lyndle Herrlich on: 05/29/2016 08:50 AM   Modules accepted: Orders

## 2016-05-29 NOTE — Progress Notes (Signed)
Pre visit review using our clinic review tool, if applicable. No additional management support is needed unless otherwise documented below in the visit note. 

## 2016-05-30 MED ORDER — ATORVASTATIN CALCIUM 20 MG PO TABS: 20.0000 mg | ORAL_TABLET | Freq: Every day | ORAL | 3 refills | Status: DC

## 2016-05-30 NOTE — Addendum Note (Signed)
Addended by: Marin Olp on: 05/30/2016 09:30 AM   Modules accepted: Orders

## 2016-05-30 NOTE — Assessment & Plan Note (Signed)
AAA followed by vascular surgery- plan is q6 months due to prior enlargement

## 2016-06-05 ENCOUNTER — Ambulatory Visit (INDEPENDENT_AMBULATORY_CARE_PROVIDER_SITE_OTHER): Payer: PPO | Admitting: Family

## 2016-06-05 ENCOUNTER — Encounter: Payer: Self-pay | Admitting: Family

## 2016-06-05 ENCOUNTER — Ambulatory Visit (HOSPITAL_COMMUNITY)
Admission: RE | Admit: 2016-06-05 | Discharge: 2016-06-05 | Disposition: A | Discharge status: Home / Self Care | Payer: PPO | Source: Ambulatory Visit | Attending: Vascular Surgery | Admitting: Vascular Surgery

## 2016-06-05 ENCOUNTER — Ambulatory Visit (INDEPENDENT_AMBULATORY_CARE_PROVIDER_SITE_OTHER)
Admission: RE | Admit: 2016-06-05 | Discharge: 2016-06-05 | Disposition: A | Discharge status: Home / Self Care | Payer: PPO | Source: Ambulatory Visit | Attending: Family | Admitting: Family

## 2016-06-05 VITALS — BP 146/85 | HR 52 | Temp 97.2°F | Resp 18 | Ht 70.0 in | Wt 158.0 lb

## 2016-06-05 DIAGNOSIS — Z8249 Family history of ischemic heart disease and other diseases of the circulatory system: Secondary | ICD-10-CM

## 2016-06-05 DIAGNOSIS — I714 Abdominal aortic aneurysm, without rupture, unspecified: Secondary | ICD-10-CM

## 2016-06-05 DIAGNOSIS — R0989 Other specified symptoms and signs involving the circulatory and respiratory systems: Secondary | ICD-10-CM

## 2016-06-05 DIAGNOSIS — I6523 Occlusion and stenosis of bilateral carotid arteries: Secondary | ICD-10-CM | POA: Diagnosis not present

## 2016-06-05 LAB — VAS US CAROTID
LEFT ECA DIAS: -17 cm/s
LEFT VERTEBRAL DIAS: -10 cm/s
Left CCA dist dias: 25 cm/s
Left CCA dist sys: 92 cm/s
Left CCA prox dias: 24 cm/s
Left CCA prox sys: 93 cm/s
Left ICA prox dias: -20 cm/s
Left ICA prox sys: -67 cm/s
RIGHT CCA MID DIAS: 26 cm/s
RIGHT ECA DIAS: -17 cm/s
RIGHT VERTEBRAL DIAS: 11 cm/s
Right CCA prox dias: 24 cm/s
Right CCA prox sys: 94 cm/s
Right cca dist sys: -76 cm/s

## 2016-06-05 NOTE — Patient Instructions (Addendum)
Abdominal Aortic Aneurysm Blood pumps away from the heart through tubes (blood vessels) called arteries. Aneurysms are weak or damaged places in the wall of an artery. It bulges out like a balloon. An abdominal aortic aneurysm happens in the main artery of the body (aorta). It can burst or tear, causing bleeding inside the body. This is an emergency. It needs treatment right away. What are the causes? The exact cause is unknown. Things that could cause this problem include:  Fat and other substances building up in the lining of a tube.  Swelling of the walls of a blood vessel.  Certain tissue diseases.  Belly (abdominal) trauma.  An infection in the main artery of the body. What increases the risk? There are things that make it more likely for you to have an aneurysm. These include:  Being over the age of 80 years old.  Having high blood pressure (hypertension).  Being a male.  Being white.  Being very overweight (obese).  Having a family history of aneurysm.  Using tobacco products. What are the signs or symptoms? Symptoms depend on the size of the aneurysm and how fast it grows. There may not be symptoms. If symptoms occur, they can include:  Pain (belly, side, lower back, or groin).  Feeling full after eating a small amount of food.  Feeling sick to your stomach (nauseous), throwing up (vomiting), or both.  Feeling a lump in your belly that feels like it is beating (pulsating).  Feeling like you will pass out (faint). How is this treated?  Medicine to control blood pressure and pain.  Imaging tests to see if the aneurysm gets bigger.  Surgery. How is this prevented? To lessen your chance of getting this condition:  Stop smoking. Stop chewing tobacco.  Limit or avoid alcohol.  Keep your blood pressure, blood sugar, and cholesterol within normal limits.  Eat less salt.  Eat foods low in saturated fats and cholesterol. These are found in animal and whole  dairy products.  Eat more fiber. Fiber is found in whole grains, vegetables, and fruits.  Keep a healthy weight.  Stay active and exercise often. This information is not intended to replace advice given to you by your health care provider. Make sure you discuss any questions you have with your health care provider. Document Released: 06/14/2012 Document Revised: 07/26/2015 Document Reviewed: 03/19/2012 Elsevier Interactive Patient Education  2017 Elsevier Inc.      Before your next abdominal ultrasound:  Take two Extra-Strength Gas-X capsules at bedtime the night before the test. Take another two Extra-Strength Gas-X capsules 3 hours before the test.   

## 2016-06-05 NOTE — Progress Notes (Signed)
[facility]   CC: Follow up Abdominal Aortic Aneurysm  History of Present Illness  Charles Stephenson is a 82 y.o. (1934-04-20) male patient of Dr. Donnetta Hutching who presents with chief complaint: follow up for AAA.  His AAA was incidentally found during routine physical exam by palpation.  Previous studies demonstrate an AAA measuring 4.0 cm on 03/23/13, 3.5 cm in January 2011 as earliest on file (review of records).  He has prominent popliteal pulses; July 2016 popliteal artery duplex demonstrated no evidence of popliteal artery aneurysms.   The patient does not have back or abdominal pain, even though he has had back surgery.  The patient denies claudication in legs with walking, he walks 3 miles most days/week.  The patient denies history of stroke or TIA symptoms, denies any cardiac problems.   Father died of clotting disorder during surgery for AAA, as pt describes, sounds like possibly DIC.  Pt's brother also has a AAA, had an EVAR placed which became infected, 10 hour surgery to correct this; states his brother was obese, smoked, was in poor health.  He no longer takes a daily ASA as directed by his PCP, pt states he does not need it.  Pt had cardiac stress testing as part of pt's routine exam in March 2017, states it was normal, pt denies any personal history of CAD.  He denies light-headedness, denies chest pain, denies dyspnea.   Pt states his ophthalmologist told him that his left pupil is larger than right due to cataract surgery.   Pt states his blood pressure at home is 120's/70's, states he checks it regularly.   He had right radiculopathy, had ESI's, has resolved.  Pt Diabetic: No Pt smoker: non-smoker He denies any ETOH consumption     Past Medical History:  Diagnosis Date  . AAA (abdominal aortic aneurysm) ([place])   . Diverticulitis    3x through 18 months around 2012  . H/O hypercholesterolemia   . History of  alcohol abuse    sober since age 19  . History of vertigo    december 2007   . Seasonal allergies    no rx   Past Surgical History:  Procedure Laterality Date  . BACK SURGERY  1984   ruptured disc  . bilateral cataract     L pupil larger after lens implant  . CARDIOVASCULAR STRESS TEST  Jan. 14,2014  . HERNIA REPAIR  2005  . SKIN CANCER EXCISION  09/05/2014   BCC-  Nose. 9 total    Social History Social History   Social History  . Marital status: Married    Spouse name: N/A  . Number of children: N/A  . Years of education: N/A   Occupational History  . Not on file.   Social History Main Topics  . Smoking status: Never Smoker  . Smokeless tobacco: Never Used  . Alcohol use No  . Drug use: No  . Sexual activity: Not on file   Other Topics Concern  . Not on file   Social History Narrative   Married to wife Kayveon Lennartz (patient of mine). 2 kids. No grandkids. No pets.       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 AAA (abdominal  aortic aneurysm) Father     sounds like died of DIC- otherwise healthy  . Peripheral vascular disease Father     Aneurysm- Aorta  . AAA (abdominal aortic aneurysm) Brother     states multiple medicle issues but died of pulmonary fibrosis- was told smoking related  . Heart disease Brother 57    Before age 39  . Heart attack Brother     x2  . Alcohol abuse Brother     sober 25 years when he died  . Prostate cancer Brother   . Cancer Mother     Stomach    Current Outpatient Prescriptions on File Prior to Visit  Medication Sig Dispense Refill  . atorvastatin (LIPITOR) 20 MG tablet Take 1 tablet (20 mg total) by mouth daily at 6 PM. 91 tablet 3  . Cholecalciferol (VITAMIN D3) 2000 units capsule Take 2,000 Units by mouth daily.    .  Multiple Vitamins-Minerals (CENTRUM SILVER PO) Take 1 tablet by mouth daily.     . psyllium (METAMUCIL SMOOTH TEXTURE) 28 % packet Take 1 packet by mouth daily.     No current facility-administered medications on file prior to visit.    No Known Allergies  ROS: See HPI for pertinent positives and negatives.  Physical Examination  Vitals:   06/05/16 0900 06/05/16 0903 06/05/16 0904  BP: (!) 159/88 (!) 153/78 (!) 146/85  Pulse: (!) 50  (!) 52  Resp: 18    Temp: 97.2 F (36.2 C)    TempSrc: Oral    SpO2: 99%    Weight: 158 lb (71.7 kg)    Height: 5\' 10"  (1.778 m)     Body mass index is 22.67 kg/m.  General: A&O x 3, WD, fit appearing.  Eyes: Left pupil is larger than right. Pulmonary: Sym exp, respirations are non labored, good air movt, CTAB, no rales, rhonchi, or wheezing.  Cardiac: Regular rhythm, bradycardic rate, no murmur detected.   Carotid Bruits Left Right   positive Negative    Aorta is is palpable  Radial pulses are 3+ palpable and =   VASCULAR EXAM: LE Pulses  LEFT  RIGHT   FEMORAL  3+palpable 3+palpable   POPLITEAL  2+ palpable  3+palpable  POSTERIOR TIBIAL  1+palpable  1+palpable   DORSALIS PEDIS ANTERIOR TIBIAL  2+palpable  2+palpable    Gastrointestinal: soft, NTND, -G/R, - HSM, - palpable masses, - CVAT B.  Musculoskeletal: M/S 5/5 throughout, Extremities without ischemic changes.  Neurologic: CN 2-12 intact, Pain and light touch intact in extremities, Motor exam as listed above.    Non-Invasive Vascular Imaging  AAA Duplex (06/05/2016)  Previous size: 4.2 cm (Date: 11-29-15)  Current size:  4.3 cm (Date: 06/05/16), Right CIA: 1.3 cm, Left CIA: 1.5 cm  Carotid Duplex (06-05-16): <40% stenosis of the bilateral ICA. Bilateral vertebral artery flow is antegrade.  Bilateral subclavian artery waveforms are normal.  No prior exam for comparison.  Medical Decision  Making  The patient is a 82 y.o. male who presents with asymptomatic AAA with no significant increase in size; maximum diameter today is 4.3 cm at the distal abdominal aorta. Bilateral common iliac artery calibers are normal.   No need to recheck carotid duplex until about 5 years since there was <40% bilateral ICA stenosis.    Based on this patient's exam and diagnostic studies, the patient will follow up in 9 months with the following studies: AAA duplex.  Consideration for repair of AAA would be made when the size is  5.0 cm, growth > 1 cm/yr, and symptomatic status.  I emphasized the importance of maximal medical management including strict control of blood pressure, blood glucose, and lipid levels, antiplatelet agents, obtaining regular exercise, and continued cessation of smoking.   The patient was given information about AAA including signs, symptoms, treatment, and how to minimize the risk of enlargement and rupture of aneurysms.    The patient was advised to call 911 should the patient experience sudden onset abdominal or back pain.   Thank you for allowing [place] to participate in this patient's care.  Clemon Chambers, RN, MSN, FNP-C [facility] Office: [facility] Physician: Oneida Alar  06/05/2016, 10:41 AM

## 2016-06-06 NOTE — Addendum Note (Signed)
Addended by: Lianne Cure A on: 06/06/2016 09:32 AM   Modules accepted: Orders

## 2016-06-09 ENCOUNTER — Ambulatory Visit (INDEPENDENT_AMBULATORY_CARE_PROVIDER_SITE_OTHER): Payer: PPO | Admitting: Family Medicine

## 2016-06-09 ENCOUNTER — Encounter: Payer: Self-pay | Admitting: Family Medicine

## 2016-06-09 VITALS — BP 120/68 | HR 71 | Temp 97.7°F | Ht 70.0 in | Wt 163.4 lb

## 2016-06-09 DIAGNOSIS — J329 Chronic sinusitis, unspecified: Secondary | ICD-10-CM

## 2016-06-09 DIAGNOSIS — B9789 Other viral agents as the cause of diseases classified elsewhere: Secondary | ICD-10-CM

## 2016-06-09 MED ORDER — PREDNISONE 20 MG PO TABS: ORAL_TABLET | ORAL | 0 refills | Status: DC

## 2016-06-09 MED ORDER — FLUTICASONE PROPIONATE 50 MCG/ACT NA SUSP: 2.0000 | Freq: Every day | NASAL | 6 refills | Status: DC

## 2016-06-09 NOTE — Progress Notes (Signed)
PCP: Garret Reddish, MD  Subjective:  Charles Stephenson is a 82 y.o. year old very pleasant male patient who presents with sinusitis symptoms including nasal congestion, sinus tenderness mild maxillayr -other symptoms include: rhinorrhea with clear discharge. Feels like film on eyes though denies watering- normal vision. Dry cough.  -day of illness:5. Started after working in the yard all day long last wednesday -Symptoms show no change -previous treatments: OTC allergy medicine -sick contacts/travel/risks: denies flu exposure.  -Hx of: allergies on no regular rx  ROS-denies fever, SOB, NVD, tooth pain  Pertinent Past Medical History-  Patient Active Problem List   Diagnosis Date Noted  . History of basal cell carcinoma of skin 12/10/2015    Priority: Medium  . Hyperlipidemia 03/12/2012    Priority: Medium  . AAA (abdominal aortic aneurysm) ([place]) 03/12/2012    Priority: Medium  . Vertigo 05/29/2016    Priority: Low  . Colon cancer screening 04/21/2016    Priority: Low  . Seasonal allergies     Priority: Low  . Chest pain 03/12/2012    Priority: Low    Medications- reviewed  Current Outpatient Prescriptions  Medication Sig Dispense Refill  . atorvastatin (LIPITOR) 20 MG tablet Take 1 tablet (20 mg total) by mouth daily at 6 PM. 91 tablet 3  . Cholecalciferol (VITAMIN D3) 2000 units capsule Take 2,000 Units by mouth daily.    . Multiple Vitamins-Minerals (CENTRUM SILVER PO) Take 1 tablet by mouth daily.     . psyllium (METAMUCIL SMOOTH TEXTURE) 28 % packet Take 1 packet by mouth daily.     No current facility-administered medications for this visit.     Objective: BP 120/68 (BP Location: Left Arm, Patient Position: Sitting, Cuff Size: Large)   Pulse 71   Temp 97.7 F (36.5 C) (Oral)   Ht 5\' 10"  (1.778 m)   Wt 163 lb 6.4 oz (74.1 kg)   SpO2 98%   BMI 23.45 kg/m  Gen: NAD, resting comfortably HEENT: Turbinates erythematous with yellow drainage, TM normal, pharynx  mildly erythematous with no tonsilar exudate or edema, mild maxillary bilateral sinus tenderness CV: RRR no murmurs rubs or gallops Lungs: CTAB no crackles, wheeze, rhonchi Abdomen: soft/nontender/nondistended/normal bowel sounds. No rebound or guarding.  Ext: no edema Skin: warm, dry, no rash Neuro: grossly normal, moves all extremities  Assessment/Plan:  Sinsusitis Viral based on <10 days, no double sickening, lack of severity of symptoms in first 3 days. we discussed signs that bacterial infection may have developed. Also as worsened in day when he was out in the yard a lot- suspect may have allergic element  Treatment: -considered steroid: we opted in.  -other symptomatic care with mucinex or mucinex- DM if cough bothers him. Also will use flonase for at least a week for the potentially allergic element.  -Antibiotic indicated: no but if symptoms last until next Monday then agreed to call in augmentin antibiotic  Finally, we reviewed reasons to return to care including if symptoms worsen or persist or new concerns arise (particularly fever or shortness of breath)  Meds ordered this encounter  Medications  . fluticasone (FLONASE) 50 MCG/ACT nasal spray    Sig: Place 2 sprays into both nostrils daily.    Dispense:  16 g    Refill:  6  . predniSONE (DELTASONE) 20 MG tablet    Sig: Take 1 tablet by mouth daily for 5 days, then 1/2 tablet daily for 2 days    Dispense:  6 tablet  Refill:  0    Garret Reddish, MD

## 2016-06-09 NOTE — Patient Instructions (Signed)
Sinsusitis Viral based on <10 days, no double sickening, lack of severity of symptoms in first 3 days. we discussed signs that bacterial infection may have developed. Also as worsened in day when he was out in the yard a lot- suspect may have allergic element  Treatment: -considered steroid: we opted in.  -other symptomatic care with mucinex or mucinex- DM if cough bothers him -Antibiotic indicated: no but if symptoms last until next Monday then agreed to call in augmentin antibiotic  Finally, we reviewed reasons to return to care including if symptoms worsen or persist or new concerns arise (particularly fever or shortness of breath)  Meds ordered this encounter  Medications  . fluticasone (FLONASE) 50 MCG/ACT nasal spray    Sig: Place 2 sprays into both nostrils daily.    Dispense:  16 g    Refill:  6  . predniSONE (DELTASONE) 20 MG tablet    Sig: Take 1 tablet by mouth daily for 5 days, then 1/2 tablet daily for 2 days    Dispense:  6 tablet    Refill:  0

## 2016-06-09 NOTE — Progress Notes (Signed)
Pre visit review using our clinic review tool, if applicable. No additional management support is needed unless otherwise documented below in the visit note. 

## 2016-06-24 ENCOUNTER — Ambulatory Visit (INDEPENDENT_AMBULATORY_CARE_PROVIDER_SITE_OTHER): Payer: PPO | Admitting: Family Medicine

## 2016-06-24 ENCOUNTER — Encounter: Payer: Self-pay | Admitting: Family Medicine

## 2016-06-24 VITALS — BP 124/70 | HR 57 | Temp 98.5°F | Wt 163.6 lb

## 2016-06-24 DIAGNOSIS — J329 Chronic sinusitis, unspecified: Secondary | ICD-10-CM | POA: Diagnosis not present

## 2016-06-24 DIAGNOSIS — B9689 Other specified bacterial agents as the cause of diseases classified elsewhere: Secondary | ICD-10-CM

## 2016-06-24 DIAGNOSIS — J351 Hypertrophy of tonsils: Secondary | ICD-10-CM | POA: Diagnosis not present

## 2016-06-24 MED ORDER — AMOXICILLIN-POT CLAVULANATE 875-125 MG PO TABS: 1.0000 | ORAL_TABLET | Freq: Two times a day (BID) | ORAL | 0 refills | Status: DC

## 2016-06-24 NOTE — Progress Notes (Signed)
PCP: Garret Reddish, MD  Subjective:  Charles Stephenson is a 82 y.o. year old very pleasant male patient who presents with sinusitis symptoms including nasal congestion, sinus tenderness, nasal drainage.  -other symptoms include: right sided tonsilar soreness and mildest of discomfort with swallowing -day of illness:about 20 days -Symptoms show no change despite course of flonase and prednisone then restarting claritin and flonase in last 5 days -sick contacts/travel/risks: denies flu exposure. Wife is not sick -no clea rHx of: allergies  ROS-denies fever, SOB, NVD, tooth pain  Pertinent Past Medical History-  Patient Active Problem List   Diagnosis Date Noted  . History of basal cell carcinoma of skin 12/10/2015    Priority: Medium  . Hyperlipidemia 03/12/2012    Priority: Medium  . AAA (abdominal aortic aneurysm) ([place]) 03/12/2012    Priority: Medium  . Vertigo 05/29/2016    Priority: Low  . Colon cancer screening 04/21/2016    Priority: Low  . Seasonal allergies     Priority: Low  . Chest pain 03/12/2012    Priority: Low    Medications- reviewed  Current Outpatient Prescriptions  Medication Sig Dispense Refill  . atorvastatin (LIPITOR) 20 MG tablet Take 1 tablet (20 mg total) by mouth daily at 6 PM. 91 tablet 3  . Cholecalciferol (VITAMIN D3) 2000 units capsule Take 2,000 Units by mouth daily.    . Multiple Vitamins-Minerals (CENTRUM SILVER PO) Take 1 tablet by mouth daily.     . psyllium (METAMUCIL SMOOTH TEXTURE) 28 % packet Take 1 packet by mouth daily.      Objective: BP 124/70 (BP Location: Left Arm, Patient Position: Sitting, Cuff Size: Large)   Pulse (!) 57   Temp 98.5 F (36.9 C) (Oral)   Wt 163 lb 9.6 oz (74.2 kg)   SpO2 96%   BMI 23.47 kg/m  Gen: NAD, resting comfortably, does not appear acutely ill HEENT: Turbinates erythematous with yellow drainage as well as some crusted darker areas of drainage, TM normal, pharynx mildly erythematous with no  tonsilar exudate or edema, both tonsils mildly swollen bilateral maxillary sinus tenderness On exam of neck- right sided tonsil  CV: slightly bradycardic no murmurs rubs or gallops Lungs: CTAB no crackles, wheeze, rhonchi Ext: no edema Skin: warm, dry, no rash  Assessment/Plan:  Sinsusitis Bacterial based on: Symptoms >10 days- actually nearly 20 at this point. Prior trial of treatment for viral sinusitis with prednisone failed. We will add in augmentin for 7 days. He has some right tonsilar soreness- suspect this is inflammed from his untreated infection- hopeful this will improve with treatment but advised to follow up if not.   Finally, we reviewed reasons to return to care including if symptoms worsen or persist or new concerns arise (particularly fever or shortness of breath)  Meds ordered this encounter  Medications  . amoxicillin-clavulanate (AUGMENTIN) 875-125 MG tablet    Sig: Take 1 tablet by mouth 2 (two) times daily.    Dispense:  14 tablet    Refill:  0    Garret Reddish, MD

## 2016-06-24 NOTE — Patient Instructions (Addendum)
Continue claritin and flonase next 10 days  Start Augmentin antibiotic- we thought this was viral last time but now that its well over 10 days- likely bacterial sinus infection  See me back if not better in 10-14 days or sooner if worsens or you develop fever or shortness of breath or trouble swallowing  _________________________________________________ WE NOW OFFER   [facility] FAST TRACK!!!  SAME DAY Appointments for ACUTE CARE  Such as: Sprains, Injuries, cuts, abrasions, rashes, muscle pain, joint pain, back pain Colds, flu, sore throats, headache, allergies, cough, fever  Ear pain, sinus and eye infections Abdominal pain, nausea, vomiting, diarrhea, upset stomach Animal/insect bites  3 Easy Ways to Schedule: Walk-In Scheduling Call in scheduling Mychart Sign-up: [email or web address]

## 2016-11-26 ENCOUNTER — Ambulatory Visit (INDEPENDENT_AMBULATORY_CARE_PROVIDER_SITE_OTHER): Payer: PPO | Admitting: Family Medicine

## 2016-11-26 ENCOUNTER — Encounter: Payer: Self-pay | Admitting: Family Medicine

## 2016-11-26 VITALS — BP 110/80 | HR 85 | Temp 98.1°F | Wt 158.7 lb

## 2016-11-26 DIAGNOSIS — R300 Dysuria: Secondary | ICD-10-CM | POA: Diagnosis not present

## 2016-11-26 DIAGNOSIS — K5900 Constipation, unspecified: Secondary | ICD-10-CM

## 2016-11-26 LAB — POC URINALSYSI DIPSTICK (AUTOMATED)
Glucose, UA: NEGATIVE
Spec Grav, UA: 1.025 (ref 1.010–1.025)
Urobilinogen, UA: 1 E.U./dL
pH, UA: 6 (ref 5.0–8.0)

## 2016-11-26 MED ORDER — POLYETHYLENE GLYCOL 3350 17 GM/SCOOP PO POWD: 17.0000 g | Freq: Every day | ORAL | 1 refills | Status: DC

## 2016-11-26 MED ORDER — CIPROFLOXACIN HCL 500 MG PO TABS: 500.0000 mg | ORAL_TABLET | Freq: Two times a day (BID) | ORAL | 0 refills | Status: DC

## 2016-11-26 NOTE — Patient Instructions (Addendum)
Urinary Tract Infection, Adult A urinary tract infection (UTI) is an infection of any part of the urinary tract. The urinary tract includes the:  Kidneys.  Ureters.  Bladder.  Urethra.  These organs make, store, and get rid of pee (urine) in the body. Follow these instructions at home:  Take over-the-counter and prescription medicines only as told by your doctor.  If you were prescribed an antibiotic medicine, take it as told by your doctor. Do not stop taking the antibiotic even if you start to feel better.  Avoid the following drinks: ? Alcohol. ? Caffeine. ? Tea. ? Carbonated drinks.  Drink enough fluid to keep your pee clear or pale yellow.  Keep all follow-up visits as told by your doctor. This is important.  Make sure to: ? Empty your bladder often and completely. Do not to hold pee for long periods of time. ? Empty your bladder before and after sex. ? Wipe from front to back after a bowel movement if you are male. Use each tissue one time when you wipe. Contact a doctor if:  You have back pain.  You have a fever.  You feel sick to your stomach (nauseous).  You throw up (vomit).  Your symptoms do not get better after 3 days.  Your symptoms go away and then come back. Get help right away if:  You have very bad back pain.  You have very bad lower belly (abdominal) pain.  You are throwing up and cannot keep down any medicines or water. This information is not intended to replace advice given to you by your health care provider. Make sure you discuss any questions you have with your health care provider. Document Released: 08/06/2007 Document Revised: 07/26/2015 Document Reviewed: 01/08/2015 Elsevier Interactive Patient Education  2018 [employer]. Dysuria Dysuria is pain or discomfort while urinating. The pain or discomfort may be felt in the tube that carries urine out of the bladder (urethra) or in the surrounding tissue of the genitals. The pain may  also be felt in the groin area, lower abdomen, and lower back. You may have to urinate frequently or have the sudden feeling that you have to urinate (urgency). Dysuria can affect both men and women, but is more common in women. Dysuria can be caused by many different things, including:  Urinary tract infection in women.  Infection of the kidney or bladder.  Kidney stones or bladder stones.  Certain sexually transmitted infections (STIs), such as chlamydia.  Dehydration.  Inflammation of the vagina.  Use of certain medicines.  Use of certain soaps or scented products that cause irritation.  Follow these instructions at home: Watch your dysuria for any changes. The following actions may help to reduce any discomfort you are feeling:  Drink enough fluid to keep your urine clear or pale yellow.  Empty your bladder often. Avoid holding urine for long periods of time.  After a bowel movement or urination, women should cleanse from front to back, using each tissue only once.  Empty your bladder after sexual intercourse.  Take medicines only as directed by your health care provider.  If you were prescribed an antibiotic medicine, finish it all even if you start to feel better.  Avoid caffeine, tea, and alcohol. They can irritate the bladder and make dysuria worse. In men, alcohol may irritate the prostate.  Keep all follow-up visits as directed by your health care provider. This is important.  If you had any tests done to find the cause of  dysuria, it is your responsibility to obtain your test results. Ask the lab or department performing the test when and how you will get your results. Talk with your health care provider if you have any questions about your results.  Contact a health care provider if:  You develop pain in your back or sides.  You have a fever.  You have nausea or vomiting.  You have blood in your urine.  You are not urinating as often as you usually  do. Get help right away if:  You pain is severe and not relieved with medicines.  You are unable to hold down any fluids.  You or someone else notices a change in your mental function.  You have a rapid heartbeat at rest.  You have shaking or chills.  You feel extremely weak. This information is not intended to replace advice given to you by your health care provider. Make sure you discuss any questions you have with your health care provider. Document Released: 11/16/2003 Document Revised: 07/26/2015 Document Reviewed: 10/13/2013 Elsevier Interactive Patient Education  [employer].

## 2016-11-26 NOTE — Progress Notes (Signed)
Subjective:    Patient ID: Charles Stephenson, male    DOB: 1935/01/24, 82 y.o.   MRN: [id number]  No chief complaint on file.   HPI Patient was seen today for acute concern:  Dysuria: -Mon night after playing golf extremely tired/no energy, burning with urination, chills/shakey.  Typically feels good after playing golf, but grass was overgrown and course was muddy. -Tues noticed increased burning, frequency, urgency, decreased appetite -pt also notes dry mouth and constipation.  Last BM was Sunday.  Pt typically regular qday to qod. -pt has not tried anything for the symptoms.  Pt has never had this happen before. -last PSA 1.0 on 05/10/2015   Past Medical History:  Diagnosis Date  . AAA (abdominal aortic aneurysm) ([place])   . Diverticulitis    3x through 18 months around 2012  . H/O hypercholesterolemia   . History of alcohol abuse    sober since age 18  . History of vertigo    december 2007   . Seasonal allergies    no rx    Past Surgical History:  Procedure Laterality Date  . BACK SURGERY  1984   ruptured disc  . bilateral cataract     L pupil larger after lens implant  . CARDIOVASCULAR STRESS TEST  Jan. 14,2014  . HERNIA REPAIR  2005  . SKIN CANCER EXCISION  09/05/2014   BCC-  Nose. 9 total     Family History  Problem Relation Age of Onset  . AAA (abdominal aortic aneurysm) Father        sounds like died of DIC- otherwise healthy  . Peripheral vascular disease Father        Aneurysm- Aorta  . AAA (abdominal aortic aneurysm) Brother        states multiple medicle issues but died of pulmonary fibrosis- was told smoking related  . Heart disease Brother 31       Before age 64  . Heart attack Brother        x2  . Alcohol abuse Brother        sober 25 years when he died  . Prostate cancer Brother   . Cancer Mother        Stomach    Social History   Social History  . Marital status: Married    Spouse name: N/A  . Number of children: N/A  . Years of  education: N/A   Occupational History  . Not on file.   Social History Main Topics  . Smoking status: Never Smoker  . Smokeless tobacco: Never Used  . Alcohol use No  . Drug use: No  . Sexual activity: Not on file   Other Topics Concern  . Not on file   Social History Narrative   Married to wife Kevork Joyce (patient of mine). 2 kids. No grandkids. No pets.       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 atorvastatin (LIPITOR) 20 MG tablet Take 1 tablet (20 mg total) by mouth daily at 6 PM. 91 tablet 3  . Cholecalciferol (VITAMIN D3) 2000 units capsule Take 2,000 Units by mouth daily.    . Multiple Vitamins-Minerals (CENTRUM SILVER PO) Take 1 tablet by mouth daily.     Marland Kitchen  psyllium (METAMUCIL SMOOTH TEXTURE) 28 % packet Take 1 packet by mouth daily.    Marland Kitchen amoxicillin-clavulanate (AUGMENTIN) 875-125 MG tablet Take 1 tablet by mouth 2 (two) times daily. 14 tablet 0   No facility-administered medications prior to visit.     No Known Allergies  ROS  General: Denies fever, night sweats, changes in weight, changes in appetite  +chills/shakey, fatigue, no appetite, no energy HEENT: Denies headaches, ear pain, changes in vision, rhinorrhea, sore throat  +dry mouth CV: Denies CP, palpitations, SOB, orthopnea Pulm: Denies SOB, cough, wheezing GI: Denies abdominal pain, nausea, vomiting, diarrhea, constipation GU: +dysuria, slight burning with urination, increased frequency and urge, constipation, faint blood in urine Msk: Denies muscle cramps, joint pains Neuro: Denies weakness, numbness, tingling Skin: Denies rashes, bruising Psych: Denies depression, anxiety, hallucinations      Objective:    Blood pressure 110/80, pulse 85, temperature 98.1 F (36.7 C), temperature  source Oral, weight 158 lb 11.2 oz (72 kg).   Gen. Pleasant, well-nourished, in no distress, normal affect  HEENT: [place]/AT, face symmetric, no scleral icterus, PERRLA, EOMI, nares patent without drainage. Lungs: no accessory muscle use, CTAB, no wheezes or rales Cardiovascular: RRR, no m/r/g, no peripheral edema Abdomen: soft and non-tender, no hepatosplenomegaly, BS normal. GU: DRE-normal anus, no erythema, small skin tag.  No stool in rectal vault.  Prostate high, difficult for this provider to reach-however non-boggy, nontender Neuro:  A&Ox3, CN II-XII intact, normal gait Skin:  Warm, no lesions/ rash   Assessment/Plan:  Dysuria  -Prostate exam done: see above for details -UA dirty.  -Will send for UCx -given handouts, increase po intake of water. -Given RTC or ED precautions - Plan: POCT Urinalysis Dipstick (Automated), Urine Culture, ciprofloxacin (CIPRO) 500 MG tablet  Constipation, unspecified constipation type  -encouraged to increase po intake of water and fiber - Plan: polyethylene glycol powder (GLYCOLAX/MIRALAX) powder

## 2016-11-29 LAB — URINE CULTURE
MICRO NUMBER:: 81067129
SPECIMEN QUALITY:: ADEQUATE

## 2016-12-05 ENCOUNTER — Ambulatory Visit (INDEPENDENT_AMBULATORY_CARE_PROVIDER_SITE_OTHER): Payer: PPO | Admitting: *Deleted

## 2016-12-05 DIAGNOSIS — Z23 Encounter for immunization: Secondary | ICD-10-CM | POA: Diagnosis not present

## 2016-12-09 DIAGNOSIS — L570 Actinic keratosis: Secondary | ICD-10-CM | POA: Diagnosis not present

## 2016-12-09 DIAGNOSIS — D225 Melanocytic nevi of trunk: Secondary | ICD-10-CM | POA: Diagnosis not present

## 2016-12-09 DIAGNOSIS — L821 Other seborrheic keratosis: Secondary | ICD-10-CM | POA: Diagnosis not present

## 2016-12-09 DIAGNOSIS — L814 Other melanin hyperpigmentation: Secondary | ICD-10-CM | POA: Diagnosis not present

## 2016-12-09 DIAGNOSIS — Z85828 Personal history of other malignant neoplasm of skin: Secondary | ICD-10-CM | POA: Diagnosis not present

## 2016-12-09 DIAGNOSIS — D1801 Hemangioma of skin and subcutaneous tissue: Secondary | ICD-10-CM | POA: Diagnosis not present

## 2016-12-09 DIAGNOSIS — C4441 Basal cell carcinoma of skin of scalp and neck: Secondary | ICD-10-CM | POA: Diagnosis not present

## 2016-12-09 DIAGNOSIS — L853 Xerosis cutis: Secondary | ICD-10-CM | POA: Diagnosis not present

## 2016-12-09 DIAGNOSIS — D485 Neoplasm of uncertain behavior of skin: Secondary | ICD-10-CM | POA: Diagnosis not present

## 2017-01-26 ENCOUNTER — Telehealth: Payer: Self-pay | Admitting: Family Medicine

## 2017-01-26 ENCOUNTER — Other Ambulatory Visit: Payer: Self-pay

## 2017-01-26 MED ORDER — ATORVASTATIN CALCIUM 20 MG PO TABS: 20.0000 mg | ORAL_TABLET | Freq: Every day | ORAL | 3 refills | Status: DC

## 2017-01-26 NOTE — Telephone Encounter (Signed)
MEDICATION:  atorvastatin (LIPITOR) 20 MG tablet PHARMACY:   [id number] [place] [facility] [place] [place] [address] (Phone) [phone] (Fax)    IS THIS A 90 DAY SUPPLY : Y  IS PATIENT OUT OF MEDICATION: N  IF NOT; HOW MUCH IS LEFT: 2 left  LAST APPOINTMENT DATE: @Visit  date not found  NEXT APPOINTMENT DATE:@Visit  date not found  OTHER COMMENTS: Patient is leaving out of town 01/28/17   **Let patient know to contact pharmacy at the end of the day to make sure medication is ready. **  ** Please notify patient to allow 48-72 hours to process**  **Encourage patient to contact the pharmacy for refills or they can request refills through [facility]**

## 2017-01-26 NOTE — Telephone Encounter (Signed)
Prescription sent to pharmacy as requested.

## 2017-03-10 DIAGNOSIS — Z85828 Personal history of other malignant neoplasm of skin: Secondary | ICD-10-CM | POA: Diagnosis not present

## 2017-03-10 DIAGNOSIS — L218 Other seborrheic dermatitis: Secondary | ICD-10-CM | POA: Diagnosis not present

## 2017-03-10 DIAGNOSIS — L570 Actinic keratosis: Secondary | ICD-10-CM | POA: Diagnosis not present

## 2017-03-11 ENCOUNTER — Ambulatory Visit: Payer: Self-pay | Admitting: Family

## 2017-03-11 ENCOUNTER — Other Ambulatory Visit (HOSPITAL_COMMUNITY): Payer: Self-pay

## 2017-03-13 ENCOUNTER — Ambulatory Visit: Payer: PPO | Admitting: Family

## 2017-03-13 ENCOUNTER — Other Ambulatory Visit (HOSPITAL_COMMUNITY): Payer: PPO

## 2017-03-18 ENCOUNTER — Ambulatory Visit (HOSPITAL_COMMUNITY)
Admission: RE | Admit: 2017-03-18 | Discharge: 2017-03-18 | Disposition: A | Discharge status: Home / Self Care | Payer: PPO | Source: Ambulatory Visit | Attending: Family | Admitting: Family

## 2017-03-18 DIAGNOSIS — I714 Abdominal aortic aneurysm, without rupture, unspecified: Secondary | ICD-10-CM

## 2017-03-18 DIAGNOSIS — Z8249 Family history of ischemic heart disease and other diseases of the circulatory system: Secondary | ICD-10-CM | POA: Insufficient documentation

## 2017-03-19 ENCOUNTER — Other Ambulatory Visit: Payer: Self-pay

## 2017-03-19 ENCOUNTER — Encounter: Payer: Self-pay | Admitting: Family

## 2017-03-19 ENCOUNTER — Ambulatory Visit (INDEPENDENT_AMBULATORY_CARE_PROVIDER_SITE_OTHER): Payer: PPO | Admitting: Family

## 2017-03-19 VITALS — BP 130/75 | HR 54 | Temp 97.0°F | Resp 16 | Ht 71.0 in | Wt 157.0 lb

## 2017-03-19 DIAGNOSIS — I714 Abdominal aortic aneurysm, without rupture, unspecified: Secondary | ICD-10-CM

## 2017-03-19 DIAGNOSIS — Z8249 Family history of ischemic heart disease and other diseases of the circulatory system: Secondary | ICD-10-CM

## 2017-03-19 NOTE — Progress Notes (Signed)
[facility]   CC: Follow up Abdominal Aortic Aneurysm  History of Present Illness  Charles Stephenson is a 83 y.o. ([birth year]) male patient of Dr. Donnetta Hutching who presents with chief complaint: follow up for AAA.  His AAA was incidentally found during routine physical exam by palpation.  Previous studies demonstrate an AAA measuring 4.0 cm on 03/23/13, 3.5 cm in January 2011 as earliest on file (review of records).  He has prominent popliteal pulses; July 2016 popliteal artery duplex demonstrated no evidence of popliteal artery aneurysms.   The patient does not have back or abdominal pain, even though he has had back surgery.  The patient denies claudication in legs with walking, he walks 3 miles most days/week.  The patient denies history of stroke or TIA symptoms, denies any cardiac problems.   Father died of clotting disorder during surgery for AAA, as pt describes, sounds like possibly DIC.  Pt's brother also has a AAA, had an EVAR placed which became infected, 10 hour surgery to correct this; states his brother was obese, smoked, was in poor health.  He no longer takes a daily ASA as directed by his PCP, pt states he does not need it.  Pt hadcardiac stress testing as part of pt's routine exam in March 2017, states it was normal, pt denies any personal history of CAD.  He denies light-headedness, denies chest pain, denies dyspnea.   Pt states his ophthalmologist told him that his left pupil is larger than right due to cataract surgery.   He had right radiculopathy, had ESI's, has resolved.  Pt Diabetic: No Pt smoker: non-smoker He denies any ETOH consumption   Past Medical History:  Diagnosis Date  . AAA (abdominal aortic aneurysm) ([place])   . Diverticulitis    3x through 18 months around 2012  . H/O hypercholesterolemia   . History of alcohol abuse    sober since age 58  . History of vertigo    december 2007   . Seasonal  allergies    no rx   Past Surgical History:  Procedure Laterality Date  . BACK SURGERY  1984   ruptured disc  . bilateral cataract     L pupil larger after lens implant  . CARDIOVASCULAR STRESS TEST  Jan. 14,2014  . HERNIA REPAIR  2005  . SKIN CANCER EXCISION  09/05/2014   BCC-  Nose. 9 total    Social History Social History   Socioeconomic History  . Marital status: Married    Spouse name: Not on file  . Number of children: Not on file  . Years of education: Not on file  . Highest education level: Not on file  Social Needs  . Financial resource strain: Not on file  . Food insecurity - worry: Not on file  . Food insecurity - inability: Not on file  . Transportation needs - medical: Not on file  . Transportation needs - non-medical: Not on file  Occupational History  . Not on file  Tobacco Use  . Smoking status: Never Smoker  . Smokeless tobacco: Never Used  Substance and Sexual Activity  . Alcohol use: No  . Drug use: No  . Sexual activity: Not on file  Other Topics Concern  . Not on file  Social History Narrative   Married to wife Charles Stephenson (patient of mine). 2 kids. No grandkids. No pets.       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 AAA (abdominal aortic aneurysm) Father        sounds like died of DIC- otherwise healthy  . Peripheral vascular disease Father        Aneurysm- Aorta  . AAA (abdominal aortic aneurysm) Brother        states multiple medicle issues but died of pulmonary fibrosis- was told smoking related  . Heart disease Brother 12       Before age 35  . Heart attack Brother        x2  . Alcohol abuse Brother        sober 25 years when he died  . Prostate cancer Brother   . Cancer Mother        Stomach    Current Outpatient Medications  on File Prior to Visit  Medication Sig Dispense Refill  . atorvastatin (LIPITOR) 20 MG tablet Take 1 tablet (20 mg total) by mouth daily at 6 PM. 91 tablet 3  . Cholecalciferol (VITAMIN D3) 2000 units capsule Take 2,000 Units by mouth daily.    . Multiple Vitamins-Minerals (CENTRUM SILVER PO) Take 1 tablet by mouth daily.     . psyllium (METAMUCIL SMOOTH TEXTURE) 28 % packet Take 1 packet by mouth daily.    . ciprofloxacin (CIPRO) 500 MG tablet Take 1 tablet (500 mg total) by mouth 2 (two) times daily. (Patient not taking: Reported on 03/19/2017) 10 tablet 0  . polyethylene glycol powder (GLYCOLAX/MIRALAX) powder Take 17 g by mouth daily. (Patient not taking: Reported on 03/19/2017) 3350 g 1   No current facility-administered medications on file prior to visit.    No Known Allergies  ROS: See HPI for pertinent positives and negatives.  Physical Examination  Vitals:   03/19/17 1448  BP: 130/75  Pulse: (!) 54  Resp: 16  Temp: (!) 97 F (36.1 C)  TempSrc: Oral  SpO2: 97%  Weight: 157 lb (71.2 kg)  Height: 5\' 11"  (1.803 m)   Body mass index is 21.9 kg/m.  General: A&O x 3, WD, fit appearing.  Eyes: Left pupil is larger than right. HENT: No gross abnormalities.  Pulmonary: Sym exp, respirations are non labored, good air movt, CTAB, no rales, rhonchi, or wheezing.  Cardiac: Regular rhythm, bradycardic rate (not taking a beta blocker), no murmur detected.   Carotid Bruits Left Right   positive Negative    Aorta is palpable  Radial pulses are 3+ palpable and =   VASCULAR EXAM: LE Pulses  LEFT  RIGHT   FEMORAL  3+palpable 3+palpable   POPLITEAL  2+ palpable  3+palpable  POSTERIOR TIBIAL  1+palpable  1+palpable   DORSALIS PEDIS ANTERIOR TIBIAL  2+palpable  2+palpable    Gastrointestinal: soft, NTND, -G/R, - HSM, - palpable masses, - CVAT B.  Musculoskeletal: M/S 5/5 throughout, Extremities  without ischemic changes.  Neurologic: CN 2-12 intact, Pain and light touch intact in extremities, Motor exam as listed above. Skin: No rash, no cellulitis, no ulcers.  Psychiatric: Normal thought content, mood appropriate to clinical situation.    DATA  AAA Duplex (03-18-17):  Previous size: 4.3 cm (Date: 06/05/16), Right CIA: 1.3 cm, Left CIA: 1.5 cm   Current size:  5.1 cm (Date: 03-18-17); Right CIA: 1.4 cm; Left CIA: 1.1 cm   Carotid Duplex (06-05-16): <40% stenosis of the bilateral  ICA. Bilateral vertebral artery flow is antegrade.  Bilateral subclavian artery waveforms are normal.  No prior exam for comparison.   Medical Decision Making  The patient is a 83 y.o. male who presents with asymptomatic AAA with an increase in size to 5.1 cm today, from 4.3 cm on 06-05-16. His risk factors for AAA are two first degree relatives with AAA, male, caucasian, and age 31, although he appear fit and younger.    Based on this patient's exam and diagnostic studies, the patient will be scheduled for CTA abd/pelvis, pt would like this to be done ASAP, see Dr. Donnetta Hutching afterward at Dr. Donnetta Hutching soonest availability.   Consideration for repair of AAA would be made when the size approaches 5.0 cm, growth > 1 cm/yr, and symptomatic status.        The patient was given information about AAA including signs, symptoms, treatment, and how to minimize the risk of enlargement and rupture of aneurysms.    I emphasized the importance of maximal medical management including strict control of blood pressure, blood glucose, and lipid levels, antiplatelet agents, obtaining regular exercise, and continued  cessation of smoking.   The patient was advised to call 911 should the patient experience sudden onset abdominal or back pain.   Thank you for allowing [place] to participate in this patient's care.  Clemon Chambers, RN, MSN, FNP-C [facility] Office: [phone]  Clinic Physician:  Oneida Alar  03/19/2017, 3:03 PM

## 2017-03-19 NOTE — Patient Instructions (Signed)
Abdominal Aortic Aneurysm Blood pumps away from the heart through tubes (blood vessels) called arteries. Aneurysms are weak or damaged places in the wall of an artery. It bulges out like a balloon. An abdominal aortic aneurysm happens in the main artery of the body (aorta). It can burst or tear, causing bleeding inside the body. This is an emergency. It needs treatment right away. What are the causes? The exact cause is unknown. Things that could cause this problem include:  Fat and other substances building up in the lining of a tube.  Swelling of the walls of a blood vessel.  Certain tissue diseases.  Belly (abdominal) trauma.  An infection in the main artery of the body.  What increases the risk? There are things that make it more likely for you to have an aneurysm. These include:  Being over the age of 81 years old.  Having high blood pressure (hypertension).  Being a male.  Being white.  Being very overweight (obese).  Having a family history of aneurysm.  Using tobacco products.  What are the signs or symptoms? Symptoms depend on the size of the aneurysm and how fast it grows. There may not be symptoms. If symptoms occur, they can include:  Pain (belly, side, lower back, or groin).  Feeling full after eating a small amount of food.  Feeling sick to your stomach (nauseous), throwing up (vomiting), or both.  Feeling a lump in your belly that feels like it is beating (pulsating).  Feeling like you will pass out (faint).  How is this treated?  Medicine to control blood pressure and pain.  Imaging tests to see if the aneurysm gets bigger.  Surgery. How is this prevented? To lessen your chance of getting this condition:  Stop smoking. Stop chewing tobacco.  Limit or avoid alcohol.  Keep your blood pressure, blood sugar, and cholesterol within normal limits.  Eat less salt.  Eat foods low in saturated fats and cholesterol. These are found in animal and  whole dairy products.  Eat more fiber. Fiber is found in whole grains, vegetables, and fruits.  Keep a healthy weight.  Stay active and exercise often.  This information is not intended to replace advice given to you by your health care provider. Make sure you discuss any questions you have with your health care provider. Document Released: 06/14/2012 Document Revised: 07/26/2015 Document Reviewed: 03/19/2012 Elsevier Interactive Patient Education  2017 [employer]  

## 2017-03-27 ENCOUNTER — Ambulatory Visit
Admission: RE | Admit: 2017-03-27 | Discharge: 2017-03-27 | Disposition: A | Discharge status: Home / Self Care | Payer: PPO | Source: Ambulatory Visit | Attending: Family | Admitting: Family

## 2017-03-27 DIAGNOSIS — I714 Abdominal aortic aneurysm, without rupture, unspecified: Secondary | ICD-10-CM

## 2017-03-27 MED ORDER — IOPAMIDOL (ISOVUE-370) INJECTION 76%
75.0000 mL | Freq: Once | INTRAVENOUS | Status: AC | PRN
  Administered 2017-03-27: 75 mL via INTRAVENOUS

## 2017-03-31 ENCOUNTER — Encounter: Payer: Self-pay | Admitting: Vascular Surgery

## 2017-03-31 ENCOUNTER — Ambulatory Visit (INDEPENDENT_AMBULATORY_CARE_PROVIDER_SITE_OTHER): Payer: PPO | Admitting: Vascular Surgery

## 2017-03-31 ENCOUNTER — Encounter: Payer: Self-pay | Admitting: *Deleted

## 2017-03-31 VITALS — BP 158/90 | HR 63 | Temp 97.6°F | Resp 16 | Ht 71.0 in | Wt 161.0 lb

## 2017-03-31 DIAGNOSIS — I714 Abdominal aortic aneurysm, without rupture, unspecified: Secondary | ICD-10-CM

## 2017-03-31 NOTE — H&P (View-Only) (Signed)
[facility]ist of [place]  Patient name: Charles Stephenson MRN: [id number] DOB: 18-Aug-1934 Sex: male  REASON FOR VISIT: Follow-up of infrarenal abdominal aortic aneurysm  HPI: Charles Stephenson is a 83 y.o. male here today for follow-up.  Had discovery of infrarenal abdominal aortic aneurysm in 2011.  Has had serial ultrasounds since that time.  Had been relatively stable in the low 4 cm range for several years.  On most recent evaluation this was found to have 1 cm growth in to the low 5 cm range.  He underwent CT scan for further evaluation on 03/27/2017 and is here today for discussion of this.  He remains in excellent health of his age of 83.  No cardiac disease.  He is very active.  Of note his father died after elective repair of an aneurysm 27 years ago.  His brother had elective aneurysm repair several years later suffered an infected aortic graft and had removal of the graft with what sounds like extra-anatomic bypass and lives several beers and then died of pulmonary fibrosis.  He obviously is extremely concerned regarding his diagnosis.  Does report having a normal cardiac stress test several years ago and no difficulty with cardiac status.  Past Medical History:  Diagnosis Date  . AAA (abdominal aortic aneurysm) ([place])   . Diverticulitis    3x through 18 months around 2012  . H/O hypercholesterolemia   . History of alcohol abuse    sober since age 83  . History of vertigo    december 2007   . Seasonal allergies    no rx    Family History  Problem Relation Age of Onset  . AAA (abdominal aortic aneurysm) Father        sounds like died of DIC- otherwise healthy  . Peripheral vascular disease Father        Aneurysm- Aorta  . AAA (abdominal aortic aneurysm) Brother        states multiple medicle issues but died of pulmonary fibrosis- was told smoking related  . Heart disease Brother 49       Before age 3  . Heart  attack Brother        x2  . Alcohol abuse Brother        sober 25 years when he died  . Prostate cancer Brother   . Cancer Mother        Stomach    SOCIAL HISTORY: Social History   Tobacco Use  . Smoking status: Never Smoker  . Smokeless tobacco: Never Used  Substance Use Topics  . Alcohol use: No    No Known Allergies  Current Outpatient Medications  Medication Sig Dispense Refill  . atorvastatin (LIPITOR) 20 MG tablet Take 1 tablet (20 mg total) by mouth daily at 6 PM. 91 tablet 3  . Cholecalciferol (VITAMIN D3) 2000 units capsule Take 2,000 Units by mouth daily.    . Multiple Vitamins-Minerals (CENTRUM SILVER PO) Take 1 tablet by mouth daily.     . psyllium (METAMUCIL SMOOTH TEXTURE) 28 % packet Take 1 packet by mouth daily.     No current facility-administered medications for this visit.     REVIEW OF SYSTEMS:  [X]  denotes positive finding, [ ]  denotes negative finding Cardiac  Comments:  Chest pain or chest pressure:    Shortness of breath upon exertion:    Short of breath when lying flat:    Irregular heart rhythm:  Vascular    Pain in calf, thigh, or hip brought on by ambulation:    Pain in feet at night that wakes you up from your sleep:     Blood clot in your veins:    Leg swelling:           PHYSICAL EXAM: Vitals:   03/31/17 1208  BP: (!) 158/90  Pulse: 63  Resp: 16  Temp: 97.6 F (36.4 C)  TempSrc: Oral  SpO2: 98%  Weight: 161 lb (73 kg)  Height: 5\' 11"  (1.803 m)    GENERAL: The patient is a well-nourished male, in no acute distress. The vital signs are documented above. CARDIOVASCULAR: Carotid arteries without bruits bilaterally.  Heart regular rate and rhythm.  Abdomen soft with easily palpable nontender abdominal aortic aneurysm.  2-3+ femoral popliteal and dorsalis pedis pulses. PULMONARY: There is good air exchange  MUSCULOSKELETAL: There are no major deformities or cyanosis. NEUROLOGIC: No focal weakness or paresthesias are  detected. SKIN: There are no ulcers or rashes noted. PSYCHIATRIC: The patient has a normal affect.  DATA:  Reviewed the CT scan with the patient.  This does show a 5.3 cm infrarenal abdominal aortic aneurysm.  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  MEDICAL ISSUES: It is concerning that he has had a centimeter growth in approximately a year after stable follow-up.  He is quite anxious to proceed with elective repair in light of his prior family history.  I feel this is appropriate with his more rapid than expansive growth and a mid 5 cm aneurysm at 5.3 cm.  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  Explained that he would require coiling of his left hypogastric artery and explain the possibility of colonic ischemia but explained that this should be low since his right hypogastric would remain patent.  We will have him see cardiology for clearance and then plan elective repair    Rosetta Posner, MD [facility]ists of [facility] - Closed Tel [phone] Pager [phone]

## 2017-03-31 NOTE — Progress Notes (Signed)
[facility]in Specialist of [place]  Patient name: Charles Stephenson MRN: [id number] DOB: 10-05-1934 Sex: male  REASON FOR VISIT: Follow-up of infrarenal abdominal aortic aneurysm  HPI: Charles Stephenson is a 83 y.o. male here today for follow-up.  Had discovery of infrarenal abdominal aortic aneurysm in 2011.  Has had serial ultrasounds since that time.  Had been relatively stable in the low 4 cm range for several years.  On most recent evaluation this was found to have 1 cm growth in to the low 5 cm range.  He underwent CT scan for further evaluation on 03/27/2017 and is here today for discussion of this.  He remains in excellent health of his age of 51.  No cardiac disease.  He is very active.  Of note his father died after elective repair of an aneurysm 27 years ago.  His brother had elective aneurysm repair several years later suffered an infected aortic graft and had removal of the graft with what sounds like extra-anatomic bypass and lives several beers and then died of pulmonary fibrosis.  He obviously is extremely concerned regarding his diagnosis.  Does report having a normal cardiac stress test several years ago and no difficulty with cardiac status.  Past Medical History:  Diagnosis Date  . AAA (abdominal aortic aneurysm) ([place])   . Diverticulitis    3x through 18 months around 2012  . H/O hypercholesterolemia   . History of alcohol abuse    sober since age 63  . History of vertigo    december 2007   . Seasonal allergies    no rx    Family History  Problem Relation Age of Onset  . AAA (abdominal aortic aneurysm) Father        sounds like died of DIC- otherwise healthy  . Peripheral vascular disease Father        Aneurysm- Aorta  . AAA (abdominal aortic aneurysm) Brother        states multiple medicle issues but died of pulmonary fibrosis- was told smoking related  . Heart disease Brother 31       Before age 5  . Heart  attack Brother        x2  . Alcohol abuse Brother        sober 25 years when he died  . Prostate cancer Brother   . Cancer Mother        Stomach    SOCIAL HISTORY: Social History   Tobacco Use  . Smoking status: Never Smoker  . Smokeless tobacco: Never Used  Substance Use Topics  . Alcohol use: No    No Known Allergies  Current Outpatient Medications  Medication Sig Dispense Refill  . atorvastatin (LIPITOR) 20 MG tablet Take 1 tablet (20 mg total) by mouth daily at 6 PM. 91 tablet 3  . Cholecalciferol (VITAMIN D3) 2000 units capsule Take 2,000 Units by mouth daily.    . Multiple Vitamins-Minerals (CENTRUM SILVER PO) Take 1 tablet by mouth daily.     . psyllium (METAMUCIL SMOOTH TEXTURE) 28 % packet Take 1 packet by mouth daily.     No current facility-administered medications for this visit.     REVIEW OF SYSTEMS:  [X]  denotes positive finding, [ ]  denotes negative finding Cardiac  Comments:  Chest pain or chest pressure:    Shortness of breath upon exertion:    Short of breath when lying flat:    Irregular heart rhythm:  Vascular    Pain in calf, thigh, or hip brought on by ambulation:    Pain in feet at night that wakes you up from your sleep:     Blood clot in your veins:    Leg swelling:           PHYSICAL EXAM: Vitals:   03/31/17 1208  BP: (!) 158/90  Pulse: 63  Resp: 16  Temp: 97.6 F (36.4 C)  TempSrc: Oral  SpO2: 98%  Weight: 161 lb (73 kg)  Height: 5\' 11"  (1.803 m)    GENERAL: The patient is a well-nourished male, in no acute distress. The vital signs are documented above. CARDIOVASCULAR: Carotid arteries without bruits bilaterally.  Heart regular rate and rhythm.  Abdomen soft with easily palpable nontender abdominal aortic aneurysm.  2-3+ femoral popliteal and dorsalis pedis pulses. PULMONARY: There is good air exchange  MUSCULOSKELETAL: There are no major deformities or cyanosis. NEUROLOGIC: No focal weakness or paresthesias are  detected. SKIN: There are no ulcers or rashes noted. PSYCHIATRIC: The patient has a normal affect.  DATA:  Reviewed the CT scan with the patient.  This does show a 5.3 cm infrarenal abdominal aortic aneurysm.  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  MEDICAL ISSUES: It is concerning that he has had a centimeter growth in approximately a year after stable follow-up.  He is quite anxious to proceed with elective repair in light of his prior family history.  I feel this is appropriate with his more rapid than expansive growth and a mid 5 cm aneurysm at 5.3 cm.  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  Explained that he would require coiling of his left hypogastric artery and explain the possibility of colonic ischemia but explained that this should be low since his right hypogastric would remain patent.  We will have him see cardiology for clearance and then plan elective repair    Rosetta Posner, MD [facility] Vascular and Vein Specialists of [facility] Tel [phone] Pager [phone]

## 2017-04-01 ENCOUNTER — Encounter: Payer: Self-pay | Admitting: Family Medicine

## 2017-04-01 ENCOUNTER — Other Ambulatory Visit: Payer: Self-pay | Admitting: *Deleted

## 2017-04-01 ENCOUNTER — Ambulatory Visit: Payer: PPO | Admitting: Family Medicine

## 2017-04-01 ENCOUNTER — Encounter: Payer: Self-pay | Admitting: Vascular Surgery

## 2017-04-01 VITALS — BP 160/88 | HR 71 | Temp 98.6°F | Wt 161.8 lb

## 2017-04-01 DIAGNOSIS — J0140 Acute pansinusitis, unspecified: Secondary | ICD-10-CM

## 2017-04-01 DIAGNOSIS — J40 Bronchitis, not specified as acute or chronic: Secondary | ICD-10-CM | POA: Diagnosis not present

## 2017-04-01 MED ORDER — AZITHROMYCIN 250 MG PO TABS: ORAL_TABLET | ORAL | 0 refills | Status: DC

## 2017-04-01 NOTE — Progress Notes (Signed)
   Shloimy Michalski is a 83 y.o. male here for an acute visit.  History of Present Illness:   Lonell Grandchild, CMA acting as scribe for Dr. Briscoe Deutscher.   Cough  This is a new problem. The current episode started in the past 7 days. The problem has been gradually worsening. The problem occurs every few hours. The cough is productive of sputum. Associated symptoms include rhinorrhea and a sore throat.  Patient has surgery scheduled for 04/13/17 for abdominal aortic aneurism wanted to get treatment so he does not have to put surgery off.   PMHx, SurgHx, SocialHx, Medications, and Allergies were reviewed in the Visit Navigator and updated as appropriate.  Current Medications:   Current Outpatient Medications:  .  atorvastatin (LIPITOR) 20 MG tablet, Take 1 tablet (20 mg total) by mouth daily at 6 PM., Disp: 91 tablet, Rfl: 3 .  Cholecalciferol (VITAMIN D3) 2000 units capsule, Take 4,000 Units by mouth daily. , Disp: , Rfl:  .  Multiple Vitamins-Minerals (CENTRUM SILVER PO), Take 1 tablet by mouth daily. , Disp: , Rfl:  .  PSYLLIUM FIBER PO, Take 6-8 tablets by mouth at bedtime., Disp: , Rfl:    No Known Allergies   Review of Systems:   Pertinent items are noted in the HPI. Otherwise, ROS is negative.  Vitals:   Vitals:   04/01/17 1411  BP: (!) 160/88  Pulse: 71  Temp: 98.6 F (37 C)  TempSrc: Oral  SpO2: 96%  Weight: 161 lb 12.8 oz (73.4 kg)     Body mass index is 22.57 kg/m.  Physical Exam:   Physical Exam  Constitutional: He is oriented to person, place, and time. He appears well-developed and well-nourished. No distress.  HENT:  Head: Normocephalic and atraumatic.  Right Ear: External ear normal.  Left Ear: External ear normal.  Nose: Right sinus exhibits maxillary sinus tenderness. Left sinus exhibits maxillary sinus tenderness.  Mouth/Throat: Oropharynx is clear and moist.  Eyes: Conjunctivae and EOM are normal. Pupils are equal, round, and reactive to  light.  Neck: Normal range of motion. Neck supple.  Cardiovascular: Normal rate, regular rhythm, normal heart sounds and intact distal pulses.  Pulmonary/Chest: Effort normal and breath sounds normal.  Abdominal: Soft. Bowel sounds are normal.  Musculoskeletal: Normal range of motion.  Neurological: He is alert and oriented to person, place, and time.  Skin: Skin is warm and dry.  Psychiatric: He has a normal mood and affect. His behavior is normal. Judgment and thought content normal.  Nursing note and vitals reviewed.   Assessment and Plan:   1. Subacute pansinusitis 2. Bronchitis - azithromycin (ZITHROMAX) 250 MG tablet; 2 po on day 1, then one po daily until gone  Dispense: 6 tablet; Refill: 0  . Reviewed expectations re: course of current medical issues. . Discussed self-management of symptoms. . Outlined signs and symptoms indicating need for more acute intervention. . Patient verbalized understanding and all questions were answered. Marland Kitchen Health Maintenance issues including appropriate healthy diet, exercise, and smoking avoidance were discussed with patient. . See orders for this visit as documented in the electronic medical record. . Patient received an After Visit Summary.  CMA served as Education administrator during this visit. History, Physical, and Plan performed by medical provider. The above documentation has been reviewed and is accurate and complete. Briscoe Deutscher, D.O.   Briscoe Deutscher, DO [place] 04/02/2017

## 2017-04-02 ENCOUNTER — Other Ambulatory Visit: Payer: Self-pay | Admitting: *Deleted

## 2017-04-02 ENCOUNTER — Ambulatory Visit: Payer: PPO | Admitting: *Deleted

## 2017-04-07 ENCOUNTER — Ambulatory Visit: Payer: PPO | Admitting: Cardiovascular Disease

## 2017-04-07 ENCOUNTER — Encounter: Payer: Self-pay | Admitting: Cardiovascular Disease

## 2017-04-07 VITALS — BP 138/72 | HR 53 | Ht 71.0 in | Wt 156.0 lb

## 2017-04-07 DIAGNOSIS — Z01818 Encounter for other preprocedural examination: Secondary | ICD-10-CM

## 2017-04-07 DIAGNOSIS — E785 Hyperlipidemia, unspecified: Secondary | ICD-10-CM

## 2017-04-07 DIAGNOSIS — I714 Abdominal aortic aneurysm, without rupture, unspecified: Secondary | ICD-10-CM

## 2017-04-07 DIAGNOSIS — Z01810 Encounter for preprocedural cardiovascular examination: Secondary | ICD-10-CM

## 2017-04-07 NOTE — Progress Notes (Signed)
Cardiology Office Note   Date:  04/07/2017   ID:  Charles Stephenson, DOB [birth year], MRN [id number]  PCP:  Marin Olp, MD  Cardiologist:   Kathlyn Sacramento, MD   Chief Complaint  Patient presents with  . Follow-up      History of Present Illness: Charles Stephenson is a 83 y.o. male who was referred by Dr. early for preoperative cardiovascular evaluation before endovascular abdominal aortic aneurysm repair which is scheduled for next week. He has known history of abdominal aortic aneurysm which has been stable and followed by [facility]. He also has hyperlipidemia but otherwise he has been healthy with no other chronic medical conditions. He is not a smoker.  His brother had myocardial infarction twice but he had a poor lifestyle overall.  There is strong family history of abdominal aortic aneurysm. The patient had a nuclear stress test done twice in 2014 and most recently in 2017.  Both were overall unremarkable and low risk. The patient was found to have enlarging abdominal aortic aneurysm.  He continues to be very active and walks 2-1/2 miles every day exercise sometimes with incline.  He also plays golf on a regular basis.  He was able to to shovel snow a few months ago with no limitations.  He has no chest pain, dizziness or palpitations.  He reports only mild exertional dyspnea with overexertion.   Past Medical History:  Diagnosis Date  . AAA (abdominal aortic aneurysm) ([place])   . Diverticulitis    3x through 18 months around 2012  . H/O hypercholesterolemia   . History of alcohol abuse    sober since age 43  . History of vertigo    december 2007   . Seasonal allergies    no rx    Past Surgical History:  Procedure Laterality Date  . BACK SURGERY  1984   ruptured disc  . bilateral cataract     L pupil larger after lens implant  . CARDIOVASCULAR STRESS TEST  Jan. 14,2014  . HERNIA REPAIR  2005  . SKIN CANCER EXCISION  09/05/2014   [facility]-  Nose. 9 total       Current Outpatient Medications  Medication Sig Dispense Refill  . atorvastatin (LIPITOR) 20 MG tablet Take 1 tablet (20 mg total) by mouth daily at 6 PM. 91 tablet 3  . Cholecalciferol (VITAMIN D3) 2000 units capsule Take 4,000 Units by mouth daily.     . Multiple Vitamins-Minerals (CENTRUM SILVER PO) Take 1 tablet by mouth daily.     . PSYLLIUM FIBER PO Take 6-8 tablets by mouth at bedtime.     No current facility-administered medications for this visit.     Allergies:   Patient has no known allergies.    Social History:  The patient  reports that  has never smoked. he has never used smokeless tobacco. He reports that he does not drink alcohol or use drugs.   Family History:  The patient's family history includes AAA (abdominal aortic aneurysm) in his brother and father; Alcohol abuse in his brother; Cancer in his mother; Heart attack in his brother; Heart disease (age of onset: 40) in his brother; Peripheral vascular disease in his father; Prostate cancer in his brother.    ROS:  Please see the history of present illness.   Otherwise, review of systems are positive for none.   All other systems are reviewed and negative.    PHYSICAL EXAM: VS:  BP 138/72   Pulse Marland Kitchen)  53   Ht 5\' 11"  (1.803 m)   Wt 156 lb (70.8 kg)   BMI 21.76 kg/m  , BMI Body mass index is 21.76 kg/m. GEN: Well nourished, well developed, in no acute distress  HEENT: normal  Neck: no JVD, carotid bruits, or masses Cardiac: RRR; no murmurs, rubs, or gallops,no edema  Respiratory:  clear to auscultation bilaterally, normal work of breathing GI: soft, nontender, nondistended, + BS MS: no deformity or atrophy  Skin: warm and dry, no rash Neuro:  Strength and sensation are intact Psych: euthymic mood, full affect   EKG:  EKG is ordered today. The ekg ordered today demonstrates sinus bradycardia with no evidence of prior infarcts.  No significant ST or T wave changes.   Recent Labs: 05/29/2016: ALT 18;  BUN 15; Creatinine, Ser 0.88; Hemoglobin 14.8; Platelets 166.0; Potassium 4.5; Sodium 140    Lipid Panel    Component Value Date/Time   CHOL 150 05/29/2016 0853   TRIG 47.0 05/29/2016 0853   HDL 63.30 05/29/2016 0853   CHOLHDL 2 05/29/2016 0853   VLDL 9.4 05/29/2016 0853   LDLCALC 77 05/29/2016 0853      Wt Readings from Last 3 Encounters:  04/07/17 156 lb (70.8 kg)  04/01/17 161 lb 12.8 oz (73.4 kg)  03/31/17 161 lb (73 kg)       No flowsheet data found.    ASSESSMENT AND PLAN:  1.  Preop cardiovascular evaluation for endovascular repair of abdominal aortic aneurysm: The patient had stress testing in the past twice most recently in 2017 and both were unremarkable.  He has excellent functional capacity for age and has no physical limitations.  He exercises regularly with no symptoms.  His cardiac exam does not reveal any heart murmurs and his EKG shows no signs of ischemia. Given all of the above, the patient can proceed with an overall low risk from a cardiac standpoint.  No indication for repeat stress testing.  2.  Abdominal aortic aneurysm: He is scheduled for elective endovascular repair next week.  Consider starting low-dose aspirin post surgery.  3.  Hyperlipidemia: Currently on atorvastatin 20 mg daily.    Disposition:   FU with me as needed.   Signed,  Kathlyn Sacramento, MD  04/07/2017 10:53 AM    [place]

## 2017-04-07 NOTE — Patient Instructions (Signed)
Medication Instructions: Your physician recommends that you continue on your current medications as directed. Please refer to the Current Medication list given to you today.  If you need a refill on your cardiac medications before your next appointment, please call your pharmacy.   Follow-Up: Your physician wants you to follow-up in as needed    Thank you for choosing [facility]!!

## 2017-04-08 ENCOUNTER — Encounter (HOSPITAL_COMMUNITY)
Admission: RE | Admit: 2017-04-08 | Discharge: 2017-04-08 | Disposition: A | Discharge status: Home / Self Care | Payer: PPO | Source: Ambulatory Visit | Attending: Vascular Surgery | Admitting: Vascular Surgery

## 2017-04-08 ENCOUNTER — Encounter (HOSPITAL_COMMUNITY): Payer: Self-pay

## 2017-04-08 ENCOUNTER — Other Ambulatory Visit (HOSPITAL_COMMUNITY): Payer: Self-pay | Admitting: *Deleted

## 2017-04-08 ENCOUNTER — Other Ambulatory Visit: Payer: Self-pay

## 2017-04-08 DIAGNOSIS — Z8042 Family history of malignant neoplasm of prostate: Secondary | ICD-10-CM | POA: Diagnosis not present

## 2017-04-08 DIAGNOSIS — Z800 Family history of malignant neoplasm of digestive organs: Secondary | ICD-10-CM | POA: Diagnosis not present

## 2017-04-08 DIAGNOSIS — F1021 Alcohol dependence, in remission: Secondary | ICD-10-CM | POA: Insufficient documentation

## 2017-04-08 DIAGNOSIS — Z01812 Encounter for preprocedural laboratory examination: Secondary | ICD-10-CM | POA: Diagnosis not present

## 2017-04-08 DIAGNOSIS — Z811 Family history of alcohol abuse and dependence: Secondary | ICD-10-CM | POA: Insufficient documentation

## 2017-04-08 DIAGNOSIS — Z8249 Family history of ischemic heart disease and other diseases of the circulatory system: Secondary | ICD-10-CM | POA: Insufficient documentation

## 2017-04-08 DIAGNOSIS — I714 Abdominal aortic aneurysm, without rupture: Secondary | ICD-10-CM | POA: Diagnosis not present

## 2017-04-08 DIAGNOSIS — Z79899 Other long term (current) drug therapy: Secondary | ICD-10-CM | POA: Diagnosis not present

## 2017-04-08 DIAGNOSIS — K5790 Diverticulosis of intestine, part unspecified, without perforation or abscess without bleeding: Secondary | ICD-10-CM | POA: Insufficient documentation

## 2017-04-08 LAB — COMPREHENSIVE METABOLIC PANEL
ALT: 17 U/L (ref 17–63)
AST: 24 U/L (ref 15–41)
Albumin: 3.7 g/dL (ref 3.5–5.0)
Alkaline Phosphatase: 53 U/L (ref 38–126)
Anion gap: 12 (ref 5–15)
BUN: 14 mg/dL (ref 6–20)
CO2: 20 mmol/L — ABNORMAL LOW (ref 22–32)
Calcium: 9.1 mg/dL (ref 8.9–10.3)
Chloride: 104 mmol/L (ref 101–111)
Creatinine, Ser: 0.79 mg/dL (ref 0.61–1.24)
GFR calc Af Amer: >60 mL/min (ref >60)
GFR calc non Af Amer: >60 mL/min (ref >60)
Glucose, Bld: 115 mg/dL — ABNORMAL HIGH (ref 65–99)
Potassium: 4.5 mmol/L (ref 3.5–5.1)
Sodium: 136 mmol/L (ref 135–145)
Total Bilirubin: 0.9 mg/dL (ref 0.3–1.2)
Total Protein: 6.4 g/dL — ABNORMAL LOW (ref 6.5–8.1)

## 2017-04-08 LAB — URINALYSIS, ROUTINE W REFLEX MICROSCOPIC
Bilirubin Urine: NEGATIVE
Glucose, UA: NEGATIVE mg/dL
Hgb urine dipstick: NEGATIVE
Ketones, ur: NEGATIVE mg/dL
Leukocytes, UA: NEGATIVE
Nitrite: NEGATIVE
Protein, ur: NEGATIVE mg/dL
Specific Gravity, Urine: 1.004 — ABNORMAL LOW (ref 1.005–1.030)
pH: 7 (ref 5.0–8.0)

## 2017-04-08 LAB — SURGICAL PCR SCREEN
MRSA, PCR: NEGATIVE
Staphylococcus aureus: NEGATIVE

## 2017-04-08 LAB — TYPE AND SCREEN
ABO/RH(D): O POS
Antibody Screen: NEGATIVE

## 2017-04-08 LAB — CBC
HCT: 41 % (ref 39.0–52.0)
Hemoglobin: 13.7 g/dL (ref 13.0–17.0)
MCH: 30.5 pg (ref 26.0–34.0)
MCHC: 33.4 g/dL (ref 30.0–36.0)
MCV: 91.3 fL (ref 78.0–100.0)
Platelets: 175 10*3/uL (ref 150–400)
RBC: 4.49 MIL/uL (ref 4.22–5.81)
RDW: 13.1 % (ref 11.5–15.5)
WBC: 5.7 10*3/uL (ref 4.0–10.5)

## 2017-04-08 LAB — APTT: aPTT: 29 seconds (ref 24–36)

## 2017-04-08 LAB — PROTIME-INR
INR: 1.1
Prothrombin Time: 14.1 seconds (ref 11.4–15.2)

## 2017-04-08 LAB — ABO/RH: ABO/RH(D): O POS

## 2017-04-08 NOTE — Pre-Procedure Instructions (Signed)
Charles Stephenson  04/08/2017    Your procedure is scheduled on Monday, April 13, 2017 at 7:30 AM.   Report to [place] Entrance "A" Admitting Office at 5:30 AM.   Call this number if you have problems the morning of surgery: [phone]   Questions prior to day of surgery, please call [phone] between 8 & 4 PM.   Remember:  Do not eat food or drink liquids after midnight Sunday, 04/12/17.  Do not take any medications morning of surgery.  Stop Multivitamins as of today. Do not use NSAIDS (Ibuprofen, Aleve, etc.) prior to surgery.   Do not wear jewelry.  Do not wear lotions, powders, cologne or deodorant.  Men may shave face and neck.  Do not bring valuables to the hospital.  [facility] is not responsible for any belongings or valuables.  Contacts, dentures or bridgework may not be worn into surgery.  Leave your suitcase in the car.  After surgery it may be brought to your room.  For patients admitted to the hospital, discharge time will be determined by your treatment team.  [facility] - Preparing for Surgery  Before surgery, you can play an important role.  Because skin is not sterile, your skin needs to be as free of germs as possible.  You can reduce the number of germs on you skin by washing with CHG (chlorahexidine gluconate) soap before surgery.  CHG is an antiseptic cleaner which kills germs and bonds with the skin to continue killing germs even after washing.  Please DO NOT use if you have an allergy to CHG or antibacterial soaps.  If your skin becomes reddened/irritated stop using the CHG and inform your nurse when you arrive at Short Stay.  Do not shave (including legs and underarms) for at least 48 hours prior to the first CHG shower.  You may shave your face.  Please follow these instructions carefully:   1.  Shower with CHG Soap the night before surgery and the                    morning of Surgery.  2.  If you choose to wash your hair, wash  your hair first as usual with your       normal shampoo.  3.  After you shampoo, rinse your hair and body thoroughly to remove the shampoo.  4.  Use CHG as you would any other liquid soap.  You can apply chg directly       to the skin and wash gently with scrungie or a clean washcloth.  5.  Apply the CHG Soap to your body ONLY FROM THE NECK DOWN.        Do not use on open wounds or open sores.  Avoid contact with your eyes, ears, mouth and genitals (private parts).  Wash genitals (private parts) with your normal soap.  6.  Wash thoroughly, paying special attention to the area where your surgery        will be performed.  7.  Thoroughly rinse your body with warm water from the neck down.  8.  DO NOT shower/wash with your normal soap after using and rinsing off       the CHG Soap.  9.  Pat yourself dry with a clean towel.            10.  Wear clean pajamas.            11 .  Place clean  sheets on your bed the night of your first shower and do not        sleep with pets.  Day of Surgery  Shower as above. Do not apply any lotions/deodorants the morning of surgery.  Please wear clean clothes to the hospital.   Please read over the fact sheets that you were given.

## 2017-04-10 LAB — BLOOD GAS, ARTERIAL
Acid-Base Excess: 2.5 mmol/L — ABNORMAL HIGH (ref 0.0–2.0)
Bicarbonate: 26.3 mmol/L (ref 20.0–28.0)
Drawn by: 470591
FIO2: 21
Patient temperature: 98.6
pCO2 arterial: 39 mmHg (ref 32.0–48.0)
pH, Arterial: 7.444 (ref 7.350–7.450)
pO2, Arterial: 72.4 mmHg — ABNORMAL LOW (ref 83.0–108.0)

## 2017-04-13 ENCOUNTER — Inpatient Hospital Stay (HOSPITAL_COMMUNITY)
Admission: RE | Admit: 2017-04-13 | Discharge: 2017-04-14 | DRG: 269 | Disposition: A | Discharge status: Home / Self Care | Payer: PPO | Source: Ambulatory Visit | Attending: Vascular Surgery | Admitting: Vascular Surgery

## 2017-04-13 ENCOUNTER — Inpatient Hospital Stay (HOSPITAL_COMMUNITY): Payer: PPO | Admitting: Certified Registered"

## 2017-04-13 ENCOUNTER — Encounter (HOSPITAL_COMMUNITY)
Admission: RE | Disposition: A | Discharge status: Home / Self Care | Payer: Self-pay | Source: Ambulatory Visit | Attending: Vascular Surgery

## 2017-04-13 ENCOUNTER — Inpatient Hospital Stay (HOSPITAL_COMMUNITY): Payer: PPO

## 2017-04-13 ENCOUNTER — Encounter (HOSPITAL_COMMUNITY): Payer: Self-pay

## 2017-04-13 ENCOUNTER — Other Ambulatory Visit: Payer: Self-pay

## 2017-04-13 DIAGNOSIS — Z79899 Other long term (current) drug therapy: Secondary | ICD-10-CM | POA: Diagnosis not present

## 2017-04-13 DIAGNOSIS — Z8679 Personal history of other diseases of the circulatory system: Secondary | ICD-10-CM | POA: Diagnosis not present

## 2017-04-13 DIAGNOSIS — E7800 Pure hypercholesterolemia, unspecified: Secondary | ICD-10-CM | POA: Diagnosis present

## 2017-04-13 DIAGNOSIS — I714 Abdominal aortic aneurysm, without rupture, unspecified: Secondary | ICD-10-CM | POA: Diagnosis present

## 2017-04-13 DIAGNOSIS — Z95828 Presence of other vascular implants and grafts: Secondary | ICD-10-CM

## 2017-04-13 DIAGNOSIS — Z85828 Personal history of other malignant neoplasm of skin: Secondary | ICD-10-CM

## 2017-04-13 DIAGNOSIS — I723 Aneurysm of iliac artery: Secondary | ICD-10-CM | POA: Diagnosis not present

## 2017-04-13 DIAGNOSIS — E785 Hyperlipidemia, unspecified: Secondary | ICD-10-CM | POA: Diagnosis not present

## 2017-04-13 DIAGNOSIS — I739 Peripheral vascular disease, unspecified: Secondary | ICD-10-CM | POA: Diagnosis not present

## 2017-04-13 HISTORY — PX: EMBOLIZATION: SHX5507

## 2017-04-13 HISTORY — DX: Pure hypercholesterolemia, unspecified: E78.00

## 2017-04-13 HISTORY — DX: Basal cell carcinoma of skin, unspecified: C44.91

## 2017-04-13 HISTORY — DX: Inflammatory liver disease, unspecified: K75.9

## 2017-04-13 HISTORY — PX: ABDOMINAL AORTIC ENDOVASCULAR STENT GRAFT: SHX5707

## 2017-04-13 LAB — BASIC METABOLIC PANEL
Anion gap: 9 (ref 5–15)
BUN: 14 mg/dL (ref 6–20)
CO2: 25 mmol/L (ref 22–32)
Calcium: 9 mg/dL (ref 8.9–10.3)
Chloride: 106 mmol/L (ref 101–111)
Creatinine, Ser: 0.93 mg/dL (ref 0.61–1.24)
GFR calc Af Amer: >60 mL/min (ref >60)
GFR calc non Af Amer: >60 mL/min (ref >60)
Glucose, Bld: 122 mg/dL — ABNORMAL HIGH (ref 65–99)
Potassium: 3.8 mmol/L (ref 3.5–5.1)
Sodium: 140 mmol/L (ref 135–145)

## 2017-04-13 LAB — APTT: aPTT: 35 seconds (ref 24–36)

## 2017-04-13 LAB — CBC
HCT: 40.5 % (ref 39.0–52.0)
Hemoglobin: 13.4 g/dL (ref 13.0–17.0)
MCH: 30.5 pg (ref 26.0–34.0)
MCHC: 33.1 g/dL (ref 30.0–36.0)
MCV: 92 fL (ref 78.0–100.0)
Platelets: 182 10*3/uL (ref 150–400)
RBC: 4.4 MIL/uL (ref 4.22–5.81)
RDW: 13.5 % (ref 11.5–15.5)
WBC: 7.2 10*3/uL (ref 4.0–10.5)

## 2017-04-13 LAB — MAGNESIUM: Magnesium: 2 mg/dL (ref 1.7–2.4)

## 2017-04-13 LAB — PROTIME-INR
INR: 1.16
Prothrombin Time: 14.7 seconds (ref 11.4–15.2)

## 2017-04-13 SURGERY — INSERTION, ENDOVASCULAR STENT GRAFT, AORTA, ABDOMINAL
Anesthesia: General

## 2017-04-13 MED ORDER — ONDANSETRON HCL 4 MG/2ML IJ SOLN
INTRAMUSCULAR | Status: DC | PRN
  Administered 2017-04-13: 4 mg via INTRAVENOUS

## 2017-04-13 MED ORDER — PROPOFOL 10 MG/ML IV BOLUS
INTRAVENOUS | Status: DC | PRN
  Administered 2017-04-13: 150 mg via INTRAVENOUS

## 2017-04-13 MED ORDER — ACETAMINOPHEN 650 MG RE SUPP: 325.0000 mg | RECTAL | Status: DC | PRN

## 2017-04-13 MED ORDER — PANTOPRAZOLE SODIUM 40 MG PO TBEC
40.0000 mg | DELAYED_RELEASE_TABLET | Freq: Every day | ORAL | Status: DC
  Administered 2017-04-14: 40 mg via ORAL
  Filled 2017-04-13: qty 1

## 2017-04-13 MED ORDER — DEXAMETHASONE SODIUM PHOSPHATE 10 MG/ML IJ SOLN
INTRAMUSCULAR | Status: DC | PRN
  Administered 2017-04-13: 10 mg via INTRAVENOUS

## 2017-04-13 MED ORDER — PHENYLEPHRINE 40 MCG/ML (10ML) SYRINGE FOR IV PUSH (FOR BLOOD PRESSURE SUPPORT)
PREFILLED_SYRINGE | INTRAVENOUS | Status: AC
  Filled 2017-04-13: qty 10

## 2017-04-13 MED ORDER — MORPHINE SULFATE (PF) 2 MG/ML IV SOLN: 2.0000 mg | INTRAVENOUS | Status: DC | PRN

## 2017-04-13 MED ORDER — PROPOFOL 10 MG/ML IV BOLUS
INTRAVENOUS | Status: AC
  Filled 2017-04-13: qty 20

## 2017-04-13 MED ORDER — PHENOL 1.4 % MT LIQD: 1.0000 | OROMUCOSAL | Status: DC | PRN

## 2017-04-13 MED ORDER — HEPARIN SODIUM (PORCINE) 1000 UNIT/ML IJ SOLN
INTRAMUSCULAR | Status: AC
  Filled 2017-04-13: qty 1

## 2017-04-13 MED ORDER — ONDANSETRON HCL 4 MG/2ML IJ SOLN: 4.0000 mg | Freq: Four times a day (QID) | INTRAMUSCULAR | Status: DC | PRN

## 2017-04-13 MED ORDER — DOCUSATE SODIUM 100 MG PO CAPS
100.0000 mg | ORAL_CAPSULE | Freq: Every day | ORAL | Status: DC
  Administered 2017-04-14: 100 mg via ORAL
  Filled 2017-04-13: qty 1

## 2017-04-13 MED ORDER — HEPARIN SODIUM (PORCINE) 5000 UNIT/ML IJ SOLN
INTRAMUSCULAR | Status: DC | PRN
  Administered 2017-04-13 (×2): 500 mL

## 2017-04-13 MED ORDER — HYDRALAZINE HCL 20 MG/ML IJ SOLN: 5.0000 mg | INTRAMUSCULAR | Status: DC | PRN

## 2017-04-13 MED ORDER — METOPROLOL TARTRATE 5 MG/5ML IV SOLN: 2.0000 mg | INTRAVENOUS | Status: DC | PRN

## 2017-04-13 MED ORDER — BISACODYL 10 MG RE SUPP: 10.0000 mg | Freq: Every day | RECTAL | Status: DC | PRN

## 2017-04-13 MED ORDER — SODIUM CHLORIDE 0.9 % IJ SOLN
INTRAMUSCULAR | Status: AC
  Filled 2017-04-13: qty 10

## 2017-04-13 MED ORDER — POLYETHYLENE GLYCOL 3350 17 G PO PACK: 17.0000 g | PACK | Freq: Every day | ORAL | Status: DC | PRN

## 2017-04-13 MED ORDER — FENTANYL CITRATE (PF) 100 MCG/2ML IJ SOLN
INTRAMUSCULAR | Status: DC | PRN
  Administered 2017-04-13: 50 ug via INTRAVENOUS
  Administered 2017-04-13: 75 ug via INTRAVENOUS
  Administered 2017-04-13: 25 ug via INTRAVENOUS

## 2017-04-13 MED ORDER — PHENYLEPHRINE 40 MCG/ML (10ML) SYRINGE FOR IV PUSH (FOR BLOOD PRESSURE SUPPORT)
PREFILLED_SYRINGE | INTRAVENOUS | Status: DC | PRN
  Administered 2017-04-13: 80 ug via INTRAVENOUS

## 2017-04-13 MED ORDER — MEPERIDINE HCL 50 MG/ML IJ SOLN: 6.2500 mg | INTRAMUSCULAR | Status: DC | PRN

## 2017-04-13 MED ORDER — DEXTROSE 5 % IV SOLN
INTRAVENOUS | Status: AC
  Filled 2017-04-13: qty 1.5

## 2017-04-13 MED ORDER — GLYCOPYRROLATE 0.2 MG/ML IV SOSY
PREFILLED_SYRINGE | INTRAVENOUS | Status: DC | PRN
  Administered 2017-04-13: .2 mg via INTRAVENOUS

## 2017-04-13 MED ORDER — LACTATED RINGERS IV SOLN
INTRAVENOUS | Status: DC | PRN
  Administered 2017-04-13: 07:00:00 via INTRAVENOUS

## 2017-04-13 MED ORDER — LABETALOL HCL 5 MG/ML IV SOLN: 10.0000 mg | INTRAVENOUS | Status: DC | PRN

## 2017-04-13 MED ORDER — EPHEDRINE 5 MG/ML INJ
INTRAVENOUS | Status: AC
  Filled 2017-04-13: qty 10

## 2017-04-13 MED ORDER — SODIUM CHLORIDE 0.9 % IV SOLN: INTRAVENOUS | Status: DC

## 2017-04-13 MED ORDER — PROTAMINE SULFATE 10 MG/ML IV SOLN
INTRAVENOUS | Status: AC
  Filled 2017-04-13: qty 5

## 2017-04-13 MED ORDER — HEPARIN SODIUM (PORCINE) 5000 UNIT/ML IJ SOLN: 5000.0000 [IU] | Freq: Three times a day (TID) | INTRAMUSCULAR | Status: DC

## 2017-04-13 MED ORDER — ATORVASTATIN CALCIUM 20 MG PO TABS: 20.0000 mg | ORAL_TABLET | Freq: Every day | ORAL | Status: DC

## 2017-04-13 MED ORDER — MAGNESIUM SULFATE 2 GM/50ML IV SOLN: 2.0000 g | Freq: Every day | INTRAVENOUS | Status: DC | PRN

## 2017-04-13 MED ORDER — HYDROMORPHONE HCL 1 MG/ML IJ SOLN: 0.2500 mg | INTRAMUSCULAR | Status: DC | PRN

## 2017-04-13 MED ORDER — CHLORHEXIDINE GLUCONATE 4 % EX LIQD: 60.0000 mL | Freq: Once | CUTANEOUS | Status: DC

## 2017-04-13 MED ORDER — LACTATED RINGERS IV SOLN
INTRAVENOUS | Status: DC | PRN
  Administered 2017-04-13 (×2): via INTRAVENOUS

## 2017-04-13 MED ORDER — GUAIFENESIN-DM 100-10 MG/5ML PO SYRP: 15.0000 mL | ORAL_SOLUTION | ORAL | Status: DC | PRN

## 2017-04-13 MED ORDER — PHENYLEPHRINE HCL 10 MG/ML IJ SOLN
INTRAVENOUS | Status: DC | PRN
  Administered 2017-04-13: 50 ug/min via INTRAVENOUS

## 2017-04-13 MED ORDER — SODIUM CHLORIDE 0.9 % IV SOLN: 500.0000 mL | Freq: Once | INTRAVENOUS | Status: DC | PRN

## 2017-04-13 MED ORDER — MIDAZOLAM HCL 2 MG/2ML IJ SOLN
INTRAMUSCULAR | Status: AC
  Filled 2017-04-13: qty 2

## 2017-04-13 MED ORDER — PROTAMINE SULFATE 10 MG/ML IV SOLN
INTRAVENOUS | Status: DC | PRN
  Administered 2017-04-13: 50 mg via INTRAVENOUS

## 2017-04-13 MED ORDER — HEPARIN SODIUM (PORCINE) 1000 UNIT/ML IJ SOLN
INTRAMUSCULAR | Status: AC
  Filled 2017-04-13: qty 2

## 2017-04-13 MED ORDER — ONDANSETRON HCL 4 MG/2ML IJ SOLN: 4.0000 mg | Freq: Once | INTRAMUSCULAR | Status: DC | PRN

## 2017-04-13 MED ORDER — ACETAMINOPHEN 325 MG PO TABS
325.0000 mg | ORAL_TABLET | ORAL | Status: DC | PRN
  Administered 2017-04-13 (×2): 650 mg via ORAL
  Filled 2017-04-13 (×2): qty 2

## 2017-04-13 MED ORDER — 0.9 % SODIUM CHLORIDE (POUR BTL) OPTIME
TOPICAL | Status: DC | PRN
  Administered 2017-04-13: 1000 mL

## 2017-04-13 MED ORDER — MIDAZOLAM HCL 5 MG/5ML IJ SOLN
INTRAMUSCULAR | Status: DC | PRN
  Administered 2017-04-13: 2 mg via INTRAVENOUS

## 2017-04-13 MED ORDER — SODIUM CHLORIDE 0.9 % IV SOLN
1.5000 g | Freq: Two times a day (BID) | INTRAVENOUS | Status: AC
  Administered 2017-04-13 – 2017-04-14 (×2): 1.5 g via INTRAVENOUS
  Filled 2017-04-13 (×2): qty 1.5

## 2017-04-13 MED ORDER — DEXTROSE 5 % IV SOLN
1.5000 g | INTRAVENOUS | Status: AC
  Administered 2017-04-13: 1.5 g via INTRAVENOUS

## 2017-04-13 MED ORDER — IODIXANOL 320 MG/ML IV SOLN
INTRAVENOUS | Status: DC | PRN
  Administered 2017-04-13: 150 mL via INTRAVENOUS
  Administered 2017-04-13: 50 mL via INTRAVENOUS

## 2017-04-13 MED ORDER — FENTANYL CITRATE (PF) 250 MCG/5ML IJ SOLN
INTRAMUSCULAR | Status: AC
  Filled 2017-04-13: qty 5

## 2017-04-13 MED ORDER — EPHEDRINE SULFATE-NACL 50-0.9 MG/10ML-% IV SOSY
PREFILLED_SYRINGE | INTRAVENOUS | Status: DC | PRN
  Administered 2017-04-13: 5 mg via INTRAVENOUS
  Administered 2017-04-13: 10 mg via INTRAVENOUS

## 2017-04-13 MED ORDER — POTASSIUM CHLORIDE CRYS ER 20 MEQ PO TBCR: 20.0000 meq | EXTENDED_RELEASE_TABLET | Freq: Every day | ORAL | Status: DC | PRN

## 2017-04-13 MED ORDER — OXYCODONE-ACETAMINOPHEN 5-325 MG PO TABS: 1.0000 | ORAL_TABLET | ORAL | Status: DC | PRN

## 2017-04-13 MED ORDER — LIDOCAINE 2% (20 MG/ML) 5 ML SYRINGE
INTRAMUSCULAR | Status: DC | PRN
  Administered 2017-04-13: 60 mg via INTRAVENOUS

## 2017-04-13 MED ORDER — HEPARIN SODIUM (PORCINE) 1000 UNIT/ML IJ SOLN
INTRAMUSCULAR | Status: DC | PRN
  Administered 2017-04-13: 6000 [IU] via INTRAVENOUS

## 2017-04-13 MED ORDER — ROCURONIUM BROMIDE 10 MG/ML (PF) SYRINGE
PREFILLED_SYRINGE | INTRAVENOUS | Status: DC | PRN
  Administered 2017-04-13: 50 mg via INTRAVENOUS

## 2017-04-13 MED ORDER — ALUM & MAG HYDROXIDE-SIMETH 200-200-20 MG/5ML PO SUSP: 15.0000 mL | ORAL | Status: DC | PRN

## 2017-04-13 MED ORDER — ONDANSETRON HCL 4 MG/2ML IJ SOLN
INTRAMUSCULAR | Status: AC
  Filled 2017-04-13: qty 2

## 2017-04-13 MED ORDER — DEXAMETHASONE SODIUM PHOSPHATE 10 MG/ML IJ SOLN
INTRAMUSCULAR | Status: AC
  Filled 2017-04-13: qty 1

## 2017-04-13 SURGICAL SUPPLY — 73 items
ADH SKN CLS APL DERMABOND .7 (GAUZE/BANDAGES/DRESSINGS) ×2
CANISTER SUCT 3000ML PPV (MISCELLANEOUS) ×2 IMPLANT
CATH ANGIO 5F BER2 65CM (CATHETERS) IMPLANT
CATH BEACON 5.038 65CM KMP-01 (CATHETERS) ×1 IMPLANT
CATH CROSS OVER TEMPO 5F (CATHETERS) ×1 IMPLANT
CATH OMNI FLUSH .035X70CM (CATHETERS) IMPLANT
CATH QUICKCROSS SUPP .035X90CM (MICROCATHETER) ×1 IMPLANT
CATH SOFT-VU 4F 65 STRAIGHT (CATHETERS) IMPLANT
CATH SOFT-VU STRAIGHT 4F 65CM (CATHETERS) ×2
CLIP LIGATING EXTRA MED SLVR (CLIP) IMPLANT
CLIP LIGATING EXTRA SM BLUE (MISCELLANEOUS) IMPLANT
COIL IDC 2D .035 6MMX20CM (Vascular Products) ×1 IMPLANT
COIL IDC 2D .035 8MMX20CM (Vascular Products) ×1 IMPLANT
COVER BACK TABLE 80X110 HD (DRAPES) ×1 IMPLANT
COVER PROBE W GEL 5X96 (DRAPES) ×2 IMPLANT
DERMABOND ADVANCED (GAUZE/BANDAGES/DRESSINGS) ×2
DERMABOND ADVANCED .7 DNX12 (GAUZE/BANDAGES/DRESSINGS) ×1 IMPLANT
DEVICE CLOSURE PERCLS PRGLD 6F (VASCULAR PRODUCTS) IMPLANT
DEVICE TORQUE KENDALL .025-038 (MISCELLANEOUS) ×1 IMPLANT
DRSG TEGADERM 2-3/8X2-3/4 SM (GAUZE/BANDAGES/DRESSINGS) ×4 IMPLANT
DRYSEAL FLEXSHEATH 12FR 33CM (SHEATH) ×2
DRYSEAL FLEXSHEATH 18FR 33CM (SHEATH) ×1
ELECT REM PT RETURN 9FT ADLT (ELECTROSURGICAL) ×4
ELECTRODE REM PT RTRN 9FT ADLT (ELECTROSURGICAL) ×2 IMPLANT
EXCLUDER TNK LEG 26MX14X18 (Endovascular Graft) IMPLANT
EXCLUDER TRUNK LEG 26MX14X18 (Endovascular Graft) ×2 IMPLANT
GAUZE SPONGE 2X2 8PLY NS (GAUZE/BANDAGES/DRESSINGS) ×4 IMPLANT
GAUZE SPONGE 4X4 16PLY XRAY LF (GAUZE/BANDAGES/DRESSINGS) ×2 IMPLANT
GLOVE BIO SURGEON STRL SZ 6.5 (GLOVE) ×2 IMPLANT
GLOVE BIO SURGEON STRL SZ7.5 (GLOVE) ×1 IMPLANT
GLOVE BIOGEL PI IND STRL 6.5 (GLOVE) IMPLANT
GLOVE BIOGEL PI IND STRL 8 (GLOVE) IMPLANT
GLOVE BIOGEL PI INDICATOR 6.5 (GLOVE) ×2
GLOVE BIOGEL PI INDICATOR 8 (GLOVE) ×1
GLOVE SS BIOGEL STRL SZ 7.5 (GLOVE) ×1 IMPLANT
GLOVE SUPERSENSE BIOGEL SZ 7.5 (GLOVE) ×1
GOWN STRL REUS W/ TWL LRG LVL3 (GOWN DISPOSABLE) ×3 IMPLANT
GOWN STRL REUS W/TWL LRG LVL3 (GOWN DISPOSABLE) ×6
GRAFT BALLN CATH 65CM (STENTS) IMPLANT
GUIDEWIRE ANGLED .035X150CM (WIRE) ×1 IMPLANT
GUIDEWIRE ANGLED .035X260CM (WIRE) ×1 IMPLANT
KIT BASIN OR (CUSTOM PROCEDURE TRAY) ×2 IMPLANT
KIT ROOM TURNOVER OR (KITS) ×2 IMPLANT
LEG CONTRALETERAL16X16X11.5 (Endovascular Graft) ×2 IMPLANT
NDL PERC 18GX7CM (NEEDLE) ×1 IMPLANT
NEEDLE PERC 18GX7CM (NEEDLE) ×2 IMPLANT
NS IRRIG 1000ML POUR BTL (IV SOLUTION) ×2 IMPLANT
PACK ENDOVASCULAR (PACKS) ×2 IMPLANT
PAD ARMBOARD 7.5X6 YLW CONV (MISCELLANEOUS) ×4 IMPLANT
PERCLOSE PROGLIDE 6F (VASCULAR PRODUCTS) ×8
SHEATH AVANTI 11CM 8FR (MISCELLANEOUS) IMPLANT
SHEATH BRITE TIP 8FR 23CM (MISCELLANEOUS) ×1 IMPLANT
SHEATH DRYSEAL FLEX 12FR 33CM (SHEATH) IMPLANT
SHEATH DRYSEAL FLEX 18FR 33CM (SHEATH) IMPLANT
SHEATH PINNACLE 8F 10CM (SHEATH) ×2 IMPLANT
SHEATH PINNACLE ST 6F 45CM (SHEATH) ×1 IMPLANT
STAPLER VISISTAT 35W (STAPLE) IMPLANT
STENT GRAFT BALLN CATH 65CM (STENTS) ×1
STENT GRAFT CONTRALAT 16X11.5 (Endovascular Graft) IMPLANT
STOPCOCK MORSE 400PSI 3WAY (MISCELLANEOUS) ×3 IMPLANT
SUT ETHILON 3 0 PS 1 (SUTURE) IMPLANT
SUT PROLENE 5 0 C 1 24 (SUTURE) IMPLANT
SUT VIC AB 2-0 CTX 36 (SUTURE) IMPLANT
SUT VIC AB 3-0 SH 18 (SUTURE) IMPLANT
SUT VIC AB 3-0 SH 27 (SUTURE)
SUT VIC AB 3-0 SH 27X BRD (SUTURE) IMPLANT
SUT VICRYL 4-0 PS2 18IN ABS (SUTURE) ×4 IMPLANT
SYR 30ML LL (SYRINGE) ×2 IMPLANT
TOWEL GREEN STERILE (TOWEL DISPOSABLE) ×2 IMPLANT
TRAY FOLEY MTR SLVR 16FR STAT (CATHETERS) ×2 IMPLANT
TUBING HIGH PRESSURE 120CM (CONNECTOR) ×3 IMPLANT
WIRE AMPLATZ SS-J .035X180CM (WIRE) ×2 IMPLANT
WIRE BENTSON .035X145CM (WIRE) ×2 IMPLANT

## 2017-04-13 NOTE — Discharge Instructions (Signed)
° °[facility]  ° °Discharge Instructions ° °Endovascular Aortic Aneurysm Repair ° °Please refer to the following instructions for your post-procedure care. Your surgeon or Physician Assistant will discuss any changes with you. ° °Activity ° °You are encouraged to walk as much as you can. You can slowly return to normal activities but must avoid strenuous activity and heavy lifting until your doctor tells you it's OK. Avoid activities such as vacuuming or swinging a gold club. It is normal to feel tired for several weeks after your surgery. Do not drive until your doctor gives the OK and you are no longer taking prescription pain medications. It is also normal to have difficulty with sleep habits, eating, and bowel movements after surgery. These will go away with time. ° °Bathing/Showering ° °You may shower after you go home. If you have an incision, do not soak in a bathtub, hot tub, or swim until the incision heals completely. ° °If you have incisions in your groin, sash the groin wounds with soap and water daily and pat dry. (No tub bath-only shower)  Then put a dry gauze or washcloth there to keep this area dry to help prevent wound infection daily and as needed.  Do not use Vaseline or neosporin on your incisions.  Only use soap and water on your incisions and then protect and keep dry. ° °Incision Care ° °Shower every day. Clean your incision with mild soap and water. Pat the area dry with a clean towel. You do not need a bandage unless otherwise instructed. Do not apply any ointments or creams to your incision. If you clothing is irritating, you may cover your incision with a dry gauze pad. ° °Diet ° °Resume your normal diet. There are no special food restrictions following this procedure. A low fat/low cholesterol diet is recommended for all patients with vascular disease. In order to heal from your surgery, it is CRITICAL to get adequate nutrition. Your body requires  vitamins, minerals, and protein. Vegetables are the best source of vitamins and minerals. Vegetables also provide the perfect balance of protein. Processed food has little nutritional value, so try to avoid this. ° °Medications ° °Resume taking all of your medications unless your doctor or nurse practitioner tells you not to. If your incision is causing pain, you may take over-the-counter pain relievers such as acetaminophen (Tylenol). If you were prescribed a stronger pain medication, please be aware these medications can cause nausea and constipation. Prevent nausea by taking the medication with a snack or meal. Avoid constipation by drinking plenty of fluids and eating foods with a high amount of fiber, such as fruits, vegetables, and grains. Do not take Tylenol if you are taking prescription pain medications. ° ° °Follow up ° °Our office will schedule a follow-up appointment with a C.T. scan 3-4 weeks after your surgery. ° °Please call us immediately for any of the following conditions ° °Severe or worsening pain in your legs or feet or in your abdomen back or chest. °Increased pain, redness, drainage (pus) from your incision sit. °Increased abdominal pain, bloating, nausea, vomiting or persistent diarrhea. °Fever of 101 degrees or higher. °Swelling in your leg (s), ° °Reduce your risk of vascular disease ° °•Stop smoking. If you would like help call [facility] at 1-800-QUIT-NOW (1-800-784-8669) or [facility] at 336-586-4000. °•Manage your cholesterol °•Maintain a desired weight °•Control your diabetes °•Keep your blood pressure down ° °If you have questions, please call the office at [phone]. ° °

## 2017-04-13 NOTE — Anesthesia Postprocedure Evaluation (Signed)
Anesthesia Post Note  Patient: Charles Stephenson  Procedure(s) Performed: ABDOMINAL AORTIC ENDOVASCULAR STENT GRAFT (N/A ) EMBOLIZATION LEFT HYPOGASTRIC USING INTERLOCK-35 DETACHABLE COIL (N/A )     Patient location during evaluation: PACU Anesthesia Type: General Level of consciousness: awake and alert Pain management: pain level controlled Vital Signs Assessment: post-procedure vital signs reviewed and stable Respiratory status: spontaneous breathing, nonlabored ventilation, respiratory function stable and patient connected to nasal cannula oxygen Cardiovascular status: blood pressure returned to baseline and stable Postop Assessment: no apparent nausea or vomiting Anesthetic complications: no    Last Vitals:  Vitals:   04/13/17 1200 04/13/17 1217  BP:  134/75  Pulse: (!) 51 (!) 59  Resp: 13 14  Temp:  36.5 C  SpO2: 100% 99%    Last Pain:  Vitals:   04/13/17 1217  TempSrc: Oral                 Shresta Risden DAVID

## 2017-04-13 NOTE — Anesthesia Procedure Notes (Signed)
Procedure Name: Intubation Date/Time: 04/13/2017 7:47 AM Performed by: Moshe Salisbury, CRNA Pre-anesthesia Checklist: Patient identified, Emergency Drugs available, Suction available and Patient being monitored Patient Re-evaluated:Patient Re-evaluated prior to induction Oxygen Delivery Method: Circle System Utilized Preoxygenation: Pre-oxygenation with 100% oxygen Induction Type: IV induction Ventilation: Mask ventilation without difficulty Laryngoscope Size: Mac and 4 Grade View: Grade II Tube type: Oral Tube size: 8.0 mm Number of attempts: 1 Airway Equipment and Method: Stylet Placement Confirmation: ETT inserted through vocal cords under direct vision,  positive ETCO2 and breath sounds checked- equal and bilateral Secured at: 23 cm Tube secured with: Tape Dental Injury: Teeth and Oropharynx as per pre-operative assessment

## 2017-04-13 NOTE — Anesthesia Preprocedure Evaluation (Signed)
Anesthesia Evaluation  Patient identified by MRN, date of birth, ID band Patient awake    Reviewed: Allergy & Precautions, NPO status , Patient's Chart, lab work & pertinent test results  Airway Mallampati: I  TM Distance: >3 FB Neck ROM: Full    Dental   Pulmonary    Pulmonary exam normal        Cardiovascular + Peripheral Vascular Disease  Normal cardiovascular exam     Neuro/Psych    GI/Hepatic   Endo/Other    Renal/GU      Musculoskeletal   Abdominal   Peds  Hematology   Anesthesia Other Findings   Reproductive/Obstetrics                             Anesthesia Physical Anesthesia Plan  ASA: III  Anesthesia Plan: General   Post-op Pain Management:    Induction: Intravenous  PONV Risk Score and Plan: 2 and Ondansetron and Treatment may vary due to age or medical condition  Airway Management Planned: Oral ETT  Additional Equipment: Arterial line  Intra-op Plan:   Post-operative Plan: Extubation in OR  Informed Consent: I have reviewed the patients History and Physical, chart, labs and discussed the procedure including the risks, benefits and alternatives for the proposed anesthesia with the patient or authorized representative who has indicated his/her understanding and acceptance.     Plan Discussed with: CRNA and Surgeon  Anesthesia Plan Comments:         Anesthesia Quick Evaluation

## 2017-04-13 NOTE — Interval H&P Note (Signed)
History and Physical Interval Note:  04/13/2017 7:16 AM  Charles Stephenson  has presented today for surgery, with the diagnosis of ABDOMINAL AORTIC ANEURYSM  The various methods of treatment have been discussed with the patient and family. After consideration of risks, benefits and other options for treatment, the patient has consented to  Procedure(s): ABDOMINAL AORTIC ENDOVASCULAR STENT GRAFT (N/A) EMBOLIZATION LEFT HYPOGASTRIC (N/A) as a surgical intervention .  The patient's history has been reviewed, patient examined, no change in status, stable for surgery.  I have reviewed the patient's chart and labs.  Questions were answered to the patient's satisfaction.     Curt Jews

## 2017-04-13 NOTE — Op Note (Signed)
OPERATIVE REPORT  DATE OF SURGERY: 04/13/2017  PATIENT: Charles Stephenson, 83 y.o. male MRN: [id number]  DOB: Feb 08, 1935  PRE-OPERATIVE DIAGNOSIS: Abdominal aortic aneurysm and left internal iliac artery aneurysm  POST-OPERATIVE DIAGNOSIS:  Same  PROCEDURE: Gore Excluder stent graft repair of abdominal aortic aneurysm and internal iliac artery aneurysm coil embolization of left internal iliac branches  SURGEON:  Curt Jews, M.D.  PHYSICIAN ASSISTANT: Dr. Shanon Rosser, Liana Crocker PA-C  ANESTHESIA: General  EBL: Minimal ml  Total I/O In: 2066.8 [I.V.:1616.8; Other:400; IV Piggyback:50] Out: 650 [Urine:600; Blood:50]  BLOOD ADMINISTERED: None  DRAINS: None  SPECIMEN: None  COUNTS CORRECT:  YES  PLAN OF CARE: PACU  PATIENT DISPOSITION:  PACU - hemodynamically stable  PROCEDURE DETAILS: The patient was taken to the operating room placed supine position where the area of the abdomen and both groins were prepped and draped in usual sterile fashion.  SonoSite ultrasound was used to visualize the common femoral arteries bilaterally.  18-gauge needle was used to access the laterally and Bentson wires were passed centrally through the 18-gauge needle.  An 8 dilator was passed over the guidewire and 2 Perclose devices were brought onto the field and were positioned at the 10:00 and 2 o'clock position and partially deployed.  The suture tags were held for perclosure at the end of the procedure.  This was done bilaterally.  Next an 8 [place] sheath was passed over the guidewires bilaterally.  A crossover catheter was then positioned through the right sheath up to the level of the aortic bifurcation and a guidewire was passed down into the internal iliac artery.  Straight catheter was then exchanged over the guiding catheter and a Glidewire was positioned into the hypogastric aneurysm.  Injection through the catheter revealed that this was in the internal iliac aneurysm and there were  2 large branches arising from the internal guiding catheter was used to manipulate the long angled Glidewire into 1 of these 2 dominant branches and the crossover catheter was crossed across the orifice of this into the large branch.  The [employer] interlock coils were brought onto the field and a 6 x 20 cm coil was positioned in this large branch.  There was good positioning of this.  Next the hand injection revealed the other large branch and the straight catheter was used to enter into this.  An 8 x 20 interlock coil was positioned down this large branch.  The catheter was then withdrawn back into the aorta.  Kumpe catheters were used bilaterally to pass the Bentson wire up into the suprarenal aorta and the Berenstein catheters were exchanged for Amplatz superstiff wires.  The patient was given 6000 units of intravenous heparin.  After adequate circulation time the 12 French sheath was passed over the right Amplatz wire after exchanging the 8 [place] sheath out.  On the left and an 5 French sheath was exchanged for the 8 [place].  The main body of the device was placed in a crosslegged position.  This was a 16 x 14 x 18 cm main body this was positioned at the level of the renal arteries.  A pigtail catheter was then positioned through the right groin into the super contrast injection revealed a level of the renal artery takeoffs with the right renal artery being the lowest takeoff.  The main body of the graft was deployed to land just below the level of the left renal artery and this again repeat injection.  Next the pigtail  catheter was removed and the contralateral gate was cannulated with a guidewire from the right groin.  The pigtail catheter was repositioned in the main body and was quarreled to assure that this was in the main body of the graft.  A retrograde injection through the right femoral sheath revealed a level of the right hypogastric artery takeoff.  A 16 x 11-1/2 cm contralateral limb was  chosen and this was positioned with the appropriate overlap in the main body and deployed a line just above the hypogastric takeoff on the right.  Next the completion of the main body was deployed with the 18 cm length into the left external iliac artery.  Hand-injection through the left sheath revealed excellent closure over the hypogastric takeoff.  Next AM0B 37 balloon was used to dilate the junctions and the attachment sites.  The pigtail catheter was positioned above the level of the renal arteries and a final injection revealed no evidence of endoleak and a complete exclusion of the left internal iliac artery aneurysm.  The Amplatz Super Stiff wires were exchanged for Bernstein wires over a guidewire.  The sheath were removed and the prior placed Perclose devices were deployed giving excellent hemostasis.  Guidewires were removed and the Perclose devices were trimmed below the surface.  A 4-0 Vicryl sutures were used to close the stab wounds in each groin.  Sterile dressing was applied.  The patient had a easily palpable dorsalis pedis pulses bilaterally and was transferred to the recovery room in stable condition   Rosetta Posner, M.D., [facility] 04/13/2017 2:40 PM

## 2017-04-13 NOTE — Transfer of Care (Signed)
Immediate Anesthesia Transfer of Care Note  Patient: Charles Stephenson  Procedure(s) Performed: ABDOMINAL AORTIC ENDOVASCULAR STENT GRAFT (N/A ) EMBOLIZATION LEFT HYPOGASTRIC USING INTERLOCK-35 DETACHABLE COIL (N/A )  Patient Location: PACU  Anesthesia Type:General  Level of Consciousness: awake, oriented and patient cooperative  Airway & Oxygen Therapy: Patient Spontanous Breathing and Patient connected to nasal cannula oxygen  Post-op Assessment: Report given to RN, Post -op Vital signs reviewed and stable and Patient moving all extremities  Post vital signs: Reviewed and stable  Last Vitals:  Vitals:   04/13/17 0554  BP: (!) 167/73  Pulse: (!) 50  Resp: 18  Temp: 36.5 C  SpO2: 99%    Last Pain:  Vitals:   04/13/17 0554  TempSrc: Oral      Patients Stated Pain Goal: 1 (86/77/37 3668)  Complications: No apparent anesthesia complications

## 2017-04-13 NOTE — Anesthesia Procedure Notes (Signed)
Arterial Line Insertion Start/End2/01/2018 7:00 AM, 04/13/2017 7:10 AM Performed by: Moshe Salisbury, CRNA, CRNA  Patient location: Pre-op. Preanesthetic checklist: patient identified, IV checked, site marked, risks and benefits discussed, surgical consent, monitors and equipment checked, pre-op evaluation, timeout performed and anesthesia consent Lidocaine 1% used for infiltration and patient sedated Right, radial was placed Catheter size: 20 G Hand hygiene performed , maximum sterile barriers used  and Seldinger technique used  Attempts: 1 Procedure performed without using ultrasound guided technique. Following insertion, dressing applied and Biopatch. Post procedure assessment: normal  Patient tolerated the procedure well with no immediate complications.

## 2017-04-14 LAB — BASIC METABOLIC PANEL
Anion gap: 9 (ref 5–15)
BUN: 14 mg/dL (ref 6–20)
CO2: 25 mmol/L (ref 22–32)
Calcium: 9 mg/dL (ref 8.9–10.3)
Chloride: 105 mmol/L (ref 101–111)
Creatinine, Ser: 0.89 mg/dL (ref 0.61–1.24)
GFR calc Af Amer: >60 mL/min (ref >60)
GFR calc non Af Amer: >60 mL/min (ref >60)
Glucose, Bld: 125 mg/dL — ABNORMAL HIGH (ref 65–99)
Potassium: 4.5 mmol/L (ref 3.5–5.1)
Sodium: 139 mmol/L (ref 135–145)

## 2017-04-14 LAB — CBC
HCT: 37.8 % — ABNORMAL LOW (ref 39.0–52.0)
Hemoglobin: 12.3 g/dL — ABNORMAL LOW (ref 13.0–17.0)
MCH: 29.9 pg (ref 26.0–34.0)
MCHC: 32.5 g/dL (ref 30.0–36.0)
MCV: 92 fL (ref 78.0–100.0)
Platelets: 185 10*3/uL (ref 150–400)
RBC: 4.11 MIL/uL — ABNORMAL LOW (ref 4.22–5.81)
RDW: 13.2 % (ref 11.5–15.5)
WBC: 14.7 10*3/uL — ABNORMAL HIGH (ref 4.0–10.5)

## 2017-04-14 MED ORDER — OXYCODONE-ACETAMINOPHEN 5-325 MG PO TABS: 1.0000 | ORAL_TABLET | Freq: Four times a day (QID) | ORAL | 0 refills | Status: DC | PRN

## 2017-04-14 NOTE — Progress Notes (Addendum)
  Progress Note    04/14/2017 8:52 AM 1 Day Post-Op  Subjective:  No complaints-wants to go home  Afebrile HR 50's-70's 035'K-093'G systolic 18% RA  Vitals:   04/14/17 0413 04/14/17 0806  BP: (!) 117/56 136/85  Pulse: 60 100  Resp: 15   Temp: (!) 97.4 F (36.3 C)   SpO2: 97% 97%    Physical Exam: Cardiac:  regular Lungs:  Non labored Incisions:  Bilateral groins are soft without hematoma Extremities:  Easily palpable DP pulses bilaterally Abdomen:  Soft, NT/ND  CBC    Component Value Date/Time   WBC 14.7 (H) 04/14/2017 0317   RBC 4.11 (L) 04/14/2017 0317   HGB 12.3 (L) 04/14/2017 0317   HCT 37.8 (L) 04/14/2017 0317   PLT 185 04/14/2017 0317   MCV 92.0 04/14/2017 0317   MCH 29.9 04/14/2017 0317   MCHC 32.5 04/14/2017 0317   RDW 13.2 04/14/2017 0317   LYMPHSABS 1.2 05/29/2016 0853   MONOABS 0.6 05/29/2016 0853   EOSABS 0.2 05/29/2016 0853   BASOSABS 0.0 05/29/2016 0853    BMET    Component Value Date/Time   NA 139 04/14/2017 0317   NA 142 05/10/2015   K 4.5 04/14/2017 0317   CL 105 04/14/2017 0317   CO2 25 04/14/2017 0317   GLUCOSE 125 (H) 04/14/2017 0317   BUN 14 04/14/2017 0317   BUN 16 05/10/2015   CREATININE 0.89 04/14/2017 0317   CALCIUM 9.0 04/14/2017 0317   GFRNONAA >60 04/14/2017 0317   GFRAA >60 04/14/2017 0317    INR    Component Value Date/Time   INR 1.16 04/13/2017 1244     Intake/Output Summary (Last 24 hours) at 04/14/2017 0852 Last data filed at 04/14/2017 0610 Gross per 24 hour  Intake 2886.75 ml  Output 3000 ml  Net -113.25 ml     Assessment:  83 y.o. male is s/p:  Gore Excluder stent graft repair of abdominal aortic aneurysm and internal iliac artery aneurysm coil embolization of left internal iliac branches  1 Day Post-Op  Plan: -pt doing well-the foley is out and he has voided and ambulated.   -will discharge home today and f/u with Dr. Donnetta Hutching in 4 weeks with CTA protocol.    Leontine Locket, PA-C [facility] [phone] 04/14/2017 8:52 AM  I have examined the patient, reviewed and agree with above.  Comfortable.  Abdomen benign.  Bilateral femoral access sites without hematoma.  Discharged home today Curt Jews, MD 04/14/2017 8:57 AM

## 2017-04-14 NOTE — Progress Notes (Signed)
A-line D/C and foley cath D/C and patient tolerated well.

## 2017-04-14 NOTE — Care Management Note (Signed)
Case Management Note Marvetta Gibbons RN, BSN Unit 4E-Case Manager [phone]  Patient Details  Name: Charles Stephenson MRN: [id number] Date of Birth: Jun 02, 1934  Subjective/Objective:  Pt admitted s/p AAA stent                  Action/Plan: PTA pt lived at home with spouse- plan to d/c home today- no CM needs noted for transition home  Expected Discharge Date:  04/14/17               Expected Discharge Plan:  Home/Self Care  In-House Referral:  NA  Discharge planning Services  CM Consult, NA  Post Acute Care Choice:  NA Choice offered to:  NA  DME Arranged:    DME Agency:     HH Arranged:    [place] Agency:     Status of Service:  Completed, signed off  If discussed at [place] of Stay Meetings, dates discussed:    Discharge Disposition: home/self care   Additional Comments:  Dawayne Patricia, RN 04/14/2017, 11:09 AM

## 2017-04-14 NOTE — Discharge Summary (Signed)
EVAR Discharge Summary   Charles Stephenson [birth year] 83 y.o. male  MRN: [id number]  Admission Date: 04/13/2017  Discharge Date: 04/14/17  Physician: Rosetta Posner, MD  Admission Diagnosis: ABDOMINAL AORTIC ANEURYSM   HPI:   This is a 83 y.o. male here today for follow-up.  Had discovery of infrarenal abdominal aortic aneurysm in 2011.  Has had serial ultrasounds since that time.  Had been relatively stable in the low 4 cm range for several years.  On most recent evaluation this was found to have 1 cm growth in to the low 5 cm range.  He underwent CT scan for further evaluation on 03/27/2017 and is here today for discussion of this.  He remains in excellent health of his age of 6.  No cardiac disease.  He is very active.  Of note his father died after elective repair of an aneurysm 27 years ago.  His brother had elective aneurysm repair several years later suffered an infected aortic graft and had removal of the graft with what sounds like extra-anatomic bypass and lives several beers and then died of pulmonary fibrosis.  He obviously is extremely concerned regarding his diagnosis.  Does report having a normal cardiac stress test several years ago and no difficulty with cardiac status.   Hospital Course:  The patient was admitted to the hospital and taken to the operating room on 04/13/2017 and underwent: Gore Excluder stent graft repair of abdominal aortic aneurysm and internal iliac artery aneurysm coil embolization of left internal iliac branches    The pt tolerated the procedure well and was transported to the PACU in good condition.   By POD 1, he was doing well.  His foley was removed and he was voiding without difficulty.  His groins were soft without hematoma.  He had easily palpable DP pulses bilaterally.  He is discharged home.  The remainder of the hospital course consisted of increasing mobilization and increasing intake of solids without difficulty.  CBC      Component Value Date/Time   WBC 14.7 (H) 04/14/2017 0317   RBC 4.11 (L) 04/14/2017 0317   HGB 12.3 (L) 04/14/2017 0317   HCT 37.8 (L) 04/14/2017 0317   PLT 185 04/14/2017 0317   MCV 92.0 04/14/2017 0317   MCH 29.9 04/14/2017 0317   MCHC 32.5 04/14/2017 0317   RDW 13.2 04/14/2017 0317   LYMPHSABS 1.2 05/29/2016 0853   MONOABS 0.6 05/29/2016 0853   EOSABS 0.2 05/29/2016 0853   BASOSABS 0.0 05/29/2016 0853    BMET    Component Value Date/Time   NA 139 04/14/2017 0317   NA 142 05/10/2015   K 4.5 04/14/2017 0317   CL 105 04/14/2017 0317   CO2 25 04/14/2017 0317   GLUCOSE 125 (H) 04/14/2017 0317   BUN 14 04/14/2017 0317   BUN 16 05/10/2015   CREATININE 0.89 04/14/2017 0317   CALCIUM 9.0 04/14/2017 0317   GFRNONAA >60 04/14/2017 0317   GFRAA >60 04/14/2017 0317         Discharge Diagnosis:  ABDOMINAL AORTIC ANEURYSM  Secondary Diagnosis: Patient Active Problem List   Diagnosis Date Noted  . Vertigo 05/29/2016  . Colon cancer screening 04/21/2016  . History of basal cell carcinoma of skin 12/10/2015  . Seasonal allergies   . Chest pain 03/12/2012  . Hyperlipidemia 03/12/2012  . AAA (abdominal aortic aneurysm) ([place]) 03/12/2012   Past Medical History:  Diagnosis Date  . AAA (abdominal aortic aneurysm) ([place])   . Basal cell carcinoma   .  Diverticulitis    3x through 18 months around 2012  . Hepatitis   . History of alcohol abuse    sober since age 19  . History of vertigo    december 2007   . Hypercholesterolemia   . Seasonal allergies    no rx     Allergies as of 04/14/2017   No Known Allergies     Medication List    TAKE these medications   atorvastatin 20 MG tablet Commonly known as:  LIPITOR Take 1 tablet (20 mg total) by mouth daily at 6 PM.   CENTRUM SILVER PO Take 1 tablet by mouth daily.   oxyCODONE-acetaminophen 5-325 MG tablet Commonly known as:  PERCOCET Take 1 tablet by mouth every 6 (six) hours as needed for severe pain.    PSYLLIUM FIBER PO Take 6-8 tablets by mouth at bedtime.   Vitamin D3 2000 units capsule Take 4,000 Units by mouth daily.       Discharge Instructions:  [facility]  Discharge Instructions Endovascular Aortic Aneurysm Repair  Please refer to the following instructions for your post-procedure care. Your surgeon or Physician Assistant will discuss any changes with you.  Activity  You are encouraged to walk as much as you can. You can slowly return to normal activities but must avoid strenuous activity and heavy lifting until your doctor tells you it's OK. Avoid activities such as vacuuming or swinging a gold club. It is normal to feel tired for several weeks after your surgery. Do not drive until your doctor gives the OK and you are no longer taking prescription pain medications. It is also normal to have difficulty with sleep habits, eating, and bowel movements after surgery. These will go away with time.  Bathing/Showering  You may shower after you go home. If you have an incision, do not soak in a bathtub, hot tub, or swim until the incision heals completely.  Incision Care  Shower every day. Clean your incision with mild soap and water. Pat the area dry with a clean towel. You do not need a bandage unless otherwise instructed. Do not apply any ointments or creams to your incision. If you clothing is irritating, you may cover your incision with a dry gauze pad.  Diet  Resume your normal diet. There are no special food restrictions following this procedure. A low fat/low cholesterol diet is recommended for all patients with vascular disease. In order to heal from your surgery, it is CRITICAL to get adequate nutrition. Your body requires vitamins, minerals, and protein. Vegetables are the best source of vitamins and minerals. Vegetables also provide the perfect balance of protein. Processed food has little nutritional value, so try to avoid  this.  Medications  Resume taking all of your medications unless your doctor or Physician Assistnat tells you not to. If your incision is causing pain, you may take over-the-counter pain relievers such as acetaminophen (Tylenol). If you were prescribed a stronger pain medication, please be aware these medications can cause nausea and constipation. Prevent nausea by taking the medication with a snack or meal. Avoid constipation by drinking plenty of fluids and eating foods with a high amount of fiber, such as fruits, vegetables, and grains. Do not take Tylenol if you are taking prescription pain medications.   Follow up  [place] office will schedule a follow-up appointment with a C.T. scan 3-4 weeks after your surgery.  Please call us immediately for any of the following conditions  Severe or  worsening pain in your legs or feet or in your abdomen back or chest. Increased pain, redness, drainage (pus) from your incision sit. Increased abdominal pain, bloating, nausea, vomiting or persistent diarrhea. Fever of 101 degrees or higher. Swelling in your leg (s),  Reduce your risk of vascular disease  .Stop smoking. If you would like help call [facility] at 1-800-QUIT-NOW [phone]) or [place] at [phone]. .Manage your cholesterol .Maintain a desired weight .Control your diabetes .Keep your blood pressure down  If you have questions, please call the office at [phone].    Prescriptions given: Roxicet #8 No Refill  Disposition: home  Patient's condition: is Good  Follow up: 1. Dr. Donnetta Hutching in 4 weeks with CTA protocol   Leontine Locket, PA-C [facility] [phone] 04/14/2017  9:01 AM   - For VQI Registry use - Post-op:  Time to Extubation: [x]  In OR, [ ]  < 12 hrs, [ ]  12-24 hrs, [ ]  >=24 hrs Vasopressors Req. Post-op: No MI: No., [ ]  Troponin only, [ ]  EKG or Clinical New Arrhythmia: No CHF: No ICU Stay: 1 day in stepdown Transfusion: No      If yes, n/a units given  Complications: Resp failure: No., [ ]  Pneumonia, [ ]  Ventilator Chg in renal function: No., [ ]  Inc. Cr > 0.5, [ ]  Temp. Dialysis,  [ ]  Permanent dialysis Leg ischemia: No., no Surgery needed, [ ]  Yes, Surgery needed,  [ ]  Amputation Bowel ischemia: No., [ ]  Medical Rx, [ ]  Surgical Rx Wound complication: No., [ ]  Superficial separation/infection, [ ]  Return to OR Return to OR: No  Return to OR for bleeding: No Stroke: No., [ ]  Minor, [ ]  Major  Discharge medications: Statin use:  Yes  ASA use:  No  Plavix use:  No  Beta blocker use:  No  ARB use:  No ACEI use:  No CCB use:  No

## 2017-04-15 ENCOUNTER — Other Ambulatory Visit: Payer: Self-pay

## 2017-04-15 ENCOUNTER — Encounter (HOSPITAL_COMMUNITY): Payer: Self-pay | Admitting: Vascular Surgery

## 2017-04-15 DIAGNOSIS — I714 Abdominal aortic aneurysm, without rupture, unspecified: Secondary | ICD-10-CM

## 2017-04-15 DIAGNOSIS — Z48812 Encounter for surgical aftercare following surgery on the circulatory system: Secondary | ICD-10-CM

## 2017-04-15 NOTE — Consult Note (Signed)
            [facility] CM Primary Care Navigator  04/15/2017  Kalix Meinecke 29-May-1934 [id number]   Went to seepatient at the bedsideto identify possible discharge needs but he was alreadydischargedper staff report.  Patient was discharged home yesterday.  Primary care provider's officeis listed as providing transition of care (TOC).  Patient has discharge instruction to follow-up with vascular surgery (status post repair of abdominal aortic aneurysm) in 4 weeks after discharge.   For additional questions please contact:  Edwena Felty A. Diron Haddon, BSN, RN-BC [facility] PRIMARY CARE Navigator Cell: [phone]

## 2017-04-21 ENCOUNTER — Ambulatory Visit: Payer: PPO | Admitting: Vascular Surgery

## 2017-04-21 DIAGNOSIS — M545 Low back pain, unspecified: Secondary | ICD-10-CM | POA: Insufficient documentation

## 2017-04-21 DIAGNOSIS — M47817 Spondylosis without myelopathy or radiculopathy, lumbosacral region: Secondary | ICD-10-CM | POA: Diagnosis not present

## 2017-05-12 ENCOUNTER — Other Ambulatory Visit: Payer: PPO

## 2017-05-12 ENCOUNTER — Ambulatory Visit
Admission: RE | Admit: 2017-05-12 | Discharge: 2017-05-12 | Disposition: A | Discharge status: Home / Self Care | Payer: PPO | Source: Ambulatory Visit | Attending: Vascular Surgery | Admitting: Vascular Surgery

## 2017-05-12 DIAGNOSIS — Z48812 Encounter for surgical aftercare following surgery on the circulatory system: Secondary | ICD-10-CM

## 2017-05-12 DIAGNOSIS — I714 Abdominal aortic aneurysm, without rupture, unspecified: Secondary | ICD-10-CM

## 2017-05-12 MED ORDER — IOPAMIDOL (ISOVUE-370) INJECTION 76%: 75.0000 mL | Freq: Once | INTRAVENOUS | Status: DC | PRN

## 2017-05-19 ENCOUNTER — Other Ambulatory Visit: Payer: Self-pay

## 2017-05-19 ENCOUNTER — Ambulatory Visit (INDEPENDENT_AMBULATORY_CARE_PROVIDER_SITE_OTHER): Payer: PPO | Admitting: Vascular Surgery

## 2017-05-19 ENCOUNTER — Other Ambulatory Visit: Payer: PPO

## 2017-05-19 ENCOUNTER — Encounter: Payer: Self-pay | Admitting: Vascular Surgery

## 2017-05-19 VITALS — BP 148/91 | HR 59 | Resp 20 | Ht 70.5 in | Wt 158.0 lb

## 2017-05-19 DIAGNOSIS — I714 Abdominal aortic aneurysm, without rupture, unspecified: Secondary | ICD-10-CM

## 2017-05-19 NOTE — Progress Notes (Signed)
Post-operative EVAR   History of Present Illness   Charles Stephenson is a 83 y.o. male who presents post-op s/p endovascular repair of AAA with coil embolization of left internal iliac artery aneurysm (04/13/17).  Most recent CTA (05/12/17) demonstrates: small type 2 endoleak but decrease in sac size from 5.3 to 4.6cm.  The patient has not had back or abdominal pain.  He did have some pain in L buttock radiating down into his toes a few days after surgery.  He was evaluated by Orthopedic office and was diagnosed with a muscle strain.  This has since completely resolved.  He denies LLE claudication or rest pain BLE.  He is taking a statin daily.  Past Medical History, Past Surgical History, Social History, Family History, Medications, Allergies, and Review of Systems are unchanged from previous evaluation on 03/31/17.   For VQI Use Only   PRE-ADM LIVING: Home  AMB STATUS: Ambulatory   Physical Examination   Vitals:   05/19/17 1513 05/19/17 1514  BP: (!) 150/90 (!) 148/91  Pulse: (!) 59   Resp: 20   SpO2: 98%   Weight: 158 lb (71.7 kg)   Height: 5' 10.5" (1.791 m)     Vascular Vessel Right Left  Aorta Not palpable N/A  Femoral Palpable Palpable     Gastrointestinal Palpable AT pulses bilaterally soft, non-distended, non-tender to palpation      Radiology   CLINICAL DATA:  Abdominal aortic aneurysm, status post stent graft repair to 01/2018  EXAM: CTA ABDOMEN AND PELVIS WITH CONTRAST  TECHNIQUE: Multidetector CT imaging of the abdomen and pelvis was performed using the standard protocol during bolus administration of intravenous contrast. Multiplanar reconstructed images and MIPs were obtained and reviewed to evaluate the vascular anatomy.  CONTRAST:  75 cc Isovue 370  COMPARISON:  03/27/2017  FINDINGS: VASCULAR  Aorta: Patient is now status post stent graft repair of the infrarenal abdominal aortic aneurysm. Stent graft extends from below the  renal arteries into the right common iliac artery and the left external iliac artery. Stent graft lumen appears patent. Native aneurysm sac is thrombosed. Small endoleak noted of the distal aneurysm sac just above the bifurcation close to the IMA origin. This may be secondary to the IMA or small L4 level lumbar arteries. This appears to be a small type 2 endoleak. Native aneurysm sac diameter slightly smaller measuring 4.6 cm, previously 5.3 cm.  Celiac: Stable atherosclerotic origin with mild ostial stenosis estimated at approximately 50%.  SMA: Atherosclerotic origin without significant stenosis or occlusion  Renals: Atherosclerotic origins without significant stenosis or occlusion  IMA: Very small in caliber but remains patent off of the distal aspect of the aneurysm suspect related to SMA collateral pathways.  Inflow: Atherosclerotic disease of the iliac vasculature. Iliac vessels are patent. No iliac inflow disease or occlusion. Coil embolization noted of the left internal iliac aneurysm. Stable small right internal iliac aneurysm is partially thrombosed measuring 1.5 cm.  Proximal Outflow: Visualized common femoral, proximal profunda femoral, proximal superficial femoral arteries are all patent.  Veins: No obvious veno-occlusive process on the delayed imaging.  Review of the MIP images confirms the above findings.  NON-VASCULAR  Lower chest: No acute abnormality.  Hepatobiliary: Stable small hypodense left hepatic cyst measures 15 mm. Other stable small hepatic hypodensities, suspect additional small hepatic cysts. No biliary obstruction or dilatation. Gallbladder unremarkable. Common bile duct is nondilated.  Pancreas: Unremarkable. No pancreatic ductal dilatation or surrounding inflammatory changes.  Spleen: Normal in size  without focal abnormality.  Adrenals/Urinary Tract: Normal adrenal glands. Kidneys demonstrate normal enhancement and  excretion. No renal obstruction or hydronephrosis. Scattered hypodense renal cortical cysts and left parapelvic cysts as before. No hydroureter, obstructing ureteral calculus, or definite bladder abnormality.  Stomach/Bowel: Negative for bowel obstruction, significant dilatation, ileus, free air. Exam of the bowel is limited without oral contrast for a CTA exam. Distal colonic diverticulosis noted. No fluid collection or abscess. No acute inflammatory process. Appendix not visualized with certainty. Moderate retained stool throughout the colon.  Lymphatic: No adenopathy.  Reproductive: Prostate is unremarkable.  Other: No abdominal wall hernia or abnormality. No abdominopelvic ascites.  Musculoskeletal: Degenerative changes noted spine. No acute osseous finding.  IMPRESSION: VASCULAR  Interval stent graft repair of the infrarenal abdominal aortic aneurysm with slight decrease in the native aneurysm sac diameter measurements now measuring 4.6 cm, previously 5.3 cm.  Evidence of a small type 2 endoleak of the distal aneurysm sac at the level of the IMA and L4 lumbar arteries as detailed above.  No other acute vascular finding.  NON-VASCULAR  No other acute intra-abdominal or pelvic finding.   Electronically Signed   By: Jerilynn Mages.  Shick M.D.   On: 05/12/2017 14:57   Medical Decision Making   Charles Stephenson is a 83 y.o. male who presents s/p EVAR with coil embolization of left internal iliac artery aneurysm.     Pt is asymptomatic with decrease in sac size from 5.3 to 4.6cm.  I discussed with the patient the importance of surveillance of the endograft.  The next endograft duplex will be scheduled for 6 months.  Encouraged active lifestyle with continued walking program   Dagoberto Ligas, PA-C [facility] [place] Office: [phone]

## 2017-05-20 ENCOUNTER — Other Ambulatory Visit: Payer: Self-pay

## 2017-05-20 DIAGNOSIS — I714 Abdominal aortic aneurysm, without rupture, unspecified: Secondary | ICD-10-CM

## 2017-06-05 ENCOUNTER — Telehealth: Payer: Self-pay | Admitting: Family Medicine

## 2017-06-05 NOTE — Telephone Encounter (Signed)
Copied from [place] [id number]. Topic: Quick Communication - See Telephone Encounter >> Jun 05, 2017 12:31 PM Robina Ade, Helene Kelp D wrote: CRM for notification. See Telephone encounter for: 06/05/17. Patient would like to talk to Dr. Yong Channel about this CPE. He had things going on and is not sure if he can have is done or he should wait. Please call patient back, thanks.

## 2017-06-05 NOTE — Telephone Encounter (Signed)
Spoke with patient who stated that because he had an aortic aneurysm repair done April 13, 2017 and had extensive blood work and Ct imaging done he was requesting to delay his physical until May. I told him that would be fine but if he had issues before then he could call us and we would be happy to see him.

## 2017-06-09 DIAGNOSIS — L738 Other specified follicular disorders: Secondary | ICD-10-CM | POA: Diagnosis not present

## 2017-06-09 DIAGNOSIS — L218 Other seborrheic dermatitis: Secondary | ICD-10-CM | POA: Diagnosis not present

## 2017-06-09 DIAGNOSIS — D225 Melanocytic nevi of trunk: Secondary | ICD-10-CM | POA: Diagnosis not present

## 2017-06-09 DIAGNOSIS — L570 Actinic keratosis: Secondary | ICD-10-CM | POA: Diagnosis not present

## 2017-06-09 DIAGNOSIS — L821 Other seborrheic keratosis: Secondary | ICD-10-CM | POA: Diagnosis not present

## 2017-06-09 DIAGNOSIS — L853 Xerosis cutis: Secondary | ICD-10-CM | POA: Diagnosis not present

## 2017-06-09 DIAGNOSIS — L814 Other melanin hyperpigmentation: Secondary | ICD-10-CM | POA: Diagnosis not present

## 2017-06-09 DIAGNOSIS — D1801 Hemangioma of skin and subcutaneous tissue: Secondary | ICD-10-CM | POA: Diagnosis not present

## 2017-06-09 DIAGNOSIS — Z85828 Personal history of other malignant neoplasm of skin: Secondary | ICD-10-CM | POA: Diagnosis not present

## 2017-06-16 ENCOUNTER — Encounter: Payer: PPO | Admitting: Family Medicine

## 2017-06-16 DIAGNOSIS — H35363 Drusen (degenerative) of macula, bilateral: Secondary | ICD-10-CM | POA: Diagnosis not present

## 2017-06-16 DIAGNOSIS — H527 Unspecified disorder of refraction: Secondary | ICD-10-CM | POA: Diagnosis not present

## 2017-06-16 DIAGNOSIS — H43813 Vitreous degeneration, bilateral: Secondary | ICD-10-CM | POA: Diagnosis not present

## 2017-06-16 DIAGNOSIS — Z961 Presence of intraocular lens: Secondary | ICD-10-CM | POA: Diagnosis not present

## 2017-08-07 ENCOUNTER — Ambulatory Visit (INDEPENDENT_AMBULATORY_CARE_PROVIDER_SITE_OTHER): Payer: PPO | Admitting: Family Medicine

## 2017-08-07 ENCOUNTER — Encounter: Payer: Self-pay | Admitting: Family Medicine

## 2017-08-07 VITALS — BP 120/84 | HR 50 | Temp 97.8°F | Ht 70.5 in | Wt 155.4 lb

## 2017-08-07 DIAGNOSIS — Z0000 Encounter for general adult medical examination without abnormal findings: Secondary | ICD-10-CM

## 2017-08-07 DIAGNOSIS — I714 Abdominal aortic aneurysm, without rupture, unspecified: Secondary | ICD-10-CM

## 2017-08-07 DIAGNOSIS — E785 Hyperlipidemia, unspecified: Secondary | ICD-10-CM | POA: Diagnosis not present

## 2017-08-07 DIAGNOSIS — Z23 Encounter for immunization: Secondary | ICD-10-CM | POA: Diagnosis not present

## 2017-08-07 LAB — LIPID PANEL
Cholesterol: 157 mg/dL (ref 0–200)
HDL: 58.2 mg/dL (ref >39.00)
LDL Cholesterol: 90 mg/dL (ref 0–99)
NonHDL: 98.6
Total CHOL/HDL Ratio: 3
Triglycerides: 45 mg/dL (ref 0.0–149.0)
VLDL: 9 mg/dL (ref 0.0–40.0)

## 2017-08-07 LAB — CBC
HCT: 41.9 % (ref 39.0–52.0)
Hemoglobin: 14 g/dL (ref 13.0–17.0)
MCHC: 33.5 g/dL (ref 30.0–36.0)
MCV: 90 fl (ref 78.0–100.0)
Platelets: 185 10*3/uL (ref 150.0–400.0)
RBC: 4.66 Mil/uL (ref 4.22–5.81)
RDW: 14.4 % (ref 11.5–15.5)
WBC: 5.5 10*3/uL (ref 4.0–10.5)

## 2017-08-07 LAB — COMPREHENSIVE METABOLIC PANEL
ALT: 17 U/L (ref 0–53)
AST: 23 U/L (ref 0–37)
Albumin: 4.3 g/dL (ref 3.5–5.2)
Alkaline Phosphatase: 62 U/L (ref 39–117)
BUN: 14 mg/dL (ref 6–23)
CO2: 29 mEq/L (ref 19–32)
Calcium: 9.7 mg/dL (ref 8.4–10.5)
Chloride: 101 mEq/L (ref 96–112)
Creatinine, Ser: 0.94 mg/dL (ref 0.40–1.50)
GFR: 81.88 mL/min (ref >60.00)
Glucose, Bld: 88 mg/dL (ref 70–99)
Potassium: 4.4 mEq/L (ref 3.5–5.1)
Sodium: 137 mEq/L (ref 135–145)
Total Bilirubin: 0.9 mg/dL (ref 0.2–1.2)
Total Protein: 7 g/dL (ref 6.0–8.3)

## 2017-08-07 NOTE — Progress Notes (Signed)
Please let us know by responding to this when you get this mychart message and let us know if you understand the results/directions  Your CBC was normal (blood counts, infection fighting cells, platelets). Your CMET was normal (kidney, liver, and electrolytes, blood sugar).  Your cholesterol looked great

## 2017-08-07 NOTE — Progress Notes (Signed)
Phone: [phone]  Subjective:  Patient presents today for their annual physical. Chief complaint-noted.   See problem oriented charting- ROS- full  review of systems was completed and negative per full ROS sheet  The following were reviewed and entered/updated in epic: Past Medical History:  Diagnosis Date  . AAA (abdominal aortic aneurysm) ([place])   . Basal cell carcinoma   . Diverticulitis    3x through 18 months around 2012  . Hepatitis   . History of alcohol abuse    sober since age 22  . History of vertigo    december 2007   . Hypercholesterolemia   . Seasonal allergies    no rx   Patient Active Problem List   Diagnosis Date Noted  . History of basal cell carcinoma of skin 12/10/2015    Priority: Medium  . Hyperlipidemia 03/12/2012    Priority: Medium  . AAA (abdominal aortic aneurysm) ([place]) 03/12/2012    Priority: Medium  . Vertigo 05/29/2016    Priority: Low  . Colon cancer screening 04/21/2016    Priority: Low  . Seasonal allergies     Priority: Low  . Chest pain 03/12/2012    Priority: Low   Past Surgical History:  Procedure Laterality Date  . ABDOMINAL AORTIC ENDOVASCULAR STENT GRAFT  04/13/2017   w/EMBOLIZATION LEFT HYPOGASTRIC USING INTERLOCK-35 DETACHABLE COIL/notes 04/13/2017  . ABDOMINAL AORTIC ENDOVASCULAR STENT GRAFT N/A 04/13/2017   Procedure: ABDOMINAL AORTIC ENDOVASCULAR STENT GRAFT;  Surgeon: Rosetta Posner, MD;  Location: [facility];  Service: Vascular;  Laterality: N/A;  . BACK SURGERY    . BASAL CELL CARCINOMA EXCISION  X~ 9   "chest, back, left leg" (04/13/2017)  . CARDIOVASCULAR STRESS TEST  Jan. 14,2014  . CATARACT EXTRACTION W/ INTRAOCULAR LENS  IMPLANT, BILATERAL Bilateral    L pupil larger after lens implant  . COLONOSCOPY    . EMBOLIZATION N/A 04/13/2017   Procedure: EMBOLIZATION LEFT HYPOGASTRIC USING INTERLOCK-35 DETACHABLE COIL;  Surgeon: Rosetta Posner, MD;  Location: [facility];  Service: Vascular;  Laterality: N/A;  . INGUINAL HERNIA  REPAIR Right 2005  . MOHS SURGERY  09/2014; ~ 2014   BCC; nose and left ear  . POSTERIOR FUSION LUMBAR SPINE  10/15/1982   "the 2 lowest discs fused"    Family History  Problem Relation Age of Onset  . AAA (abdominal aortic aneurysm) Father        sounds like died of DIC- otherwise healthy  . Peripheral vascular disease Father        Aneurysm- Aorta  . AAA (abdominal aortic aneurysm) Brother        states multiple medicle issues but died of pulmonary fibrosis- was told smoking related  . Heart disease Brother 85       Before age 40  . Heart attack Brother        x2  . Alcohol abuse Brother        sober 25 years when he died  . Prostate cancer Brother   . Cancer Mother        Stomach    Medications- reviewed and updated Current Outpatient Medications  Medication Sig Dispense Refill  . atorvastatin (LIPITOR) 20 MG tablet Take 1 tablet (20 mg total) by mouth daily at 6 PM. 91 tablet 3  . Cholecalciferol (VITAMIN D3) 2000 units capsule Take 4,000 Units by mouth daily.     . Multiple Vitamins-Minerals (CENTRUM SILVER PO) Take 1 tablet by mouth daily.     Marland Kitchen  PSYLLIUM FIBER PO Take 6-8 tablets by mouth at bedtime.     No current facility-administered medications for this visit.     Allergies-reviewed and updated No Known Allergies  Social History   Social History Narrative   Married to wife Crimson Beer (patient of mine). 2 kids. No grandkids. No pets.       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 Oropharynx  normal Neck: no thyromegaly CV: Bradycardic but regular-rate 60 on my exam- no murmurs rubs or gallops Lungs: CTAB no crackles, wheeze, rhonchi Abdomen: soft/nontender/nondistended/normal bowel sounds. No rebound or guarding.  Ext: no edema Skin: warm, dry Neuro: grossly normal, moves all extremities, PERRLA  Assessment/Plan:  83 y.o. male presenting for annual physical.  Health Maintenance counseling: 1. Anticipatory guidance: Patient counseled regarding regular dental exams -q6 months, eye exams -yearly, wearing seatbelts.  2. Risk factor reduction:  Advised patient of need for regular exercise and diet rich and fruits and vegetables to reduce risk of heart attack and stroke. Exercise- hasnt been able to play golf but has been active golfing still. Diet-eating reasonable diet- weight down a few lbs form last year. He would like to gain a few lbs.  Wt Readings from Last 3 Encounters:  08/07/17 155 lb 6.4 oz (70.5 kg)  05/19/17 158 lb (71.7 kg)  04/13/17 158 lb 9.6 oz (71.9 kg)  3. Immunizations/screenings/ancillary studies. - We discussed shingrix availability issues  as well as coverage issues (part D medicare)- I recommended that patient get vaccine at the pharmacy  Immunization History  Administered Date(s) Administered  . Influenza, High Dose Seasonal PF 12/10/2015, 12/05/2016  . Influenza-Unspecified 12/01/2012  . Pneumococcal Conjugate-13 05/29/2016   Health Maintenance Due  Topic Date Due  . TETANUS/TDAP - Informed patient to have it done at his pharmacy. 09/10/1953  . PNA vac Low Risk Adult (2 of 2 - PPSV23) - Today at office visit 05/29/2017   4. Prostate cancer screening- he has passed the age based screening  5. Colon cancer screening - 03/25/2016 with Dr. Oletta Lamas equal GI.  Dr. Oletta Lamas advised stool cards in 5 years with [place] per patient report. Will plan in 2023 6. Skin cancer screening-[facility] Dr. Derrel Nip at least yearly. advised regular sunscreen use.  Denies worrisome, changing, or new skin lesions.   Status of chronic or acute concerns   Hyperlipidemia Hyperlipidemia-remains on atorvastatin 20 mg-last lipids with LDL under 100  AAA (abdominal aortic aneurysm) ([place]) AAA- follows up with vascular surgery- repair in February 2019.  Saw them in March- follow up scans have looked good. Plan is follow-up every 6 months   Future Appointments  Date Time Provider [place]  11/24/2017  8:00 AM [facility] 3 - EM [facility]  11/24/2017  8:45 AM Early, Arvilla Meres, MD [facility]   No follow-ups on file.  Lab/Order associations: Preventative health care - Plan: CBC, Comprehensive metabolic panel, Lipid panel  Hyperlipidemia, unspecified hyperlipidemia type - Plan: CBC, Comprehensive metabolic panel, Lipid panel  Need for prophylactic vaccination against Streptococcus pneumoniae (pneumococcus) - Plan: Pneumococcal polysaccharide vaccine 23-valent greater than or equal to 2yo subcutaneous/IM  Abdominal aortic aneurysm (AAA) without rupture (HCC)  No orders of the defined types were placed in this encounter.   Return precautions advised.  Garret Reddish, MD

## 2017-08-07 NOTE — Patient Instructions (Addendum)
Health Maintenance Due  Topic Date Due  . TETANUS/TDAP - Informed patient to have it done at his pharmacy. 09/10/1953  . PNA vac Low Risk Adult (2 of 2 - PPSV23) - Today at office visit 05/29/2017   Please check with your pharmacy to see if they have the shingrix vaccine. If they do- please get this immunization and update [place] by phone call or mychart with dates you receive the vaccine   I would also like for you to sign up for an annual wellness visit with one of our nurses, Cassie or Manuela Schwartz, who both specialize in the annual wellness visit. This is a free benefit under medicare that may help [place] find additional ways to help you. Some highlights are reviewing medications,lifestyle, and doing a dementia screen.  Would be okay to wait on this until after you move/things slow down.  This would be a good chance to recheck the weight

## 2017-08-07 NOTE — Assessment & Plan Note (Signed)
Hyperlipidemia-remains on atorvastatin 20 mg-last lipids with LDL under 100

## 2017-08-07 NOTE — Assessment & Plan Note (Signed)
AAA- follows up with vascular surgery- repair in February 2019.  Saw them in March- follow up scans have looked good. Plan is follow-up every 6 months

## 2017-08-20 ENCOUNTER — Encounter (HOSPITAL_COMMUNITY): Payer: Self-pay

## 2017-08-20 ENCOUNTER — Emergency Department (HOSPITAL_COMMUNITY)
Admission: EM | Admit: 2017-08-20 | Discharge: 2017-08-20 | Disposition: A | Discharge status: Home / Self Care | Payer: PPO | Attending: Emergency Medicine | Admitting: Emergency Medicine

## 2017-08-20 DIAGNOSIS — S99922A Unspecified injury of left foot, initial encounter: Secondary | ICD-10-CM | POA: Insufficient documentation

## 2017-08-20 DIAGNOSIS — Z85828 Personal history of other malignant neoplasm of skin: Secondary | ICD-10-CM | POA: Insufficient documentation

## 2017-08-20 DIAGNOSIS — Z79899 Other long term (current) drug therapy: Secondary | ICD-10-CM | POA: Insufficient documentation

## 2017-08-20 DIAGNOSIS — Y939 Activity, unspecified: Secondary | ICD-10-CM | POA: Diagnosis not present

## 2017-08-20 DIAGNOSIS — Y999 Unspecified external cause status: Secondary | ICD-10-CM | POA: Diagnosis not present

## 2017-08-20 DIAGNOSIS — S59901A Unspecified injury of right elbow, initial encounter: Secondary | ICD-10-CM | POA: Diagnosis present

## 2017-08-20 DIAGNOSIS — Z23 Encounter for immunization: Secondary | ICD-10-CM | POA: Diagnosis not present

## 2017-08-20 DIAGNOSIS — Z2914 Encounter for prophylactic rabies immune globin: Secondary | ICD-10-CM | POA: Insufficient documentation

## 2017-08-20 DIAGNOSIS — Y92014 Private driveway to single-family (private) house as the place of occurrence of the external cause: Secondary | ICD-10-CM | POA: Diagnosis not present

## 2017-08-20 DIAGNOSIS — W5681XA Bitten by other nonvenomous marine animals, initial encounter: Secondary | ICD-10-CM | POA: Insufficient documentation

## 2017-08-20 DIAGNOSIS — S51051A Open bite, right elbow, initial encounter: Secondary | ICD-10-CM | POA: Diagnosis not present

## 2017-08-20 DIAGNOSIS — T148XXA Other injury of unspecified body region, initial encounter: Secondary | ICD-10-CM

## 2017-08-20 DIAGNOSIS — S51011A Laceration without foreign body of right elbow, initial encounter: Secondary | ICD-10-CM | POA: Diagnosis not present

## 2017-08-20 MED ORDER — RABIES VACCINE, PCEC IM SUSR
1.0000 mL | Freq: Once | INTRAMUSCULAR | Status: AC
  Administered 2017-08-20: 1 mL via INTRAMUSCULAR
  Filled 2017-08-20: qty 1

## 2017-08-20 MED ORDER — TETANUS-DIPHTH-ACELL PERTUSSIS 5-2.5-18.5 LF-MCG/0.5 IM SUSP
0.5000 mL | Freq: Once | INTRAMUSCULAR | Status: AC
  Administered 2017-08-20: 0.5 mL via INTRAMUSCULAR
  Filled 2017-08-20: qty 0.5

## 2017-08-20 MED ORDER — AMOXICILLIN-POT CLAVULANATE 875-125 MG PO TABS: 1.0000 | ORAL_TABLET | Freq: Two times a day (BID) | ORAL | 0 refills | Status: DC

## 2017-08-20 MED ORDER — IBUPROFEN 600 MG PO TABS: 600.0000 mg | ORAL_TABLET | Freq: Four times a day (QID) | ORAL | 0 refills | Status: DC | PRN

## 2017-08-20 MED ORDER — RABIES IMMUNE GLOBULIN 150 UNIT/ML IM INJ
20.0000 [IU]/kg | INJECTION | Freq: Once | INTRAMUSCULAR | Status: AC
  Administered 2017-08-20: 1425 [IU] via INTRAMUSCULAR
  Filled 2017-08-20: qty 9.5

## 2017-08-20 MED ORDER — AMOXICILLIN-POT CLAVULANATE 875-125 MG PO TABS
1.0000 | ORAL_TABLET | Freq: Once | ORAL | Status: AC
  Administered 2017-08-20: 1 via ORAL
  Filled 2017-08-20: qty 1

## 2017-08-20 NOTE — ED Triage Notes (Signed)
Pt was attacked by a wild animal with a fluffy tail when he walked out of his back door, he has bit marks on his left ankle, right hand, and right elbow, he states that he fell when this happened and unsure if the area on his elbow is a bite or from the gravel, he also states that his hips and back are sore from the fall

## 2017-08-20 NOTE — ED Provider Notes (Signed)
[facility] DEPT Provider Note   CSN: [id number] Arrival date & time: 08/20/17  1939     History   Chief Complaint Chief Complaint  Patient presents with  . Animal Bite    HPI Charles Stephenson is a 83 y.o. male.  HPI  83 year old male who was attacked by unknown animal when he was walking up his driveway.  He states it had a fluffy tail, it jumped up onto him and attacked him as he fell to the ground it continued to attack him until he was able to kick it away and run for safety.  He states that he was scratched and bit on his left foot, his right and left hand and his right elbow.  When he fell he did land on his lower back and left hip but was able to get up and walk with only a mild amount of discomfort.  No head injury.  This occurred just prior to arrival, symptoms are persistent, moderate, worse with palpation.  Minimal bleeding.  He is unsure whether this was a Fox or raccoon or some other animal.  Past Medical History:  Diagnosis Date  . AAA (abdominal aortic aneurysm) ([place])   . Basal cell carcinoma   . Diverticulitis    3x through 18 months around 2012  . Hepatitis   . History of alcohol abuse    sober since age 78  . History of vertigo    december 2007   . Hypercholesterolemia   . Seasonal allergies    no rx    Patient Active Problem List   Diagnosis Date Noted  . Vertigo 05/29/2016  . Colon cancer screening 04/21/2016  . History of basal cell carcinoma of skin 12/10/2015  . Seasonal allergies   . Chest pain 03/12/2012  . Hyperlipidemia 03/12/2012  . AAA (abdominal aortic aneurysm) ([place]) 03/12/2012    Past Surgical History:  Procedure Laterality Date  . ABDOMINAL AORTIC ENDOVASCULAR STENT GRAFT  04/13/2017   w/EMBOLIZATION LEFT HYPOGASTRIC USING INTERLOCK-35 DETACHABLE COIL/notes 04/13/2017  . ABDOMINAL AORTIC ENDOVASCULAR STENT GRAFT N/A 04/13/2017   Procedure: ABDOMINAL AORTIC ENDOVASCULAR STENT GRAFT;  Surgeon:  Rosetta Posner, MD;  Location: [facility];  Service: Vascular;  Laterality: N/A;  . BACK SURGERY    . BASAL CELL CARCINOMA EXCISION  X~ 9   "chest, back, left leg" (04/13/2017)  . CARDIOVASCULAR STRESS TEST  Jan. 14,2014  . CATARACT EXTRACTION W/ INTRAOCULAR LENS  IMPLANT, BILATERAL Bilateral    L pupil larger after lens implant  . COLONOSCOPY    . EMBOLIZATION N/A 04/13/2017   Procedure: EMBOLIZATION LEFT HYPOGASTRIC USING INTERLOCK-35 DETACHABLE COIL;  Surgeon: Rosetta Posner, MD;  Location: [facility];  Service: Vascular;  Laterality: N/A;  . INGUINAL HERNIA REPAIR Right 2005  . MOHS SURGERY  09/2014; ~ 2014   BCC; nose and left ear  . POSTERIOR FUSION LUMBAR SPINE  10/15/1982   "the 2 lowest discs fused"        Home Medications    Prior to Admission medications   Medication Sig Start Date End Date Taking? Authorizing Provider  atorvastatin (LIPITOR) 20 MG tablet Take 1 tablet (20 mg total) by mouth daily at 6 PM. 01/26/17  Yes Marin Olp, MD  Cholecalciferol (VITAMIN D3) 2000 units capsule Take 4,000 Units by mouth daily.    Yes [provider]  Multiple Vitamins-Minerals (CENTRUM SILVER PO) Take 1 tablet by mouth daily.    Yes [provider]  PSYLLIUM  FIBER PO Take 6-8 tablets by mouth at bedtime.   Yes [provider]  amoxicillin-clavulanate (AUGMENTIN) 875-125 MG tablet Take 1 tablet by mouth every 12 (twelve) hours. 08/20/17   Noemi Chapel, MD  ibuprofen (ADVIL,MOTRIN) 600 MG tablet Take 1 tablet (600 mg total) by mouth every 6 (six) hours as needed. 08/20/17   Noemi Chapel, MD    Family History Family History  Problem Relation Age of Onset  . AAA (abdominal aortic aneurysm) Father        sounds like died of DIC- otherwise healthy  . Peripheral vascular disease Father        Aneurysm- Aorta  . AAA (abdominal aortic aneurysm) Brother        states multiple medicle issues but died of pulmonary fibrosis- was told smoking related  . Heart disease  Brother 8       Before age 31  . Heart attack Brother        x2  . Alcohol abuse Brother        sober 25 years when he died  . Prostate cancer Brother   . Cancer Mother        Stomach    Social History Social History   Tobacco Use  . Smoking status: Never Smoker  . Smokeless tobacco: Never Used  Substance Use Topics  . Alcohol use: No    Comment: 04/13/2017 "recovering alcoholic; 196/2229"  . Drug use: No     Allergies   Patient has no known allergies.   Review of Systems Review of Systems  All other systems reviewed and are negative.   Physical Exam Updated Vital Signs BP (!) 168/84 (BP Location: Left Arm)   Pulse 62   Temp 98 F (36.7 C) (Oral)   Resp 18   SpO2 100%   Physical Exam  Constitutional: He appears well-developed and well-nourished. No distress.  HENT:  Head: Normocephalic and atraumatic.  Mouth/Throat: Oropharynx is clear and moist. No oropharyngeal exudate.  Eyes: Pupils are equal, round, and reactive to light. Conjunctivae and EOM are normal. Right eye exhibits no discharge. Left eye exhibits no discharge. No scleral icterus.  Neck: Normal range of motion. Neck supple. No JVD present. No thyromegaly present.  Cardiovascular: Normal rate, regular rhythm, normal heart sounds and intact distal pulses. Exam reveals no gallop and no friction rub.  No murmur heard. Pulmonary/Chest: Effort normal and breath sounds normal. No respiratory distress. He has no wheezes. He has no rales.  Abdominal: Soft. Bowel sounds are normal. He exhibits no distension and no mass. There is no tenderness.  Musculoskeletal: Normal range of motion. He exhibits no edema or tenderness.  Full range of motion of all 4 extremities, all joints are supple and compartments are soft, injuries as noted in skin exam  Lymphadenopathy:    He has no cervical adenopathy.  Neurological: He is alert. Coordination normal.  Skin: Skin is warm and dry. No rash noted. No erythema.  Skin  tears over the right elbow, abrasions noted to the left foot, left hand and right hand  Psychiatric: He has a normal mood and affect. His behavior is normal.  Nursing note and vitals reviewed.    ED Treatments / Results  Labs (all labs ordered are listed, but only abnormal results are displayed) Labs Reviewed - No data to display  EKG None  Radiology No results found.  Procedures Procedures (including critical care time)  Medications Ordered in ED Medications  rabies immune globulin (HYPERAB/KEDRAB) injection 1,425  Units (1,425 Units Intramuscular Given 08/20/17 2116)  rabies vaccine (RABAVERT) injection 1 mL (1 mL Intramuscular Given 08/20/17 2116)  Tdap (BOOSTRIX) injection 0.5 mL (0.5 mLs Intramuscular Given 08/20/17 2118)  amoxicillin-clavulanate (AUGMENTIN) 875-125 MG per tablet 1 tablet (1 tablet Oral Given 08/20/17 2119)     Initial Impression / Assessment and Plan / ED Course  I have reviewed the triage vital signs and the nursing notes.  Pertinent labs & imaging results that were available during my care of the patient were reviewed by me and considered in my medical decision making (see chart for details).    Very potentially has had a rabies exposure.  Will pursue with rabies immunoglobulin, rabies vaccine, tetanus update and antibiotics.  Local wound care, dressings, the patient will be instructed on ongoing treatment for finishing the vaccination series.  He expressed his understanding.  There does not appear to be any broken bones from the fall, his wounds are otherwise clean and will be irrigated copiously and covered with sterile dressings.  Stable for discharge  Final Clinical Impressions(s) / ED Diagnoses   Final diagnoses:  Animal bite    ED Discharge Orders        Ordered    ibuprofen (ADVIL,MOTRIN) 600 MG tablet  Every 6 hours PRN     08/20/17 2114    amoxicillin-clavulanate (AUGMENTIN) 875-125 MG tablet  Every 12 hours     08/20/17 2114         Noemi Chapel, MD 08/20/17 2122

## 2017-08-20 NOTE — Discharge Instructions (Signed)
You will need to get your neck shots for your rabies exposure on day 3, day 7 and day 14.  Please read the attached instructions Ibuprofen for pain, keep the wounds clean and dry, apply topical antibiotic ointment whenever you change your dressing every 12 hours Augmentin twice daily for 7 days seek medical attention for severe or worsening symptoms.

## 2017-08-22 ENCOUNTER — Ambulatory Visit (HOSPITAL_COMMUNITY)
Admission: EM | Admit: 2017-08-22 | Discharge: 2017-08-22 | Disposition: A | Discharge status: Home / Self Care | Payer: PPO

## 2017-08-22 NOTE — ED Notes (Signed)
Bed: UC01 Expected date:  Expected time:  Means of arrival:  Comments: Appts

## 2017-08-23 ENCOUNTER — Ambulatory Visit (HOSPITAL_COMMUNITY)
Admission: EM | Admit: 2017-08-23 | Discharge: 2017-08-23 | Disposition: A | Discharge status: Home / Self Care | Payer: PPO | Attending: Family Medicine | Admitting: Family Medicine

## 2017-08-23 ENCOUNTER — Encounter (HOSPITAL_COMMUNITY): Payer: Self-pay | Admitting: *Deleted

## 2017-08-23 DIAGNOSIS — Z23 Encounter for immunization: Secondary | ICD-10-CM | POA: Diagnosis not present

## 2017-08-23 DIAGNOSIS — Z203 Contact with and (suspected) exposure to rabies: Secondary | ICD-10-CM

## 2017-08-23 MED ORDER — RABIES VACCINE, PCEC IM SUSR
1.0000 mL | Freq: Once | INTRAMUSCULAR | Status: AC
  Administered 2017-08-23: 1 mL via INTRAMUSCULAR

## 2017-08-23 MED ORDER — RABIES VACCINE, PCEC IM SUSR
INTRAMUSCULAR | Status: AC
  Filled 2017-08-23: qty 1

## 2017-08-23 NOTE — Discharge Instructions (Signed)
Follow-up on 08/27/17 for next injection, followed by final injection to be administered 09/03/17.

## 2017-08-27 DIAGNOSIS — Z23 Encounter for immunization: Secondary | ICD-10-CM | POA: Diagnosis not present

## 2017-08-27 DIAGNOSIS — Z2914 Encounter for prophylactic rabies immune globin: Secondary | ICD-10-CM | POA: Diagnosis not present

## 2017-08-27 DIAGNOSIS — R001 Bradycardia, unspecified: Secondary | ICD-10-CM | POA: Diagnosis not present

## 2017-08-27 DIAGNOSIS — T148XXA Other injury of unspecified body region, initial encounter: Secondary | ICD-10-CM | POA: Diagnosis not present

## 2017-09-03 ENCOUNTER — Ambulatory Visit (HOSPITAL_COMMUNITY)
Admission: EM | Admit: 2017-09-03 | Discharge: 2017-09-03 | Disposition: A | Discharge status: Home / Self Care | Payer: PPO | Attending: Family Medicine | Admitting: Family Medicine

## 2017-09-03 DIAGNOSIS — W5581XA Bitten by other mammals, initial encounter: Secondary | ICD-10-CM

## 2017-09-03 DIAGNOSIS — Z203 Contact with and (suspected) exposure to rabies: Secondary | ICD-10-CM | POA: Diagnosis not present

## 2017-09-03 DIAGNOSIS — Z23 Encounter for immunization: Secondary | ICD-10-CM | POA: Diagnosis not present

## 2017-09-03 MED ORDER — RABIES VACCINE, PCEC IM SUSR
1.0000 mL | Freq: Once | INTRAMUSCULAR | Status: AC
  Administered 2017-09-03: 1 mL via INTRAMUSCULAR

## 2017-09-03 MED ORDER — RABIES VACCINE, PCEC IM SUSR
INTRAMUSCULAR | Status: AC
  Filled 2017-09-03: qty 1

## 2017-09-03 NOTE — Discharge Instructions (Signed)
Your rabies vaccine series is complete.

## 2017-09-03 NOTE — ED Notes (Signed)
Bed: UC01 Expected date: 09/03/17 Expected time:  Means of arrival:  Comments: HELD: For Appts

## 2017-09-04 DIAGNOSIS — M545 Low back pain: Secondary | ICD-10-CM | POA: Diagnosis not present

## 2017-09-04 DIAGNOSIS — M5417 Radiculopathy, lumbosacral region: Secondary | ICD-10-CM | POA: Insufficient documentation

## 2017-09-08 DIAGNOSIS — M5417 Radiculopathy, lumbosacral region: Secondary | ICD-10-CM | POA: Diagnosis not present

## 2017-11-18 ENCOUNTER — Telehealth: Payer: Self-pay | Admitting: Family Medicine

## 2017-11-18 NOTE — Telephone Encounter (Signed)
Patient came in office in reference to needing a CPE for him and his wife "Charles Stephenson" for them to go into [employer]. Both patients had CPEs in June of this year. Patient dropped off paperwork for the retirement home that needs to be filled out for both patients by 01/01/18. I informed patient that insurance only pays for one CPE a year and he stated he was fine with paying for the extra one in order for the paperwork to be filled out. Paperwork is in [name] pick up folder for both patients. Please advise if patient needs an appt for paperwork to be filled out. OK to leave vm on both patients cell.

## 2017-11-21 DIAGNOSIS — M5417 Radiculopathy, lumbosacral region: Secondary | ICD-10-CM | POA: Diagnosis not present

## 2017-11-24 ENCOUNTER — Other Ambulatory Visit: Payer: Self-pay

## 2017-11-24 ENCOUNTER — Encounter: Payer: Self-pay | Admitting: Vascular Surgery

## 2017-11-24 ENCOUNTER — Ambulatory Visit (INDEPENDENT_AMBULATORY_CARE_PROVIDER_SITE_OTHER): Payer: PPO | Admitting: Vascular Surgery

## 2017-11-24 ENCOUNTER — Ambulatory Visit (HOSPITAL_COMMUNITY)
Admission: RE | Admit: 2017-11-24 | Discharge: 2017-11-24 | Disposition: A | Discharge status: Home / Self Care | Payer: PPO | Source: Ambulatory Visit | Attending: Vascular Surgery | Admitting: Vascular Surgery

## 2017-11-24 VITALS — BP 162/82 | HR 48 | Resp 19 | Ht 70.5 in | Wt 154.0 lb

## 2017-11-24 DIAGNOSIS — I714 Abdominal aortic aneurysm, without rupture, unspecified: Secondary | ICD-10-CM

## 2017-11-24 DIAGNOSIS — Z48812 Encounter for surgical aftercare following surgery on the circulatory system: Secondary | ICD-10-CM | POA: Diagnosis not present

## 2017-11-24 NOTE — Progress Notes (Signed)
[facility]  Patient name: Charles Stephenson MRN: [id number] DOB: October 06, 1934 Sex: male  REASON FOR VISIT: Follow-up stent graft repair abdominal aortic aneurysm  HPI: Charles Stephenson is a 83 y.o. male here today for follow-up.  He continues to do quite well from a general health standpoint.  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  He has no symptoms referable to his aneurysm  Past Medical History:  Diagnosis Date  . AAA (abdominal aortic aneurysm) ([place])   . Basal cell carcinoma   . Diverticulitis    3x through 18 months around 2012  . Hepatitis   . History of alcohol abuse    sober since age 34  . History of vertigo    december 2007   . Hypercholesterolemia   . Seasonal allergies    no rx    Family History  Problem Relation Age of Onset  . AAA (abdominal aortic aneurysm) Father        sounds like died of DIC- otherwise healthy  . Peripheral vascular disease Father        Aneurysm- Aorta  . AAA (abdominal aortic aneurysm) Brother        states multiple medicle issues but died of pulmonary fibrosis- was told smoking related  . Heart disease Brother 75       Before age 9  . Heart attack Brother        x2  . Alcohol abuse Brother        sober 25 years when he died  . Prostate cancer Brother   . Cancer Mother        Stomach    SOCIAL HISTORY: Social History   Tobacco Use  . Smoking status: Never Smoker  . Smokeless tobacco: Never Used  Substance Use Topics  . Alcohol use: No    Comment: 04/13/2017 "recovering alcoholic; 283/6629"    No Known Allergies  Current Outpatient Medications  Medication Sig Dispense Refill  . atorvastatin (LIPITOR) 20 MG tablet Take 1 tablet (20 mg total) by mouth daily at 6 PM. 91 tablet 3  . Cholecalciferol (VITAMIN D3) 2000 units capsule Take 4,000 Units by mouth daily.     . Multiple Vitamins-Minerals (CENTRUM SILVER  PO) Take 1 tablet by mouth daily.     . PSYLLIUM FIBER PO Take 6-8 tablets by mouth at bedtime.    Marland Kitchen amoxicillin-clavulanate (AUGMENTIN) 875-125 MG tablet Take 1 tablet by mouth every 12 (twelve) hours. (Patient not taking: Reported on 11/24/2017) 14 tablet 0  . ibuprofen (ADVIL,MOTRIN) 600 MG tablet Take 1 tablet (600 mg total) by mouth every 6 (six) hours as needed. (Patient not taking: Reported on 11/24/2017) 30 tablet 0   No current facility-administered medications for this visit.     REVIEW OF SYSTEMS:  [X]  denotes positive finding, [ ]  denotes negative finding Cardiac  Comments:  Chest pain or chest pressure:    Shortness of breath upon exertion:    Short of breath when lying flat:    Irregular heart rhythm:        Vascular    Pain in calf, thigh, or hip brought on by ambulation:    Pain in feet at night that wakes you up from your sleep:     Blood clot in your veins:    Leg swelling:           PHYSICAL EXAM: Vitals:   11/24/17 4765 11/24/17 0850  BP: (!) 157/86 (!) 162/82  Pulse: (!) 48   Resp: 19   SpO2: 98%   Weight: 154 lb (69.9 kg)   Height: 5' 10.5" (1.791 m)     GENERAL: The patient is a well-nourished male, in no acute distress. The vital signs are documented above. CARDIOVASCULAR: 2+ radial and 2+ femoral pulses bilaterally.  Patient is very thin and has an easily palpable aneurysm which is nonpulsatile PULMONARY: There is good air exchange  MUSCULOSKELETAL: There are no major deformities or cyanosis. NEUROLOGIC: No focal weakness or paresthesias are detected. SKIN: There are no ulcers or rashes noted. PSYCHIATRIC: The patient has a normal affect.  DATA:  Duplex today shows maximal diameter of his aortic aneurysm sac of 4.9 cm which is down from 5.1  MEDICAL ISSUES: Stable follow-up stent graft repair with no evidence of significant endoleak or sac expansion.  He will continue his full activities without limitation.  He will be seen in our Provider clinic  in 1 year with repeat duplex    Rosetta Posner, MD [facility] Tel [phone] Pager [phone]

## 2017-11-30 NOTE — Telephone Encounter (Signed)
Patient's wife came in to the office and I discussed these forms with her. She states they can not be dated prior to 12/11/2017. Stated we would get them completed and to them around the 11th. She verbalized understanding

## 2017-12-14 ENCOUNTER — Encounter: Payer: Self-pay | Admitting: Family Medicine

## 2017-12-14 ENCOUNTER — Ambulatory Visit (INDEPENDENT_AMBULATORY_CARE_PROVIDER_SITE_OTHER): Payer: PPO | Admitting: Family Medicine

## 2017-12-14 VITALS — BP 102/72 | HR 53 | Temp 98.4°F | Ht 69.0 in | Wt 152.0 lb

## 2017-12-14 DIAGNOSIS — Z23 Encounter for immunization: Secondary | ICD-10-CM

## 2017-12-14 DIAGNOSIS — E785 Hyperlipidemia, unspecified: Secondary | ICD-10-CM | POA: Diagnosis not present

## 2017-12-14 DIAGNOSIS — Z111 Encounter for screening for respiratory tuberculosis: Secondary | ICD-10-CM

## 2017-12-14 LAB — POC URINALSYSI DIPSTICK (AUTOMATED)
Bilirubin, UA: NEGATIVE
Blood, UA: NEGATIVE
Glucose, UA: NEGATIVE
Ketones, UA: NEGATIVE
Leukocytes, UA: NEGATIVE
Nitrite, UA: NEGATIVE
Protein, UA: NEGATIVE
Spec Grav, UA: 1.02 (ref 1.010–1.025)
Urobilinogen, UA: 0.2 E.U./dL
pH, UA: 6 (ref 5.0–8.0)

## 2017-12-14 NOTE — Assessment & Plan Note (Signed)
S: controlled on atorvastatin 20mg  Lab Results  Component Value Date   CHOL 157 08/07/2017   HDL 58.20 08/07/2017   LDLCALC 90 08/07/2017   TRIG 45.0 08/07/2017   CHOLHDL 3 08/07/2017   A/P: continue current rx- reasonable to have goal LDL 100 or less

## 2017-12-14 NOTE — Patient Instructions (Addendum)
Health Maintenance Due  Topic Date Due  . INFLUENZA VACCINE - today 10/01/2017   Return on Wednesday for nurse visit for TB skin test check  No changes today  We will submit forms after TB test  Urine normal

## 2017-12-14 NOTE — Progress Notes (Signed)
Subjective:  Charles Stephenson is a 83 y.o. year old very pleasant male patient who presents for/with See problem oriented charting ROS- No chest pain or shortness of breath. No headache or blurry vision.    Past Medical History-  Patient Active Problem List   Diagnosis Date Noted  . History of basal cell carcinoma of skin 12/10/2015    Priority: Medium  . Hyperlipidemia 03/12/2012    Priority: Medium  . AAA (abdominal aortic aneurysm) ([place]) 03/12/2012    Priority: Medium  . Vertigo 05/29/2016    Priority: Low  . Colon cancer screening 04/21/2016    Priority: Low  . Seasonal allergies     Priority: Low  . Chest pain 03/12/2012    Priority: Low    Medications- reviewed and updated Current Outpatient Medications  Medication Sig Dispense Refill  . atorvastatin (LIPITOR) 20 MG tablet Take 1 tablet (20 mg total) by mouth daily at 6 PM. 91 tablet 3  . Cholecalciferol (VITAMIN D3) 2000 units capsule Take 4,000 Units by mouth daily.     . Multiple Vitamins-Minerals (CENTRUM SILVER PO) Take 1 tablet by mouth daily.     . PSYLLIUM FIBER PO Take 6-8 tablets by mouth at bedtime.     No current facility-administered medications for this visit.     Objective: BP 102/72 (BP Location: Left Arm, Patient Position: Sitting, Cuff Size: Large)   Pulse (!) 53   Temp 98.4 F (36.9 C) (Oral)   Ht 5\' 9"  (1.753 m)   Wt 152 lb (68.9 kg)   SpO2 97%   BMI 22.45 kg/m  Gen: NAD, resting comfortably HEENT exam normal other than optho finding below No carotid bruits, no thyromegaly CV: RRR no murmurs rubs or gallops Lungs: CTAB no crackles, wheeze, rhonchi Abdomen: soft/nontender/nondistended/normal bowel sounds. No rebound or guarding.  Ext: no edema Skin: warm, dry Neuro: grossly normal, moves all extremities, left pupil larger than right- noted for years and monitored by optho  Assessment/Plan:  Other notes: 1.PPD today placed- Wednesday reading 2. Needs UA for forms - check under  hyperlipidemia 3. Flu shot today 4. Filled out forms for [place]  Hyperlipidemia S: controlled on atorvastatin 20mg  Lab Results  Component Value Date   CHOL 157 08/07/2017   HDL 58.20 08/07/2017   LDLCALC 90 08/07/2017   TRIG 45.0 08/07/2017   CHOLHDL 3 08/07/2017   A/P: continue current rx- reasonable to have goal LDL 100 or less    Future Appointments  Date Time Provider [place]  12/16/2017  2:00 PM [facility] NURSE [facility]   No follow-ups on file.  Lab/Order associations: Hyperlipidemia, unspecified hyperlipidemia type - Plan: POCT Urinalysis Dipstick (Automated)  Need for prophylactic vaccination and inoculation against influenza - Plan: Flu vaccine HIGH DOSE PF  Screening for tuberculosis - Plan: PPD  Time Stamp The duration of face-to-face time during this visit was greater than 15 minutes. Greater than 50% of this time was spent in counseling, explanation of diagnosis, planning of further management, and/or coordination of care including discussion of lipid and lipid goals, review of AAA repair history and need for good BP control- his is excellent, discussion of needed immunizations.    Return precautions advised.  Garret Reddish, MD

## 2017-12-15 DIAGNOSIS — Z85828 Personal history of other malignant neoplasm of skin: Secondary | ICD-10-CM | POA: Diagnosis not present

## 2017-12-15 DIAGNOSIS — L570 Actinic keratosis: Secondary | ICD-10-CM | POA: Diagnosis not present

## 2017-12-15 DIAGNOSIS — L821 Other seborrheic keratosis: Secondary | ICD-10-CM | POA: Diagnosis not present

## 2017-12-15 DIAGNOSIS — L218 Other seborrheic dermatitis: Secondary | ICD-10-CM | POA: Diagnosis not present

## 2017-12-15 DIAGNOSIS — D485 Neoplasm of uncertain behavior of skin: Secondary | ICD-10-CM | POA: Diagnosis not present

## 2017-12-15 DIAGNOSIS — C44329 Squamous cell carcinoma of skin of other parts of face: Secondary | ICD-10-CM | POA: Diagnosis not present

## 2017-12-16 ENCOUNTER — Encounter: Payer: Self-pay | Admitting: Family Medicine

## 2017-12-16 ENCOUNTER — Ambulatory Visit: Payer: PPO

## 2017-12-16 DIAGNOSIS — Z111 Encounter for screening for respiratory tuberculosis: Secondary | ICD-10-CM

## 2017-12-16 LAB — TB SKIN TEST
Induration: 0 mm
TB Skin Test: NEGATIVE

## 2017-12-16 NOTE — Progress Notes (Signed)
Pt here for reading of ppd, right forearm.  Read as 20mm induration-Negative.

## 2018-01-06 DIAGNOSIS — Z85828 Personal history of other malignant neoplasm of skin: Secondary | ICD-10-CM | POA: Diagnosis not present

## 2018-01-06 DIAGNOSIS — C44329 Squamous cell carcinoma of skin of other parts of face: Secondary | ICD-10-CM | POA: Diagnosis not present

## 2018-01-12 ENCOUNTER — Other Ambulatory Visit: Payer: Self-pay

## 2018-01-12 MED ORDER — ATORVASTATIN CALCIUM 20 MG PO TABS: 20.0000 mg | ORAL_TABLET | Freq: Every day | ORAL | 3 refills | Status: DC

## 2018-02-22 DIAGNOSIS — Z9889 Other specified postprocedural states: Secondary | ICD-10-CM | POA: Insufficient documentation

## 2018-02-22 DIAGNOSIS — Z8679 Personal history of other diseases of the circulatory system: Secondary | ICD-10-CM | POA: Insufficient documentation

## 2018-08-12 DIAGNOSIS — L814 Other melanin hyperpigmentation: Secondary | ICD-10-CM | POA: Diagnosis not present

## 2018-08-12 DIAGNOSIS — L570 Actinic keratosis: Secondary | ICD-10-CM | POA: Diagnosis not present

## 2018-08-12 DIAGNOSIS — L218 Other seborrheic dermatitis: Secondary | ICD-10-CM | POA: Diagnosis not present

## 2018-08-12 DIAGNOSIS — Z85828 Personal history of other malignant neoplasm of skin: Secondary | ICD-10-CM | POA: Diagnosis not present

## 2018-08-12 DIAGNOSIS — D225 Melanocytic nevi of trunk: Secondary | ICD-10-CM | POA: Diagnosis not present

## 2018-08-12 DIAGNOSIS — L821 Other seborrheic keratosis: Secondary | ICD-10-CM | POA: Diagnosis not present

## 2018-11-11 DIAGNOSIS — L570 Actinic keratosis: Secondary | ICD-10-CM | POA: Diagnosis not present

## 2018-11-11 DIAGNOSIS — Z85828 Personal history of other malignant neoplasm of skin: Secondary | ICD-10-CM | POA: Diagnosis not present

## 2018-11-17 ENCOUNTER — Other Ambulatory Visit: Payer: Self-pay

## 2018-11-17 DIAGNOSIS — I714 Abdominal aortic aneurysm, without rupture, unspecified: Secondary | ICD-10-CM

## 2018-11-19 ENCOUNTER — Other Ambulatory Visit: Payer: Self-pay

## 2018-11-19 ENCOUNTER — Ambulatory Visit (HOSPITAL_COMMUNITY)
Admission: RE | Admit: 2018-11-19 | Discharge: 2018-11-19 | Disposition: A | Discharge status: Home / Self Care | Payer: PPO | Source: Ambulatory Visit | Attending: Family | Admitting: Family

## 2018-11-19 ENCOUNTER — Ambulatory Visit (INDEPENDENT_AMBULATORY_CARE_PROVIDER_SITE_OTHER): Payer: PPO | Admitting: Family

## 2018-11-19 ENCOUNTER — Encounter: Payer: Self-pay | Admitting: Family

## 2018-11-19 VITALS — BP 134/76 | HR 49 | Temp 96.5°F | Resp 16 | Ht 70.0 in | Wt 156.0 lb

## 2018-11-19 DIAGNOSIS — I714 Abdominal aortic aneurysm, without rupture, unspecified: Secondary | ICD-10-CM

## 2018-11-19 DIAGNOSIS — Z95828 Presence of other vascular implants and grafts: Secondary | ICD-10-CM | POA: Diagnosis not present

## 2018-11-19 NOTE — Patient Instructions (Signed)
Before your next abdominal ultrasound:  Avoid gas forming foods and beverages the day before the test.   Take two Extra-Strength Gas-X capsules at bedtime the night before the test. Take another two Extra-Strength Gas-X capsules in the middle of the night if you get up to the restroom, if not, first thing in the morning with water.  Do not chew gum.     

## 2018-11-19 NOTE — Progress Notes (Signed)
[facility]  CC: Follow up s/p Endovascular Repair of Abdominal Aortic Aneurysm    History of Present Illness  Charles Stephenson is a 84 y.o. (Feb 05, 1935) male who is s/p Gore Excluder stent graft repair of abdominal aortic aneurysm and internal iliac artery aneurysm coil embolization of left internal iliac branches on 04-13-17 by Dr. Donnetta Hutching.  Dr. Donnetta Hutching last evaluated pt on 11-24-17. At that time abdominal aortic duplex that day showed maximal diameter of his aortic aneurysm sac at 4.9 cm which had decreased from 5.1 cm.  Stable follow-up stent graft repair with no evidence of significant endoleak or sac expansion.  He was to continue his full activities without limitation.  He was to follow up in 1 year with repeat duplex.  He had prominent popliteal pulses; July 2016 popliteal artery duplex demonstrated no evidence of popliteal artery aneurysms.   Hw does not have back or abdominal pain, even though he has had back surgery.  He denies claudication type symptoms in his legs with walking, he walks 4 miles daily. He lives in [place] near [employer].   He denies any history of stroke or TIA symptoms, denies any cardiac problems.   He states that his heart rate has always been in the 50's, even since childhood, and he has remained asymptomatic of this; he states that his PCP is aware of this.  Pt hadcardiac stress testing as part of pt's routine exam in March 2017, states it was normal, pt denies any personal history of CAD.  He denies light-headedness, denies chest pain, denies dyspnea.    His father died of clotting disorder during surgery for AAA, as pt describes, sounds like possibly DIC.  Pt's brother also has a AAA, had an EVAR placed which became infected, 10 hour surgery to correct this; states his brother was obese, smoked, was in poor health.   He no longer takes a daily ASA as directed by his PCP, pt states he does not  need it.  Pt states his ophthalmologist told him that his left pupil is larger than right due to cataract surgery.   He hadright radiculopathy, hadESI's, has resolved.  Diabetic: No Tobacco use: none He denies any ETOH consumption   Past Medical History:  Diagnosis Date  . AAA (abdominal aortic aneurysm) ([place])   . Basal cell carcinoma   . Diverticulitis    3x through 18 months around 2012  . Hepatitis   . History of alcohol abuse    sober since age 57  . History of vertigo    december 2007   . Hypercholesterolemia   . Seasonal allergies    no rx   Past Surgical History:  Procedure Laterality Date  . ABDOMINAL AORTIC ENDOVASCULAR STENT GRAFT  04/13/2017   w/EMBOLIZATION LEFT HYPOGASTRIC USING INTERLOCK-35 DETACHABLE COIL/notes 04/13/2017  . ABDOMINAL AORTIC ENDOVASCULAR STENT GRAFT N/A 04/13/2017   Procedure: ABDOMINAL AORTIC ENDOVASCULAR STENT GRAFT;  Surgeon: Rosetta Posner, MD;  Location: [facility];  Service: Vascular;  Laterality: N/A;  . BACK SURGERY    . BASAL CELL CARCINOMA EXCISION  X~ 9   "chest, back, left leg" (04/13/2017)  . CARDIOVASCULAR STRESS TEST  Jan. 14,2014  . CATARACT EXTRACTION W/ INTRAOCULAR LENS  IMPLANT, BILATERAL Bilateral    L pupil larger after lens implant  . COLONOSCOPY    . EMBOLIZATION N/A 04/13/2017   Procedure: EMBOLIZATION LEFT HYPOGASTRIC USING INTERLOCK-35 DETACHABLE COIL;  Surgeon: Rosetta Posner, MD;  [facility];  Service: Vascular;  Laterality: N/A;  . INGUINAL HERNIA REPAIR Right 2005  . MOHS SURGERY  09/2014; ~ 2014   BCC; nose and left ear  . POSTERIOR FUSION LUMBAR SPINE  10/15/1982   "the 2 lowest discs fused"   Social History Social History   Tobacco Use  . Smoking status: Never Smoker  . Smokeless tobacco: Never Used  Substance Use Topics  . Alcohol use: No    Comment: 04/13/2017 "recovering alcoholic; [id number]"  . Drug use: No   Family History Family History  Problem Relation Age of Onset  . AAA (abdominal  aortic aneurysm) Father        sounds like died of DIC- otherwise healthy  . Peripheral vascular disease Father        Aneurysm- Aorta  . AAA (abdominal aortic aneurysm) Brother        states multiple medicle issues but died of pulmonary fibrosis- was told smoking related  . Heart disease Brother 74       Before age 20  . Heart attack Brother        x2  . Alcohol abuse Brother        sober 25 years when he died  . Prostate cancer Brother   . Cancer Mother        Stomach   Current Outpatient Medications on File Prior to Visit  Medication Sig Dispense Refill  . atorvastatin (LIPITOR) 20 MG tablet Take 1 tablet (20 mg total) by mouth daily at 6 PM. 91 tablet 3  . Cholecalciferol (VITAMIN D3) 2000 units capsule Take 4,000 Units by mouth daily.     . Multiple Vitamins-Minerals (CENTRUM SILVER PO) Take 1 tablet by mouth daily.     . PSYLLIUM FIBER PO Take 6-8 tablets by mouth at bedtime.     No current facility-administered medications on file prior to visit.    No Known Allergies   ROS: See HPI for pertinent positives and negatives.  Physical Examination  Vitals:   11/19/18 0829  BP: 134/76  Pulse: (!) 49  Resp: 16  Temp: (!) 96.5 F (35.8 C)  TempSrc: Temporal  SpO2: 98%  Weight: 156 lb (70.8 kg)  Height: 5\' 10"  (1.778 m)   Body mass index is 22.38 kg/m.  General: A&O x 3, WD, slender fit appearing male HEENT: No gross abnormalities  Pulmonary: Sym exp, respirations are non labored, good air movement in all fields CTAB, no rales, rhonchi, or wheezes Cardiac: Regular rhythm, bradycardic (not on a beta blocker), no murmur appreciated  Vascular: Vessel Right Left  Radial 2+Palpable 2+Palpable  Carotid  without bruit  without bruit  Aorta Not palpable N/A  Popliteal Not palpable Not palpable  PT 2+Palpable 2+Palpable  DP 1+Palpable 1+Palpable   Gastrointestinal: soft, NTND, -G/R, - HSM, - palpable masses, - CVAT B. Musculoskeletal: M/S 5/5 throughout, extremities  without ischemic changes Skin: No rashes, no ulcers, no cellulitis.   Neurologic: Pain and light touch intact in extremities, Motor exam as listed above. Psychiatric: Normal thought content, mood appropriate for clinical situation.    DATA  EVAR Duplex   Previous (Date: 11-24-17) AAA sac size: 4.9 cm  Current (Date: 11-19-18) Endovascular Aortic Repair (EVAR): +----------+----------------+-------------------+-------------------+           Diameter AP (cm)Diameter Trans (cm)Velocities (cm/sec) +----------+----------------+-------------------+-------------------+ Aorta     4.86            4.87  47                  +----------+----------------+-------------------+-------------------+ Right Limb1.57            1.49               147                 +----------+----------------+-------------------+-------------------+ Left Limb 1.49            1.50               67                  +----------+----------------+-------------------+-------------------+ Summary: Abdominal Aorta: The largest aortic measurement is 4.9 cm. Patent endovascular aneurysm repair with no evidence of endoleak. The largest aortic diameter remains essentially unchanged compared to prior exam. Previous diameter measurement was 4.9 cm  obtained on 11/24/2017.   Carotid Duplex (06-05-16): <40% bilateral ICA stenosis   Medical Decision Making  Talmage Horry is a 84 y.o. male who presents s/p EVAR (Date: 04-13-17) for a 5.1 cm AAA.   Pt is asymptomatic with stable sac size at 4.9 cm.  I discussed with the patient the importance of surveillance of the endograft.  The next endograft duplex will be scheduled for 12 months.  The patient will follow up with [place] in 12 months with these studies.  I emphasized the importance of maximal medical management including strict control of blood pressure, blood glucose, and lipid levels, antiplatelet agents, obtaining regular exercise, and  cessation of smoking.   Thank you for allowing [place] to participate in this patient's care.  Clemon Chambers, RN, MSN, FNP-C [facility] Office: [phone]  Clinic Physician: Donzetta Matters  11/19/2018, 8:37 AM

## 2019-01-17 ENCOUNTER — Telehealth: Payer: Self-pay | Admitting: Family Medicine

## 2019-01-17 MED ORDER — ATORVASTATIN CALCIUM 20 MG PO TABS: 20.0000 mg | ORAL_TABLET | Freq: Every day | ORAL | 0 refills | Status: DC

## 2019-01-17 NOTE — Telephone Encounter (Signed)
Called in 30 day will need office visit for more refills. Has not been seen in over a year.  Please call for appointment.

## 2019-01-17 NOTE — Telephone Encounter (Signed)
atorvastatin (LIPITOR) 20 MG tablet    Patient requesting refill- he states he is out of this medication.    Pharmacy:  [facility], [place] - [address] [phone] (Phone) [phone] (Fax)

## 2019-01-17 NOTE — Telephone Encounter (Signed)
LVM FOR PATIENT TO CALL BACK AND SCHEDULE APPT WITH DR. [name]

## 2019-01-17 NOTE — Telephone Encounter (Signed)
See request °

## 2019-01-18 ENCOUNTER — Other Ambulatory Visit: Payer: Self-pay | Admitting: Family Medicine

## 2019-01-25 ENCOUNTER — Other Ambulatory Visit: Payer: Self-pay

## 2019-02-02 ENCOUNTER — Telehealth: Payer: Self-pay | Admitting: Family Medicine

## 2019-02-02 NOTE — Telephone Encounter (Signed)
I left a message asking the patient to call and schedule Medicare AWV with Courtney ([facility]).  If patient calls back, please schedule Medicare Wellness Visit (initial) at next available opening.  VDM (Dee-Dee) 

## 2019-02-09 ENCOUNTER — Other Ambulatory Visit: Payer: Self-pay | Admitting: Family Medicine

## 2019-02-11 DIAGNOSIS — Z85828 Personal history of other malignant neoplasm of skin: Secondary | ICD-10-CM | POA: Diagnosis not present

## 2019-02-11 DIAGNOSIS — L821 Other seborrheic keratosis: Secondary | ICD-10-CM | POA: Diagnosis not present

## 2019-02-11 DIAGNOSIS — L570 Actinic keratosis: Secondary | ICD-10-CM | POA: Diagnosis not present

## 2019-02-11 DIAGNOSIS — D225 Melanocytic nevi of trunk: Secondary | ICD-10-CM | POA: Diagnosis not present

## 2019-02-11 DIAGNOSIS — L814 Other melanin hyperpigmentation: Secondary | ICD-10-CM | POA: Diagnosis not present

## 2019-02-11 DIAGNOSIS — L853 Xerosis cutis: Secondary | ICD-10-CM | POA: Diagnosis not present

## 2019-02-14 ENCOUNTER — Other Ambulatory Visit: Payer: Self-pay | Admitting: Family Medicine

## 2019-02-14 NOTE — Telephone Encounter (Signed)
Requested medication (s) are due for refill today: yes  Requested medication (s) are on the active medication list: yes  Last refill:  01/18/2019  Future visit scheduled: yes  Notes to clinic:  Patient would like a 90 day supply   Requested Prescriptions  Pending Prescriptions Disp Refills   atorvastatin (LIPITOR) 20 MG tablet 90 tablet 3      Cardiovascular:  Antilipid - Statins Failed - 02/14/2019  9:34 AM      Failed - Total Cholesterol in normal range and within 360 days    Cholesterol  Date Value Ref Range Status  08/07/2017 157 0 - 200 mg/dL Final    Comment:    ATP III Classification       Desirable:  < 200 mg/dL               Borderline High:  200 - 239 mg/dL          High:  > = 240 mg/dL          Failed - LDL in normal range and within 360 days    LDL Cholesterol  Date Value Ref Range Status  08/07/2017 90 0 - 99 mg/dL Final          Failed - HDL in normal range and within 360 days    HDL  Date Value Ref Range Status  08/07/2017 58.20 >39.00 mg/dL Final          Failed - Triglycerides in normal range and within 360 days    Triglycerides  Date Value Ref Range Status  08/07/2017 45.0 0.0 - 149.0 mg/dL Final    Comment:    Normal:  <150 mg/dLBorderline High:  150 - 199 mg/dL          Failed - Valid encounter within last 12 months    Recent Outpatient Visits           1 year ago Hyperlipidemia, unspecified hyperlipidemia type   [place] [name], Brayton Mars, MD   1 year ago Preventative health care   [facility] [name], Brayton Mars, MD   1 year ago Subacute pansinusitis   [facility], [place], DO   2 years ago [place] [facility], Langley Adie, MD   2 years ago Bacterial sinusitis   [facility], Brayton Mars, MD       Future Appointments             In 1 month [place] Channel, Brayton Mars, MD [place],  Seth Ward - Patient is not pregnant

## 2019-02-14 NOTE — Telephone Encounter (Signed)
Patient has not been seen in last 12 months. Schedule appt for medication refill

## 2019-02-14 NOTE — Telephone Encounter (Signed)
Medication Refill - Medication: atorvastatin (LIPITOR) 20 MG tablet  Patient would like a 90 day supply.  He does have an upcoming appt. On 03/18/19   Preferred Pharmacy (with phone number or street name):  [id number] [address], [place] Phone:  [phone]  Fax:  [phone]       Agent: Please be advised that RX refills may take up to 3 business days. We ask that you follow-up with your pharmacy.

## 2019-02-14 NOTE — Telephone Encounter (Signed)
Called the patient to schedule for the med-refill, and the patient states that when he called about needing the LIPITOR 20mg , Dr.[name] refilled it for 30 days. When he reached out earlier this month we told him he was supposed to have a physical scheduled but that appt isn't until January and wasn't aware that he needed a physical the first time he called.

## 2019-02-15 ENCOUNTER — Other Ambulatory Visit: Payer: Self-pay

## 2019-02-15 MED ORDER — ATORVASTATIN CALCIUM 20 MG PO TABS: ORAL_TABLET | ORAL | 0 refills | Status: DC

## 2019-03-17 ENCOUNTER — Other Ambulatory Visit: Payer: Self-pay

## 2019-03-18 ENCOUNTER — Encounter: Payer: Self-pay | Admitting: Family Medicine

## 2019-03-18 ENCOUNTER — Ambulatory Visit (INDEPENDENT_AMBULATORY_CARE_PROVIDER_SITE_OTHER): Payer: PPO

## 2019-03-18 ENCOUNTER — Ambulatory Visit (INDEPENDENT_AMBULATORY_CARE_PROVIDER_SITE_OTHER): Payer: PPO | Admitting: Family Medicine

## 2019-03-18 VITALS — BP 128/78 | HR 54 | Temp 98.2°F | Ht 70.0 in | Wt 163.1 lb

## 2019-03-18 VITALS — BP 128/78 | HR 54 | Temp 98.2°F | Ht 70.0 in | Wt 163.2 lb

## 2019-03-18 DIAGNOSIS — Z0000 Encounter for general adult medical examination without abnormal findings: Secondary | ICD-10-CM

## 2019-03-18 DIAGNOSIS — I714 Abdominal aortic aneurysm, without rupture, unspecified: Secondary | ICD-10-CM

## 2019-03-18 DIAGNOSIS — H3530 Unspecified macular degeneration: Secondary | ICD-10-CM | POA: Diagnosis not present

## 2019-03-18 DIAGNOSIS — E785 Hyperlipidemia, unspecified: Secondary | ICD-10-CM

## 2019-03-18 LAB — COMPREHENSIVE METABOLIC PANEL
ALT: 25 U/L (ref 0–53)
AST: 28 U/L (ref 0–37)
Albumin: 4.3 g/dL (ref 3.5–5.2)
Alkaline Phosphatase: 65 U/L (ref 39–117)
BUN: 16 mg/dL (ref 6–23)
CO2: 30 mEq/L (ref 19–32)
Calcium: 9.4 mg/dL (ref 8.4–10.5)
Chloride: 102 mEq/L (ref 96–112)
Creatinine, Ser: 0.92 mg/dL (ref 0.40–1.50)
GFR: 78.66 mL/min (ref >60.00)
Glucose, Bld: 74 mg/dL (ref 70–99)
Potassium: 4.9 mEq/L (ref 3.5–5.1)
Sodium: 139 mEq/L (ref 135–145)
Total Bilirubin: 0.7 mg/dL (ref 0.2–1.2)
Total Protein: 6.7 g/dL (ref 6.0–8.3)

## 2019-03-18 LAB — CBC WITH DIFFERENTIAL/PLATELET
Basophils Absolute: 0.1 10*3/uL (ref 0.0–0.1)
Basophils Relative: 0.9 % (ref 0.0–3.0)
Eosinophils Absolute: 0.1 10*3/uL (ref 0.0–0.7)
Eosinophils Relative: 2.4 % (ref 0.0–5.0)
HCT: 43 % (ref 39.0–52.0)
Hemoglobin: 14 g/dL (ref 13.0–17.0)
Lymphocytes Relative: 17.8 % (ref 12.0–46.0)
Lymphs Abs: 1 10*3/uL (ref 0.7–4.0)
MCHC: 32.6 g/dL (ref 30.0–36.0)
MCV: 92.7 fl (ref 78.0–100.0)
Monocytes Absolute: 0.7 10*3/uL (ref 0.1–1.0)
Monocytes Relative: 12 % (ref 3.0–12.0)
Neutro Abs: 3.9 10*3/uL (ref 1.4–7.7)
Neutrophils Relative %: 66.9 % (ref 43.0–77.0)
Platelets: 159 10*3/uL (ref 150.0–400.0)
RBC: 4.64 Mil/uL (ref 4.22–5.81)
RDW: 14.3 % (ref 11.5–15.5)
WBC: 5.8 10*3/uL (ref 4.0–10.5)

## 2019-03-18 LAB — LIPID PANEL
Cholesterol: 138 mg/dL (ref 0–200)
HDL: 55.9 mg/dL (ref >39.00)
LDL Cholesterol: 65 mg/dL (ref 0–99)
NonHDL: 82.01
Total CHOL/HDL Ratio: 2
Triglycerides: 86 mg/dL (ref 0.0–149.0)
VLDL: 17.2 mg/dL (ref 0.0–40.0)

## 2019-03-18 MED ORDER — ATORVASTATIN CALCIUM 20 MG PO TABS: ORAL_TABLET | ORAL | 4 refills | Status: DC

## 2019-03-18 NOTE — Progress Notes (Signed)
Subjective:   Charles Stephenson is a 85 y.o. male who presents for Medicare Annual/Subsequent preventive examination.  Review of Systems:   Cardiac Risk Factors include: advanced age (>60men, >63 women);male gender;dyslipidemia    Objective:    Vitals: BP 128/78   Pulse (!) 54   Temp 98.2 F (36.8 C) (Temporal)   Ht 5\' 10"  (1.778 m)   Wt 163 lb 2.3 oz (74 kg)   BMI 23.41 kg/m   Body mass index is 23.41 kg/m.  Advanced Directives 03/18/2019 04/13/2017 04/08/2017 06/05/2016 11/29/2015 04/07/2015 04/03/2015  Does Patient Have a Medical Advance Directive? Yes Yes Yes Yes Yes Yes Yes  Type of Advance Directive Living will;Healthcare Power of Attorney Living will [place];Living will [place];Living will [place];Living will [place];Living will [place];Living will  Does patient want to make changes to medical advance directive? No - Patient declined No - Patient declined No - Patient declined - - No - Patient declined No - Patient declined  Copy of [place] in Chart? No - copy requested - No - copy requested - - No - copy requested No - copy requested    Tobacco Social History   Tobacco Use  Smoking Status Never Smoker  Smokeless Tobacco Never Used     Counseling given: Not Answered   Clinical Intake:  Pre-visit preparation completed: Yes  Pain : No/denies pain  Diabetes: No  How often do you need to have someone help you when you read instructions, pamphlets, or other written materials from your doctor or pharmacy?: 1 - Never  Interpreter Needed?: No  Information entered by :: Denman George LPN  Past Medical History:  Diagnosis Date  . AAA (abdominal aortic aneurysm) ([place])   . Basal cell carcinoma   . Diverticulitis    3x through 18 months around 2012  . Hepatitis   . History of alcohol abuse    sober since age 83  . History of vertigo    december 2007   . Hypercholesterolemia   . Seasonal allergies    no rx   Past Surgical History:  Procedure Laterality Date  . ABDOMINAL AORTIC ENDOVASCULAR STENT GRAFT  04/13/2017   w/EMBOLIZATION LEFT HYPOGASTRIC USING INTERLOCK-35 DETACHABLE COIL/notes 04/13/2017  . ABDOMINAL AORTIC ENDOVASCULAR STENT GRAFT N/A 04/13/2017   Procedure: ABDOMINAL AORTIC ENDOVASCULAR STENT GRAFT;  Surgeon: Rosetta Posner, MD;  Location: [facility];  Service: Vascular;  Laterality: N/A;  . BACK SURGERY    . BASAL CELL CARCINOMA EXCISION  X~ 9   "chest, back, left leg" (04/13/2017)  . CARDIOVASCULAR STRESS TEST  Jan. 14,2014  . CATARACT EXTRACTION W/ INTRAOCULAR LENS  IMPLANT, BILATERAL Bilateral    L pupil larger after lens implant  . COLONOSCOPY    . EMBOLIZATION N/A 04/13/2017   Procedure: EMBOLIZATION LEFT HYPOGASTRIC USING INTERLOCK-35 DETACHABLE COIL;  Surgeon: Rosetta Posner, MD;  Location: [facility];  Service: Vascular;  Laterality: N/A;  . INGUINAL HERNIA REPAIR Right 2005  . MOHS SURGERY  09/2014; ~ 2014   BCC; nose and left ear  . POSTERIOR FUSION LUMBAR SPINE  10/15/1982   "the 2 lowest discs fused"   Family History  Problem Relation Age of Onset  . AAA (abdominal aortic aneurysm) Father        sounds like died of DIC- otherwise healthy  . Peripheral vascular disease Father        Aneurysm- Aorta  .  AAA (abdominal aortic aneurysm) Brother        states multiple medicle issues but died of pulmonary fibrosis- was told smoking related  . Heart disease Brother 6       Before age 1  . Heart attack Brother        x2  . Alcohol abuse Brother        sober 25 years when he died  . Prostate cancer Brother   . Cancer Mother        Stomach   Social History   Socioeconomic History  . Marital status: Married    Spouse name: Not on file  . Number of children: 2  . Years of education: Not on file  . Highest education level: Not on file  Occupational History  . 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 Smoking status: Never Smoker  . Smokeless tobacco: Never Used  Substance and Sexual Activity  . Alcohol use: No    Comment: 04/13/2017 "recovering alcoholic; [id number]"  . Drug use: No  . Sexual activity: Not Currently  Other Topics Concern  . Not on file  Social History Narrative   Married to wife Charles Stephenson (patient of mine). 2 kids. No grandkids. No pets.       Retired in 2008 from [employer] to [employer]: golfing 2-3 days a week, down to place in [place]- walk at [employer], walks 2.5 miles daily, service work      Lives in [place] Strain:   . Difficulty of Paying Living Expenses: Not on file  Food Insecurity:   . Worried About Charity fundraiser in the Last Year: Not on file  . Ran Out of Food in the Last Year: Not on file  Transportation Needs:   . Lack of Transportation (Medical): Not on file  . Lack of Transportation (Non-Medical): Not on file  Physical Activity:   . Days of Exercise per Week: Not on file  . Minutes of Exercise per Session: Not on file  Stress:   . Feeling of Stress : Not on file  Social Connections:   . Frequency of Communication with Friends and Family: Not on file  . Frequency of Social Gatherings with Friends and Family: Not on file  . Attends Religious Services: Not on file  . Active Member of Clubs or Organizations: Not on file  . Attends Archivist Meetings: Not on file  . Marital Status: Not on file    Outpatient Encounter Medications as of 03/18/2019  Medication Sig  . atorvastatin (LIPITOR) 20 MG tablet TAKE 1 TABLET BY MOUTH EVERY EVENING AT 6PM  . Cholecalciferol (VITAMIN D3) 2000 units capsule Take 4,000 Units by mouth daily.   . Multiple Vitamins-Minerals (CENTRUM SILVER PO) Take 1 tablet by mouth daily.   . Multiple Vitamins-Minerals (ICAPS AREDS FORMULA PO)  Take by mouth.  . PSYLLIUM FIBER PO Take 6-8 tablets by mouth at bedtime.   No facility-administered encounter medications on file as of 03/18/2019.    Activities of Daily Living In your present state of health, do you have any difficulty performing the following activities: 03/18/2019  Hearing? N  Vision? N  Difficulty concentrating or making decisions? N  Walking or climbing stairs? N  Dressing or bathing? N  Doing errands,  shopping? N  Preparing Food and eating ? N  Using the Toilet? N  In the past six months, have you accidently leaked urine? N  Do you have problems with loss of bowel control? N  Managing your Medications? N  Managing your Finances? N  Housekeeping or managing your Housekeeping? N  Some recent data might be hidden    Patient Care Team: Marin Olp, MD as PCP - General (Family Medicine) Laurence Spates, MD as Consulting Physician (Gastroenterology)   Assessment:   This is a routine wellness examination for Demarquise.  Exercise Activities and Dietary recommendations Current Exercise Habits: Home exercise routine, Type of exercise: strength training/weights;walking, Time (Minutes): 45, Frequency (Times/Week): 7, Weekly Exercise (Minutes/Week): 315, Intensity: Moderate  Goals   None     Fall Risk Fall Risk  03/18/2019 01/25/2019 12/14/2017 05/29/2016 12/10/2015  Falls in the past year? 0 0 No No No  Comment - Emmi Telephone Survey: data to providers prior to load - - -  Number falls in past yr: 0 - - - -  Injury with Fall? 0 - - - -  Follow up Falls evaluation completed;Education provided;Falls prevention discussed - - - -   Is the patient's home free of loose throw rugs in walkways, pet beds, electrical cords, etc?   yes      Grab bars in the bathroom? yes      Handrails on the stairs?   yes      Adequate lighting?   yes  Timed Get Up and Go Performed: completed and within normal timeframe; no gait abnormalities noted   Depression Screen PHQ 2/9  Scores 03/18/2019 12/14/2017 05/29/2016 12/10/2015  PHQ - 2 Score 0 0 0 0    Cognitive Function- no cognitive concerns at this time     6CIT Screen 03/18/2019  What Year? 0 points  What month? 0 points  What time? 0 points  Count back from 20 0 points  Months in reverse 0 points  Repeat phrase 0 points  Total Score 0    Immunization History  Administered Date(s) Administered  . Fluad Quad(high Dose 65+) 12/13/2018  . Influenza, High Dose Seasonal PF 12/10/2015, 12/05/2016, 12/14/2017  . Influenza-Unspecified 12/01/2012, 11/28/2016  . Moderna SARS-COVID-2 Vaccination 03/15/2019  . PPD Test 12/14/2017  . Pneumococcal Conjugate-13 05/29/2016  . Pneumococcal Polysaccharide-23 08/07/2017  . Rabies, IM 08/20/2017, 08/23/2017, 09/03/2017  . Tdap 08/20/2017    Qualifies for Shingles Vaccine? Discussed and patient will check with pharmacy for coverage.  Patient education handout provided   Screening Tests Health Maintenance  Topic Date Due  . TETANUS/TDAP  08/21/2027  . INFLUENZA VACCINE  Completed  . PNA vac Low Risk Adult  Completed   Cancer Screenings: Lung: Low Dose CT Chest recommended if Age 51-80 years, 30 pack-year currently smoking OR have quit w/in 15years. Patient does not qualify. Colorectal: No longer indicated; last colonoscopy 03/31/16    Plan:  I have personally reviewed and addressed the Medicare Annual Wellness questionnaire and have noted the following in the patient's chart:  A. Medical and social history B. Use of alcohol, tobacco or illicit drugs  C. Current medications and supplements D. Functional ability and status E.  Nutritional status F.  Physical activity G. Advance directives H. List of other physicians I.  Hospitalizations, surgeries, and ER visits in previous 12 months J.  [place] such as hearing and vision if needed, cognitive and depression L. Referrals, records requested, and appointments- none   In  addition, I have reviewed  and discussed with patient certain preventive protocols, quality metrics, and best practice recommendations. A written personalized care plan for preventive services as well as general preventive health recommendations were provided to patient.   Signed,  Denman George, LPN  Nurse Health Advisor   Nurse Notes: no additional

## 2019-03-18 NOTE — Patient Instructions (Signed)
Charles Stephenson , Thank you for taking time to come for your Medicare Wellness Visit. I appreciate your ongoing commitment to your health goals. Please review the following plan we discussed and let me know if I can assist you in the future.   Screening recommendations/referrals: Colorectal Screening: No longer indicated   Vision and Dental Exams: Recommended annual ophthalmology exams for early detection of glaucoma and other disorders of the eye Recommended annual dental exams for proper oral hygiene  Vaccinations: Influenza vaccine: completed 12/13/18 Pneumococcal vaccine: up to date; last 08/07/17 Tdap vaccine: up to date; last 08/20/17 Shingles vaccine: Please call your insurance company to determine your out of pocket expense for the Shingrix vaccine. You may receive this vaccine at your local pharmacy.  Advanced directives: Please bring a copy of your POA (Power of Attorney) and/or Living Will to your next appointment.  Goals: Recommend to drink at least 6-8 8oz glasses of water per day and consume a balanced diet rich in fresh fruits and vegetables.  Next appointment: Please schedule your Annual Wellness Visit with your Nurse Health Advisor in one year.  Preventive Care 83 Years and Older, Male Preventive care refers to lifestyle choices and visits with your health care provider that can promote health and wellness. What does preventive care include?  A yearly physical exam. This is also called an annual well check.  Dental exams once or twice a year.  Routine eye exams. Ask your health care provider how often you should have your eyes checked.  Personal lifestyle choices, including:  Daily care of your teeth and gums.  Regular physical activity.  Eating a healthy diet.  Avoiding tobacco and drug use.  Limiting alcohol use.  Practicing safe sex.  Taking low doses of aspirin every day if recommended by your health care provider..  Taking vitamin and mineral supplements  as recommended by your health care provider. What happens during an annual well check? The services and screenings done by your health care provider during your annual well check will depend on your age, overall health, lifestyle risk factors, and family history of disease. Counseling  Your health care provider may ask you questions about your:  Alcohol use.  Tobacco use.  Drug use.  Emotional well-being.  Home and relationship well-being.  Sexual activity.  Eating habits.  History of falls.  Memory and ability to understand (cognition).  Work and work Statistician. Screening  You may have the following tests or measurements:  Height, weight, and BMI.  Blood pressure.  Lipid and cholesterol levels. These may be checked every 5 years, or more frequently if you are over 83 years old.  Skin check.  Lung cancer screening. You may have this screening every year starting at age 83 if you have a 30-pack-year history of smoking and currently smoke or have quit within the past 15 years.  Fecal occult blood test (FOBT) of the stool. You may have this test every year starting at age 83.  Flexible sigmoidoscopy or colonoscopy. You may have a sigmoidoscopy every 5 years or a colonoscopy every 10 years starting at age 83.  Prostate cancer screening. Recommendations will vary depending on your family history and other risks.  Hepatitis C blood test.  Hepatitis B blood test.  Sexually transmitted disease (STD) testing.  Diabetes screening. This is done by checking your blood sugar (glucose) after you have not eaten for a while (fasting). You may have this done every 1-3 years.  Abdominal aortic aneurysm (AAA) screening. You  may need this if you are a current or former smoker.  Osteoporosis. You may be screened starting at age 83 if you are at high risk. Talk with your health care provider about your test results, treatment options, and if necessary, the need for more  tests. Vaccines  Your health care provider may recommend certain vaccines, such as:  Influenza vaccine. This is recommended every year.  Tetanus, diphtheria, and acellular pertussis (Tdap, Td) vaccine. You may need a Td booster every 10 years.  Zoster vaccine. You may need this after age 35.  Pneumococcal 13-valent conjugate (PCV13) vaccine. One dose is recommended after age 14.  Pneumococcal polysaccharide (PPSV23) vaccine. One dose is recommended after age 59. Talk to your health care provider about which screenings and vaccines you need and how often you need them. This information is not intended to replace advice given to you by your health care provider. Make sure you discuss any questions you have with your health care provider. Document Released: 03/16/2015 Document Revised: 11/07/2015 Document Reviewed: 12/19/2014 Elsevier Interactive Patient Education  2017 [place] Prevention in the Home Falls can cause injuries. They can happen to people of all ages. There are many things you can do to make your home safe and to help prevent falls. What can I do on the outside of my home?  Regularly fix the edges of walkways and driveways and fix any cracks.  Remove anything that might make you trip as you walk through a door, such as a raised step or threshold.  Trim any bushes or trees on the path to your home.  Use bright outdoor lighting.  Clear any walking paths of anything that might make someone trip, such as rocks or tools.  Regularly check to see if handrails are loose or broken. Make sure that both sides of any steps have handrails.  Any raised decks and porches should have guardrails on the edges.  Have any leaves, snow, or ice cleared regularly.  Use sand or salt on walking paths during winter.  Clean up any spills in your garage right away. This includes oil or grease spills. What can I do in the bathroom?  Use night lights.  Install grab bars by the  toilet and in the tub and shower. Do not use towel bars as grab bars.  Use non-skid mats or decals in the tub or shower.  If you need to sit down in the shower, use a plastic, non-slip stool.  Keep the floor dry. Clean up any water that spills on the floor as soon as it happens.  Remove soap buildup in the tub or shower regularly.  Attach bath mats securely with double-sided non-slip rug tape.  Do not have throw rugs and other things on the floor that can make you trip. What can I do in the bedroom?  Use night lights.  Make sure that you have a light by your bed that is easy to reach.  Do not use any sheets or blankets that are too big for your bed. They should not hang down onto the floor.  Have a firm chair that has side arms. You can use this for support while you get dressed.  Do not have throw rugs and other things on the floor that can make you trip. What can I do in the kitchen?  Clean up any spills right away.  Avoid walking on wet floors.  Keep items that you use a lot in easy-to-reach places.  If you need to reach something above you, use a strong step stool that has a grab bar.  Keep electrical cords out of the way.  Do not use floor polish or wax that makes floors slippery. If you must use wax, use non-skid floor wax.  Do not have throw rugs and other things on the floor that can make you trip. What can I do with my stairs?  Do not leave any items on the stairs.  Make sure that there are handrails on both sides of the stairs and use them. Fix handrails that are broken or loose. Make sure that handrails are as long as the stairways.  Check any carpeting to make sure that it is firmly attached to the stairs. Fix any carpet that is loose or worn.  Avoid having throw rugs at the top or bottom of the stairs. If you do have throw rugs, attach them to the floor with carpet tape.  Make sure that you have a light switch at the top of the stairs and the bottom of  the stairs. If you do not have them, ask someone to add them for you. What else can I do to help prevent falls?  Wear shoes that:  Do not have high heels.  Have rubber bottoms.  Are comfortable and fit you well.  Are closed at the toe. Do not wear sandals.  If you use a stepladder:  Make sure that it is fully opened. Do not climb a closed stepladder.  Make sure that both sides of the stepladder are locked into place.  Ask someone to hold it for you, if possible.  Clearly mark and make sure that you can see:  Any grab bars or handrails.  First and last steps.  Where the edge of each step is.  Use tools that help you move around (mobility aids) if they are needed. These include:  Canes.  Walkers.  Scooters.  Crutches.  Turn on the lights when you go into a dark area. Replace any light bulbs as soon as they burn out.  Set up your furniture so you have a clear path. Avoid moving your furniture around.  If any of your floors are uneven, fix them.  If there are any pets around you, be aware of where they are.  Review your medicines with your doctor. Some medicines can make you feel dizzy. This can increase your chance of falling. Ask your doctor what other things that you can do to help prevent falls. This information is not intended to replace advice given to you by your health care provider. Make sure you discuss any questions you have with your health care provider. Document Released: 12/14/2008 Document Revised: 07/26/2015 Document Reviewed: 03/24/2014 Elsevier Interactive Patient Education  2017 [employer].

## 2019-03-18 NOTE — Patient Instructions (Addendum)
After you finish covid vaccine consider maybe a month or two later: Please check with your pharmacy to see if they have the shingrix vaccine. If they do- please get this immunization and update [place] by phone call or mychart with dates you receive the vaccine  Please stop by lab before you go If you do not have mychart- we will call you about results within 5 business days of [place] receiving them.  If you have mychart- we will send your results within 3 business days of [place] receiving them.  If abnormal or we want to clarify a result, we will call or mychart you to make sure you receive the message.  If you have questions or concerns or don't hear within 5-7 days, please send [place] a message or call us.   Recommended follow up: Return in about 1 year (around 03/17/2020) for physical or sooner if needed.

## 2019-03-18 NOTE — Progress Notes (Signed)
Phone: [phone]   Subjective:  Patient presents today for their annual physical. Chief complaint-noted.   See problem oriented charting- Review of Systems  Constitutional: Negative.   HENT: Negative.   Eyes: Negative.   Respiratory: Negative.   Cardiovascular: Negative.   Gastrointestinal: Positive for heartburn.       Uses otc medications only had 3 times in 7 yrs.    Genitourinary: Negative.   Musculoskeletal: Negative.   Skin: Negative.   Neurological: Negative.   Endo/Heme/Allergies: Negative.   Psychiatric/Behavioral: Negative.    The following were reviewed and entered/updated in epic: Past Medical History:  Diagnosis Date  . AAA (abdominal aortic aneurysm) ([place])   . Basal cell carcinoma   . Diverticulitis    3x through 18 months around 2012  . Hepatitis   . History of alcohol abuse    sober since age 74  . History of vertigo    december 2007   . Hypercholesterolemia   . Seasonal allergies    no rx   Patient Active Problem List   Diagnosis Date Noted  . History of basal cell carcinoma of skin 12/10/2015    Priority: Medium  . Hyperlipidemia 03/12/2012    Priority: Medium  . AAA (abdominal aortic aneurysm) ([place]) 03/12/2012    Priority: Medium  . Macular degeneration 03/18/2019    Priority: Low  . Vertigo 05/29/2016    Priority: Low  . Colon cancer screening 04/21/2016    Priority: Low  . Seasonal allergies     Priority: Low  . Chest pain 03/12/2012    Priority: Low   Past Surgical History:  Procedure Laterality Date  . ABDOMINAL AORTIC ENDOVASCULAR STENT GRAFT  04/13/2017   w/EMBOLIZATION LEFT HYPOGASTRIC USING INTERLOCK-35 DETACHABLE COIL/notes 04/13/2017  . ABDOMINAL AORTIC ENDOVASCULAR STENT GRAFT N/A 04/13/2017   Procedure: ABDOMINAL AORTIC ENDOVASCULAR STENT GRAFT;  Surgeon: Rosetta Posner, MD;  Location: [facility];  Service: Vascular;  Laterality: N/A;  . BACK SURGERY    . BASAL CELL CARCINOMA EXCISION  X~ 9   "chest, back, left leg"  (04/13/2017)  . CARDIOVASCULAR STRESS TEST  Jan. 14,2014  . CATARACT EXTRACTION W/ INTRAOCULAR LENS  IMPLANT, BILATERAL Bilateral    L pupil larger after lens implant  . COLONOSCOPY    . EMBOLIZATION N/A 04/13/2017   Procedure: EMBOLIZATION LEFT HYPOGASTRIC USING INTERLOCK-35 DETACHABLE COIL;  Surgeon: Rosetta Posner, MD;  Location: [facility];  Service: Vascular;  Laterality: N/A;  . INGUINAL HERNIA REPAIR Right 2005  . MOHS SURGERY  09/2014; ~ 2014   BCC; nose and left ear  . POSTERIOR FUSION LUMBAR SPINE  10/15/1982   "the 2 lowest discs fused"    Family History  Problem Relation Age of Onset  . AAA (abdominal aortic aneurysm) Father        sounds like died of DIC- otherwise healthy  . Peripheral vascular disease Father        Aneurysm- Aorta  . AAA (abdominal aortic aneurysm) Brother        states multiple medicle issues but died of pulmonary fibrosis- was told smoking related  . Heart disease Brother 16       Before age 87  . Heart attack Brother        x2  . Alcohol abuse Brother        sober 25 years when he died  . Prostate cancer Brother   . Cancer Mother        Stomach    Medications- reviewed  and updated Current Outpatient Medications  Medication Sig Dispense Refill  . atorvastatin (LIPITOR) 20 MG tablet TAKE 1 TABLET BY MOUTH EVERY EVENING AT 6PM 90 tablet 4  . Cholecalciferol (VITAMIN D3) 2000 units capsule Take 4,000 Units by mouth daily.     . Multiple Vitamins-Minerals (CENTRUM SILVER PO) Take 1 tablet by mouth daily.     . Multiple Vitamins-Minerals (ICAPS AREDS FORMULA PO) Take by mouth.    . PSYLLIUM FIBER PO Take 6-8 tablets by mouth at bedtime.     No current facility-administered medications for this visit.    Allergies-reviewed and updated No Known Allergies  Social History   Social History Narrative   Married to wife Henok Delozier (patient of mine). 2 kids- 1 local. No grandkids and wont have them. No pets.       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 TM normal. Bridge of nose normal. Eyelids normal.  Neck: no thyromegaly or cervical lymphadenopathy  CV: slightly bradycardic, no murmurs rubs or gallops Lungs: CTAB no crackles, wheeze, rhonchi Abdomen: soft/nontender/nondistended/normal bowel sounds. No rebound or guarding.  Ext: no edema Skin: warm, dry Neuro: grossly normal, moves all extremities, blown pupil on left after prior cataract surgery   Assessment and Plan  85 y.o. male presenting for annual physical.  Health Maintenance counseling: 1. Anticipatory guidance: Patient counseled regarding regular dental exams q4 months, eye exams every two years nest appointment in April - very early macular degeneration,  avoiding smoking and second hand smoke , limiting alcohol to 2 beverages per day . Never drinks   2. Risk factor reduction:  Advised patient of need for regular exercise and diet rich and fruits and vegetables to reduce risk of heart attack and stroke. Exercise- walks up to 4 miles a day and strength training every other day 30 minutes. . Diet- tries to eat balanced diet.  He was intentional about gaining weight including muscle Wt Readings from Last 3 Encounters:  03/18/19 163 lb 3.2 oz (74 kg)  11/19/18 156 lb (70.8 kg)  12/14/17 152 lb (68.9 kg)  3. Immunizations/screenings/ancillary studies- had flu shot. Received first covid 19 vaccine! Discussed shingrix sometime after covid vaccine complete  Immunization History  Administered Date(s) Administered  . BCG 12/14/2017  . DT (Pediatric) 08/20/2017   . Fluad Quad(high Dose 65+) 12/13/2018  . Influenza, High Dose Seasonal PF 12/10/2015, 12/05/2016, 12/14/2017  . Influenza-Unspecified 12/01/2012, 11/28/2016  . Moderna SARS-COVID-2 Vaccination 03/15/2019  . PPD Test 12/14/2017  . Pneumococcal Conjugate-13 05/29/2016  . Pneumococcal Polysaccharide-23 08/07/2017  . Rabies, IM 08/20/2017, 08/23/2017, 09/03/2017  . Tdap 08/20/2017  4. Prostate cancer screening- no further screening based on age based recommendations per [place] PTF. No change in urinary symptoms Lab Results  Component Value Date   PSA 1.0 05/10/2015   5. Colon cancer screening - no recall due to age after March 25, 2016 per Dr. Oletta Lamas of  [facility]. Apparently asked him to do stool cards 2023 6. Skin cancer screening-followed by [facility] dermatology last seen in December- history of skin cancer. Advised regular sunscreen use. Denies worrisome, changing, or new skin lesions.  7. Never  smoker  Status of chronic or acute concerns   Hyperlipidemia-remains on atorvastatin.  Continue current medication.  Update lipid panel  Abdominal aortic aneurysm (AAA) without rupture ([place])- followed by vascular surgery in past. Last visit 11/19/2018. Largest 4.9 cm per note 11/19/2018- essentially unchanged and plan 1 year follow  Up  Rare reflux- sparing OTC  Recommended follow up: 1 year physical Future Appointments  Date Time Provider [place]  03/18/2019  2:30 PM Bernita Raisin, LPN [id number]   Lab/Order associations: Fasting   ICD-10-CM   1. Preventative health care  Z00.00 atorvastatin (LIPITOR) 20 MG tablet    CBC with Differential/Platelet    Comprehensive metabolic panel    Lipid panel  2. Hyperlipidemia, unspecified hyperlipidemia type  E78.5 atorvastatin (LIPITOR) 20 MG tablet    CBC with Differential/Platelet    Comprehensive metabolic panel    Lipid panel  3. Abdominal aortic aneurysm (AAA) without rupture ([facility])  I71.4   4. Macular degeneration, unspecified  laterality, unspecified type  H35.30     Meds ordered this encounter  Medications  . atorvastatin (LIPITOR) 20 MG tablet    Sig: TAKE 1 TABLET BY MOUTH EVERY EVENING AT 6PM    Dispense:  90 tablet    Refill:  4    Return precautions advised.  Garret Reddish, MD

## 2019-05-18 DIAGNOSIS — H35363 Drusen (degenerative) of macula, bilateral: Secondary | ICD-10-CM | POA: Diagnosis not present

## 2019-05-18 DIAGNOSIS — H3554 Dystrophies primarily involving the retinal pigment epithelium: Secondary | ICD-10-CM | POA: Diagnosis not present

## 2019-05-18 DIAGNOSIS — H524 Presbyopia: Secondary | ICD-10-CM | POA: Diagnosis not present

## 2019-05-18 DIAGNOSIS — H353131 Nonexudative age-related macular degeneration, bilateral, early dry stage: Secondary | ICD-10-CM | POA: Diagnosis not present

## 2019-05-18 DIAGNOSIS — H43813 Vitreous degeneration, bilateral: Secondary | ICD-10-CM | POA: Diagnosis not present

## 2019-05-18 DIAGNOSIS — Z961 Presence of intraocular lens: Secondary | ICD-10-CM | POA: Diagnosis not present

## 2019-05-18 DIAGNOSIS — H3562 Retinal hemorrhage, left eye: Secondary | ICD-10-CM | POA: Diagnosis not present

## 2019-05-18 DIAGNOSIS — H52203 Unspecified astigmatism, bilateral: Secondary | ICD-10-CM | POA: Diagnosis not present

## 2019-05-18 DIAGNOSIS — H35443 Age-related reticular degeneration of retina, bilateral: Secondary | ICD-10-CM | POA: Diagnosis not present

## 2019-05-25 ENCOUNTER — Encounter: Payer: Self-pay | Admitting: Family Medicine

## 2019-07-26 ENCOUNTER — Other Ambulatory Visit: Payer: Self-pay

## 2019-07-26 ENCOUNTER — Ambulatory Visit (INDEPENDENT_AMBULATORY_CARE_PROVIDER_SITE_OTHER): Payer: PPO | Admitting: Otolaryngology

## 2019-07-26 ENCOUNTER — Encounter (INDEPENDENT_AMBULATORY_CARE_PROVIDER_SITE_OTHER): Payer: Self-pay | Admitting: Otolaryngology

## 2019-07-26 VITALS — Temp 97.7°F

## 2019-07-26 DIAGNOSIS — H6122 Impacted cerumen, left ear: Secondary | ICD-10-CM | POA: Diagnosis not present

## 2019-07-26 DIAGNOSIS — R070 Pain in throat: Secondary | ICD-10-CM

## 2019-07-26 DIAGNOSIS — H9202 Otalgia, left ear: Secondary | ICD-10-CM

## 2019-07-26 NOTE — Progress Notes (Addendum)
HPI: Charles Stephenson is a 85 y.o. male who presents for evaluation of left ear discomfort and left-sided throat discomfort.  He is also has some pain on the left side of his neck from lifting 40 pound bags of planting soil.  He has had previous wax problems in his ears on the left side and had to have it suctioned. He has no difficulty eating or swallowing and no pain with eating or swallowing.  Past Medical History:  Diagnosis Date  . AAA (abdominal aortic aneurysm) ([place])   . Basal cell carcinoma   . Diverticulitis    3x through 18 months around 2012  . Hepatitis   . History of alcohol abuse    sober since age 76  . History of vertigo    december 2007   . Hypercholesterolemia   . Seasonal allergies    no rx   Past Surgical History:  Procedure Laterality Date  . ABDOMINAL AORTIC ENDOVASCULAR STENT GRAFT  04/13/2017   w/EMBOLIZATION LEFT HYPOGASTRIC USING INTERLOCK-35 DETACHABLE COIL/notes 04/13/2017  . ABDOMINAL AORTIC ENDOVASCULAR STENT GRAFT N/A 04/13/2017   Procedure: ABDOMINAL AORTIC ENDOVASCULAR STENT GRAFT;  Surgeon: Rosetta Posner, MD;  Location: [facility];  Service: Vascular;  Laterality: N/A;  . BACK SURGERY    . BASAL CELL CARCINOMA EXCISION  X~ 9   "chest, back, left leg" (04/13/2017)  . CARDIOVASCULAR STRESS TEST  Jan. 14,2014  . CATARACT EXTRACTION W/ INTRAOCULAR LENS  IMPLANT, BILATERAL Bilateral    L pupil larger after lens implant  . COLONOSCOPY    . EMBOLIZATION N/A 04/13/2017   Procedure: EMBOLIZATION LEFT HYPOGASTRIC USING INTERLOCK-35 DETACHABLE COIL;  Surgeon: Rosetta Posner, MD;  Location: [facility];  Service: Vascular;  Laterality: N/A;  . INGUINAL HERNIA REPAIR Right 2005  . MOHS SURGERY  09/2014; ~ 2014   BCC; nose and left ear  . POSTERIOR FUSION LUMBAR SPINE  10/15/1982   "the 2 lowest discs fused"   Social History   Socioeconomic History  . Marital status: Married    Spouse name: Not on file  . Number of children: 2  . Years of education: Not on file   . Highest education level: Not on file  Occupational History  . 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 Smoking status: Never Smoker  . Smokeless tobacco: Never Used  Substance and Sexual Activity  . Alcohol use: No    Comment: 04/13/2017 "recovering alcoholic; [id number]"  . Drug use: No  . Sexual activity: Not Currently  Other Topics Concern  . Not on file  Social History Narrative   Married to wife Ettore Erkkila (patient of mine). 2 kids- 1 local. No grandkids and wont have them. No pets.       Retired in 2008 from [employer] to [employer]: golfing 2-3 days a week, down to place in [place]- walk at [employer], walks 2.5 miles daily, service work      Lives in Minor Strain:   . Difficulty of Paying Living Expenses:   Food Insecurity:   . Worried About Charity fundraiser in the Last Year:   . Arboriculturist in the Last Year:   Transportation Needs:   . Film/video editor (Medical):   Marland Kitchen Lack of Transportation (Non-Medical):  Physical Activity:   . Days of Exercise per Week:   . Minutes of Exercise per Session:   Stress:   . Feeling of Stress :   Social Connections:   . Frequency of Communication with Friends and Family:   . Frequency of Social Gatherings with Friends and Family:   . Attends Religious Services:   . Active Member of Clubs or Organizations:   . Attends Archivist Meetings:   Marland Kitchen Marital Status:    Family History  Problem Relation Age of Onset  . AAA (abdominal aortic aneurysm) Father        sounds like died of DIC- otherwise healthy  . Peripheral vascular disease Father        Aneurysm- Aorta  . AAA (abdominal aortic aneurysm) Brother        states multiple medicle issues but died of pulmonary fibrosis- was told smoking related  . Heart disease Brother 4       Before age 80  .  Heart attack Brother        x2  . Alcohol abuse Brother        sober 25 years when he died  . Prostate cancer Brother   . Cancer Mother        Stomach   No Known Allergies Prior to Admission medications   Medication Sig Start Date End Date Taking? Authorizing Provider  atorvastatin (LIPITOR) 20 MG tablet TAKE 1 TABLET BY MOUTH EVERY EVENING AT 6PM 03/18/19  Yes Marin Olp, MD  Cholecalciferol (VITAMIN D3) 2000 units capsule Take 4,000 Units by mouth daily.    Yes [provider]  Multiple Vitamins-Minerals (CENTRUM SILVER PO) Take 1 tablet by mouth daily.    Yes [provider]  Multiple Vitamins-Minerals (ICAPS AREDS FORMULA PO) Take by mouth.   Yes [provider]  PSYLLIUM FIBER PO Take 6-8 tablets by mouth at bedtime.   Yes [provider]     Positive ROS: Otherwise negative  All other systems have been reviewed and were otherwise negative with the exception of those mentioned in the HPI and as above.  Physical Exam: Constitutional: Alert, well-appearing, no acute distress Ears: External ears without lesions or tenderness.  He had moderate wax buildup on the left side that was cleaned in the office using curettes.  The left ear canal left TM otherwise clear with good mobility on pneumatic otoscopy.  Hearing screening with a 512 1024 tuning fork revealed symmetric hearing in both ears with perhaps a mild hearing loss. Nasal: External nose without lesions. Septum with minimal deformity. Clear nasal passages bilaterally with no signs of infection. Oral: Lips and gums without lesions. Tongue and palate mucosa without lesions. Posterior oropharynx clear.  Indirect laryngoscopy revealed a clear base of tongue vallecula and epiglottis.  Hypopharynx and larynx was clear bilaterally. Neck: No palpable adenopathy or masses.  No palpable masses on the left side of the neck.  The tenderness seems to be more muscular in nature. Respiratory: Breathing  comfortably  Skin: No facial/neck lesions or rash noted.  Cerumen impaction removal  Date/Time: 07/26/2019 4:38 PM Performed by: Rozetta Nunnery, MD Authorized by: Rozetta Nunnery, MD   Consent:    Consent obtained:  Verbal   Consent given by:  Patient   Risks discussed:  Pain and bleeding Procedure details:    Location:  L ear Post-procedure details:    Inspection:  TM intact and canal normal   Hearing quality:  Improved  Patient tolerance of procedure:  Tolerated well, no immediate complications Comments:     TMs are clear bilaterally.    Assessment: Clear upper airway examination with no evidence of infection or neoplasm.  Plan: Recommended treating this as a musculoskeletal problem with heat and NSAIDs. He will follow-up if he has any persistent or worsening of his pain.  Radene Journey, MD

## 2019-08-24 DIAGNOSIS — L821 Other seborrheic keratosis: Secondary | ICD-10-CM | POA: Diagnosis not present

## 2019-08-24 DIAGNOSIS — C44719 Basal cell carcinoma of skin of left lower limb, including hip: Secondary | ICD-10-CM | POA: Diagnosis not present

## 2019-08-24 DIAGNOSIS — L570 Actinic keratosis: Secondary | ICD-10-CM | POA: Diagnosis not present

## 2019-08-24 DIAGNOSIS — I788 Other diseases of capillaries: Secondary | ICD-10-CM | POA: Diagnosis not present

## 2019-08-24 DIAGNOSIS — Z85828 Personal history of other malignant neoplasm of skin: Secondary | ICD-10-CM | POA: Diagnosis not present

## 2019-08-24 DIAGNOSIS — D225 Melanocytic nevi of trunk: Secondary | ICD-10-CM | POA: Diagnosis not present

## 2019-08-24 DIAGNOSIS — L853 Xerosis cutis: Secondary | ICD-10-CM | POA: Diagnosis not present

## 2019-08-24 DIAGNOSIS — D485 Neoplasm of uncertain behavior of skin: Secondary | ICD-10-CM | POA: Diagnosis not present

## 2019-08-24 DIAGNOSIS — L718 Other rosacea: Secondary | ICD-10-CM | POA: Diagnosis not present

## 2019-09-07 DIAGNOSIS — C44719 Basal cell carcinoma of skin of left lower limb, including hip: Secondary | ICD-10-CM | POA: Diagnosis not present

## 2019-09-19 DIAGNOSIS — Z961 Presence of intraocular lens: Secondary | ICD-10-CM | POA: Diagnosis not present

## 2019-09-19 DIAGNOSIS — H353131 Nonexudative age-related macular degeneration, bilateral, early dry stage: Secondary | ICD-10-CM | POA: Diagnosis not present

## 2019-09-19 DIAGNOSIS — H35363 Drusen (degenerative) of macula, bilateral: Secondary | ICD-10-CM | POA: Diagnosis not present

## 2019-09-19 DIAGNOSIS — H43813 Vitreous degeneration, bilateral: Secondary | ICD-10-CM | POA: Diagnosis not present

## 2019-09-19 DIAGNOSIS — H35443 Age-related reticular degeneration of retina, bilateral: Secondary | ICD-10-CM | POA: Diagnosis not present

## 2019-09-19 DIAGNOSIS — H3554 Dystrophies primarily involving the retinal pigment epithelium: Secondary | ICD-10-CM | POA: Diagnosis not present

## 2019-12-08 DIAGNOSIS — Z23 Encounter for immunization: Secondary | ICD-10-CM | POA: Diagnosis not present

## 2019-12-22 DIAGNOSIS — R2231 Localized swelling, mass and lump, right upper limb: Secondary | ICD-10-CM | POA: Diagnosis not present

## 2019-12-29 DIAGNOSIS — M25561 Pain in right knee: Secondary | ICD-10-CM | POA: Diagnosis not present

## 2020-01-05 DIAGNOSIS — Z85828 Personal history of other malignant neoplasm of skin: Secondary | ICD-10-CM | POA: Diagnosis not present

## 2020-01-05 DIAGNOSIS — L570 Actinic keratosis: Secondary | ICD-10-CM | POA: Diagnosis not present

## 2020-01-06 ENCOUNTER — Other Ambulatory Visit: Payer: Self-pay

## 2020-01-06 DIAGNOSIS — I714 Abdominal aortic aneurysm, without rupture, unspecified: Secondary | ICD-10-CM

## 2020-01-16 ENCOUNTER — Ambulatory Visit (HOSPITAL_COMMUNITY)
Admission: RE | Admit: 2020-01-16 | Discharge: 2020-01-16 | Disposition: A | Discharge status: Home / Self Care | Payer: PPO | Source: Ambulatory Visit | Attending: Physician Assistant | Admitting: Physician Assistant

## 2020-01-16 ENCOUNTER — Ambulatory Visit: Payer: PPO | Admitting: Physician Assistant

## 2020-01-16 ENCOUNTER — Ambulatory Visit: Payer: PPO

## 2020-01-16 ENCOUNTER — Other Ambulatory Visit: Payer: Self-pay

## 2020-01-16 ENCOUNTER — Other Ambulatory Visit (HOSPITAL_COMMUNITY): Payer: PPO

## 2020-01-16 VITALS — BP 154/86 | HR 99 | Temp 98.4°F | Resp 20 | Ht 70.0 in | Wt 157.6 lb

## 2020-01-16 DIAGNOSIS — Z95828 Presence of other vascular implants and grafts: Secondary | ICD-10-CM

## 2020-01-16 DIAGNOSIS — I714 Abdominal aortic aneurysm, without rupture, unspecified: Secondary | ICD-10-CM

## 2020-01-16 NOTE — Progress Notes (Signed)
Office Note     CC:  follow up Requesting Provider:  Marin Olp, MD  HPI: Charles Stephenson is a 85 y.o. (01-Mar-1935) male who is s/p Gore Excluder stent graft repair of abdominal aortic aneurysm and internal iliac artery aneurysm coil embolization of left internal iliac branches on 04-13-17 by Dr. Donnetta Hutching.   Today, he has no complaints of episodic abdominal or back pain.  He walks several miles daily.  No claudication or rest pain.  Compliant with statin. No blood thinners/anti-platelet He is not diabetic and has no history of hypertension.  He does state when he comes to our office his blood pressure is elevated but does not find that to be the case at other physician's offices.  Past Medical History:  Diagnosis Date  . AAA (abdominal aortic aneurysm) ([place])   . Basal cell carcinoma   . Diverticulitis    3x through 18 months around 2012  . Hepatitis   . History of alcohol abuse    sober since age 40  . History of vertigo    december 2007   . Hypercholesterolemia   . Seasonal allergies    no rx    Past Surgical History:  Procedure Laterality Date  . ABDOMINAL AORTIC ENDOVASCULAR STENT GRAFT  04/13/2017   w/EMBOLIZATION LEFT HYPOGASTRIC USING INTERLOCK-35 DETACHABLE COIL/notes 04/13/2017  . ABDOMINAL AORTIC ENDOVASCULAR STENT GRAFT N/A 04/13/2017   Procedure: ABDOMINAL AORTIC ENDOVASCULAR STENT GRAFT;  Surgeon: Rosetta Posner, MD;  Location: [facility];  Service: Vascular;  Laterality: N/A;  . BACK SURGERY    . BASAL CELL CARCINOMA EXCISION  X~ 9   "chest, back, left leg" (04/13/2017)  . CARDIOVASCULAR STRESS TEST  Jan. 14,2014  . CATARACT EXTRACTION W/ INTRAOCULAR LENS  IMPLANT, BILATERAL Bilateral    L pupil larger after lens implant  . COLONOSCOPY    . EMBOLIZATION N/A 04/13/2017   Procedure: EMBOLIZATION LEFT HYPOGASTRIC USING INTERLOCK-35 DETACHABLE COIL;  Surgeon: Rosetta Posner, MD;  Location: [facility];  Service: Vascular;  Laterality: N/A;  . INGUINAL HERNIA REPAIR Right  2005  . MOHS SURGERY  09/2014; ~ 2014   BCC; nose and left ear  . POSTERIOR FUSION LUMBAR SPINE  10/15/1982   "the 2 lowest discs fused"    Social History   Socioeconomic History  . Marital status: Married    Spouse name: Not on file  . Number of children: 2  . Years of education: Not on file  . Highest education level: Not on file  Occupational History  . 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 Smoking status: Never Smoker  . Smokeless tobacco: Never Used  Vaping Use  . Vaping Use: Never used  Substance and Sexual Activity  . Alcohol use: No    Comment: 04/13/2017 "recovering alcoholic; 161/0960"  . Drug use: No  . Sexual activity: Not Currently  Other Topics Concern  . Not on file  Social History Narrative   Married to wife [name] (patient of mine). 2 kids- 1 local. No grandkids and wont have them. No pets.       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 Difficulty of Paying Living Expenses: Not on file  Food Insecurity:   . Worried About Charity fundraiser in the Last Year: Not on file  . Ran Out of Food in the Last Year: Not on file  Transportation Needs:   . Lack of Transportation (Medical): Not on file  . Lack of Transportation (Non-Medical): Not on file  Physical Activity:   . Days of Exercise per Week: Not on file  . Minutes of Exercise per Session: Not on file  Stress:   . Feeling of Stress : Not on file  Social Connections:   . Frequency of Communication with Friends and Family: Not on file  . Frequency of Social Gatherings with Friends and Family: Not on file  . Attends Religious Services: Not on file  . Active Member of Clubs or Organizations: Not  on file  . Attends Archivist Meetings: Not on file  . Marital Status: Not on file  Intimate Partner Violence:   . Fear of Current or Ex-Partner: Not on file  . Emotionally Abused: Not on file  . Physically Abused: Not on file  . Sexually Abused: Not on file   Family History  Problem Relation Age of Onset  . AAA (abdominal aortic aneurysm) Father        sounds like died of DIC- otherwise healthy  . Peripheral vascular disease Father        Aneurysm- Aorta  . AAA (abdominal aortic aneurysm) Brother        states multiple medicle issues but died of pulmonary fibrosis- was told smoking related  . Heart disease Brother 78       Before age 39  . Heart attack Brother        x2  . Alcohol abuse Brother        sober 25 years when he died  . Prostate cancer Brother   . Cancer Mother        Stomach    Current Outpatient Medications  Medication Sig Dispense Refill  . atorvastatin (LIPITOR) 20 MG tablet TAKE 1 TABLET BY MOUTH EVERY EVENING AT 6PM 90 tablet 4  . Cholecalciferol (VITAMIN D3) 2000 units capsule Take 4,000 Units by mouth daily.     . Multiple Vitamins-Minerals (CENTRUM SILVER PO) Take 1 tablet by mouth daily.     . Multiple Vitamins-Minerals (ICAPS AREDS FORMULA PO) Take by mouth.    . Propylene Glycol (SYSTANE BALANCE) 0.6 % SOLN 1 drop.    . PSYLLIUM FIBER PO Take 6-8 tablets by mouth at bedtime.     No current facility-administered medications for this visit.    No Known Allergies   REVIEW OF SYSTEMS:   [X]  denotes positive finding, [ ]  denotes negative finding Cardiac  Comments:  Chest pain or chest pressure:    Shortness of breath upon exertion:    Short of breath when lying flat:    Irregular heart rhythm:        Vascular    Pain in calf, thigh, or hip brought on by ambulation:    Pain in feet at night that wakes you up from your sleep:     Blood clot in your veins:    Leg swelling:         Pulmonary    Oxygen at home:    Productive cough:      Wheezing:         Neurologic    Sudden weakness in  arms or legs:     Sudden numbness in arms or legs:     Sudden onset of difficulty speaking or slurred speech:    Temporary loss of vision in one eye:     Problems with dizziness:         Gastrointestinal    Blood in stool:     Vomited blood:         Genitourinary    Burning when urinating:     Blood in urine:        Psychiatric    Major depression:         Hematologic    Bleeding problems:    Problems with blood clotting too easily:        Skin    Rashes or ulcers:        Constitutional    Fever or chills:      PHYSICAL EXAMINATION:  Vitals:   01/16/20 1028  BP: (!) 154/86  Pulse: 99  Resp: 20  Temp: 98.4 F (36.9 C)  TempSrc: Temporal  SpO2: 99%  Weight: 157 lb 9.6 oz (71.5 kg)  Height: 5\' 10"  (1.778 m)    General:  WDWN in NAD; vital signs documented above Gait: unaided; no ataxia HENT: WNL, normocephalic Pulmonary: normal non-labored breathing , without Rales, rhonchi,  wheezing Cardiac: regular HR, without  Murmurs without carotid bruit Abdomen: soft, NT Skin: with rashes Vascular Exam/Pulses: 2+ brachial, radial, femoral and dorsalis pedis and posterior tibial pulses bilaterally. Extremities: without ischemic changes, without Gangrene , without cellulitis; without open wounds;  Musculoskeletal: no muscle wasting or atrophy  Neurologic: A&O X 3;  No focal weakness or paresthesias are detected Psychiatric:  The pt has Normal affect.   Non-Invasive Vascular Imaging:   01/16/2020  Summary:  Abdominal Aorta: Patent endovascular aneurysm repair with evidence of  endoleak. The largest aortic diameter has increased compared to prior exam now 5.65 cm.   Previous diameter measurement was 4.9 cm obtained on 11/19/2018.    ASSESSMENT/PLAN:: 85 y.o. male here for follow up for endovascular repair of abdominal aortic aneurysm performed on April 13, 2017.  He is asymptomatic.  We discussed findings  of possible endoleak and increased diameter of his aorta on duplex study today.  Discussed with Dr. Trula Slade.  We will proceed with CTA and the patient chooses to follow-up with Dr. Donnetta Hutching at his [place] office.   Barbie Banner, PA-C [facility] [phone]  Clinic MD: Trula Slade

## 2020-01-24 ENCOUNTER — Other Ambulatory Visit: Payer: Self-pay

## 2020-01-24 DIAGNOSIS — I714 Abdominal aortic aneurysm, without rupture, unspecified: Secondary | ICD-10-CM

## 2020-02-10 ENCOUNTER — Ambulatory Visit
Admission: RE | Admit: 2020-02-10 | Discharge: 2020-02-10 | Disposition: A | Discharge status: Home / Self Care | Payer: PPO | Source: Ambulatory Visit | Attending: Vascular Surgery | Admitting: Vascular Surgery

## 2020-02-10 DIAGNOSIS — I714 Abdominal aortic aneurysm, without rupture, unspecified: Secondary | ICD-10-CM

## 2020-02-10 DIAGNOSIS — I745 Embolism and thrombosis of iliac artery: Secondary | ICD-10-CM | POA: Diagnosis not present

## 2020-02-10 DIAGNOSIS — I723 Aneurysm of iliac artery: Secondary | ICD-10-CM | POA: Diagnosis not present

## 2020-02-10 DIAGNOSIS — K5730 Diverticulosis of large intestine without perforation or abscess without bleeding: Secondary | ICD-10-CM | POA: Diagnosis not present

## 2020-02-10 MED ORDER — IOPAMIDOL (ISOVUE-370) INJECTION 76%
75.0000 mL | Freq: Once | INTRAVENOUS | Status: AC | PRN
  Administered 2020-02-10: 75 mL via INTRAVENOUS

## 2020-02-13 ENCOUNTER — Other Ambulatory Visit: Payer: Self-pay

## 2020-02-13 ENCOUNTER — Ambulatory Visit: Payer: PPO | Admitting: Vascular Surgery

## 2020-02-13 ENCOUNTER — Encounter: Payer: Self-pay | Admitting: Vascular Surgery

## 2020-02-13 VITALS — BP 154/86 | HR 54 | Temp 97.3°F | Resp 16 | Ht 70.0 in | Wt 157.0 lb

## 2020-02-13 DIAGNOSIS — Z95828 Presence of other vascular implants and grafts: Secondary | ICD-10-CM

## 2020-02-13 NOTE — H&P (View-Only) (Signed)
[facility]  Patient name: Charles Stephenson MRN: [id number] DOB: 1934/05/08 Sex: male  REASON FOR VISIT: Follow-up stent graft repair abdominal aortic aneurysm  HPI: Charles Stephenson is a 85 y.o. male here today for follow-up of stent graft repair abdominal aortic aneurysm.  He underwent repair on 04/13/2017.  Preoperative CT scan is suggested maximal diameter of 5.3 cm with some expansion.  His initial postoperative CT scan showed stable size with some decreased size in the sac.  He recently underwent repeat ultrasound and this suggested some increase in sac size and therefore he underwent CT scan and is here today for further discussion of this.  CT was on 02/10/2020.  He has had no symptoms referable to his aneurysm and remains quite active and healthy at his age of 85  Past Medical History:  Diagnosis Date  . AAA (abdominal aortic aneurysm) ([place])   . Basal cell carcinoma   . Diverticulitis    3x through 18 months around 2012  . Hepatitis   . History of alcohol abuse    sober since age 11  . History of vertigo    december 2007   . Hypercholesterolemia   . Seasonal allergies    no rx    Family History  Problem Relation Age of Onset  . AAA (abdominal aortic aneurysm) Father        sounds like died of DIC- otherwise healthy  . Peripheral vascular disease Father        Aneurysm- Aorta  . AAA (abdominal aortic aneurysm) Brother        states multiple medicle issues but died of pulmonary fibrosis- was told smoking related  . Stephenson disease Brother 21       Before age 45  . Stephenson attack Brother        x2  . Alcohol abuse Brother        sober 25 years when he died  . Prostate cancer Brother   . Cancer Mother        Stomach    SOCIAL HISTORY: Social History   Tobacco Use  . Smoking status: Never Smoker  . Smokeless tobacco: Never Used  Substance Use Topics  . Alcohol use: No    Comment: 04/13/2017  "recovering alcoholic; 353/2992"    No Known Allergies  Current Outpatient Medications  Medication Sig Dispense Refill  . atorvastatin (LIPITOR) 20 MG tablet TAKE 1 TABLET BY MOUTH EVERY EVENING AT 6PM 90 tablet 4  . Cholecalciferol (VITAMIN D3) 2000 units capsule Take 4,000 Units by mouth daily.     . Multiple Vitamins-Minerals (CENTRUM SILVER PO) Take 1 tablet by mouth daily.     . Multiple Vitamins-Minerals (ICAPS AREDS FORMULA PO) Take by mouth.    . Propylene Glycol (SYSTANE BALANCE) 0.6 % SOLN 1 drop.    . PSYLLIUM FIBER PO Take 6-8 tablets by mouth at bedtime.     No current facility-administered medications for this visit.    REVIEW OF SYSTEMS:  [X]  denotes positive finding, [ ]  denotes negative finding Cardiac  Comments:  Chest pain or chest pressure:    Shortness of breath upon exertion:    Short of breath when lying flat:    Irregular Stephenson rhythm:        Vascular    Pain in calf, thigh, or hip brought on by ambulation:    Pain in feet at night that wakes you up from your sleep:  Blood clot in your veins:    Leg swelling:           PHYSICAL EXAM: Vitals:   02/13/20 1104  BP: (!) 154/86  Pulse: (!) 54  Resp: 16  Temp: (!) 97.3 F (36.3 C)  TempSrc: Other (Comment)  SpO2: 99%  Weight: 157 lb (71.2 kg)  Height: 5\' 10"  (1.778 m)    GENERAL: The patient is a well-nourished male, in no acute distress. The vital signs are documented above. CARDIOVASCULAR: 2+ radial and 2+ femoral pulses.  I am able to palpate his aorta but there is no expansile nature to this. PULMONARY: There is good air exchange  MUSCULOSKELETAL: There are no major deformities or cyanosis. NEUROLOGIC: No focal weakness or paresthesias are detected. SKIN: There are no ulcers or rashes noted. PSYCHIATRIC: The patient has a normal affect.  DATA:  CT scan does show maximal diameter estimated at 5.7 cm which when comparing to his prior study with the same radiologist was felt to be 5.0 cm  in March 2019.  The radiologist interpretation is no evidence of type I endoleak.  He does have a known type II endoleak from distal lumbar vessels.  On my review there is certainly type II endoleak.  There is one area below the proximal attachment on the posterior left which may be a slight blush of contrast or may be calcium.  Will review further.  MEDICAL ISSUES: Had long discussion with the patient regarding the significance of his sac size expanding despite no obvious evidence of a type I endoleak.  Explained is very frequent to see type II endoleak's from patent lumbar or inferior mesenteric artery vessels.  This rarely causes sac size expansion but is somewhat difficult to treat if this is the case.  I will review his films further and make a final recommendation.  Suspect that we will see him again in 6 months with repeat CT scan.  I did discuss the option of attempting to coil lumbar vessels and also inject glue in the sac as options if he shows continued sac expansion.  I will contact him by telephone after reviewing his images further with my partners    Rosetta Posner, MD [facility] Tel [phone]

## 2020-02-13 NOTE — Progress Notes (Signed)
Vascular and Vein Specialist of [place]  Patient name: Charles Stephenson MRN: [id number] DOB: 1934-05-19 Sex: male  REASON FOR VISIT: Follow-up stent graft repair abdominal aortic aneurysm  HPI: Charles Stephenson is a 85 y.o. male here today for follow-up of stent graft repair abdominal aortic aneurysm.  He underwent repair on 04/13/2017.  Preoperative CT scan is suggested maximal diameter of 5.3 cm with some expansion.  His initial postoperative CT scan showed stable size with some decreased size in the sac.  He recently underwent repeat ultrasound and this suggested some increase in sac size and therefore he underwent CT scan and is here today for further discussion of this.  CT was on 02/10/2020.  He has had no symptoms referable to his aneurysm and remains quite active and healthy at his age of 11  Past Medical History:  Diagnosis Date  . AAA (abdominal aortic aneurysm) ([place])   . Basal cell carcinoma   . Diverticulitis    3x through 18 months around 2012  . Hepatitis   . History of alcohol abuse    sober since age 58  . History of vertigo    december 2007   . Hypercholesterolemia   . Seasonal allergies    no rx    Family History  Problem Relation Age of Onset  . AAA (abdominal aortic aneurysm) Father        sounds like died of DIC- otherwise healthy  . Peripheral vascular disease Father        Aneurysm- Aorta  . AAA (abdominal aortic aneurysm) Brother        states multiple medicle issues but died of pulmonary fibrosis- was told smoking related  . Heart disease Brother 73       Before age 83  . Heart attack Brother        x2  . Alcohol abuse Brother        sober 25 years when he died  . Prostate cancer Brother   . Cancer Mother        Stomach    SOCIAL HISTORY: Social History   Tobacco Use  . Smoking status: Never Smoker  . Smokeless tobacco: Never Used  Substance Use Topics  . Alcohol use: No    Comment: 04/13/2017  "recovering alcoholic; [id number]"    No Known Allergies  Current Outpatient Medications  Medication Sig Dispense Refill  . atorvastatin (LIPITOR) 20 MG tablet TAKE 1 TABLET BY MOUTH EVERY EVENING AT 6PM 90 tablet 4  . Cholecalciferol (VITAMIN D3) 2000 units capsule Take 4,000 Units by mouth daily.     . Multiple Vitamins-Minerals (CENTRUM SILVER PO) Take 1 tablet by mouth daily.     . Multiple Vitamins-Minerals (ICAPS AREDS FORMULA PO) Take by mouth.    . Propylene Glycol (SYSTANE BALANCE) 0.6 % SOLN 1 drop.    . PSYLLIUM FIBER PO Take 6-8 tablets by mouth at bedtime.     No current facility-administered medications for this visit.    REVIEW OF SYSTEMS:  [X]  denotes positive finding, [ ]  denotes negative finding Cardiac  Comments:  Chest pain or chest pressure:    Shortness of breath upon exertion:    Short of breath when lying flat:    Irregular heart rhythm:        Vascular    Pain in calf, thigh, or hip brought on by ambulation:    Pain in feet at night that wakes you up from your sleep:  Blood clot in your veins:    Leg swelling:           PHYSICAL EXAM: Vitals:   02/13/20 1104  BP: (!) 154/86  Pulse: (!) 54  Resp: 16  Temp: (!) 97.3 F (36.3 C)  TempSrc: Other (Comment)  SpO2: 99%  Weight: 157 lb (71.2 kg)  Height: 5\' 10"  (1.778 m)    GENERAL: The patient is a well-nourished male, in no acute distress. The vital signs are documented above. CARDIOVASCULAR: 2+ radial and 2+ femoral pulses.  I am able to palpate his aorta but there is no expansile nature to this. PULMONARY: There is good air exchange  MUSCULOSKELETAL: There are no major deformities or cyanosis. NEUROLOGIC: No focal weakness or paresthesias are detected. SKIN: There are no ulcers or rashes noted. PSYCHIATRIC: The patient has a normal affect.  DATA:  CT scan does show maximal diameter estimated at 5.7 cm which when comparing to his prior study with the same radiologist was felt to be 5.0 cm  in March 2019.  The radiologist interpretation is no evidence of type I endoleak.  He does have a known type II endoleak from distal lumbar vessels.  On my review there is certainly type II endoleak.  There is one area below the proximal attachment on the posterior left which may be a slight blush of contrast or may be calcium.  Will review further.  MEDICAL ISSUES: Had long discussion with the patient regarding the significance of his sac size expanding despite no obvious evidence of a type I endoleak.  Explained is very frequent to see type II endoleak's from patent lumbar or inferior mesenteric artery vessels.  This rarely causes sac size expansion but is somewhat difficult to treat if this is the case.  I will review his films further and make a final recommendation.  Suspect that we will see him again in 6 months with repeat CT scan.  I did discuss the option of attempting to coil lumbar vessels and also inject glue in the sac as options if he shows continued sac expansion.  I will contact him by telephone after reviewing his images further with my partners    Rosetta Posner, MD [facility] Vascular and Vein Specialists [facility] Tel [phone]

## 2020-02-16 ENCOUNTER — Telehealth: Payer: Self-pay | Admitting: Vascular Surgery

## 2020-02-16 NOTE — Telephone Encounter (Signed)
I discussed the patient CT scan by telephone.  Reviewed with several of my partners and also the device manufacturer.  All are in agreement that there does appear to be a blush of contrast posterior left lateral to the proximal attachment site.  There is no obvious communication between this and the blush in the aorta but this is certainly concerning.  I have recommended that the patient be taken to the operating room for arteriography and possible aortic cuff.  Discussed the procedure in detail and the patient understands.  We will coordinate this at his earliest convenience

## 2020-02-17 ENCOUNTER — Other Ambulatory Visit: Payer: Self-pay

## 2020-02-28 NOTE — Progress Notes (Signed)
[id number] - [place], [place] - [address] [place] [address] Phone: [phone] Fax: [phone]  [facility] - [place], [place] - [address] [place] [address] Phone: [phone] Fax: [phone]  [id number] - [place] [facility] - [address] [place] [address] Phone: [phone] Fax: [phone]      Your procedure is scheduled on January 5  Report to [facility] Main Entrance "A" at 0850 A.M., and check in at the Admitting office.  Call this number if you have problems the morning of surgery:  [phone]  Call [phone] if you have any questions prior to your surgery date Monday-Friday 8am-4pm    Remember:  Do not eat or drink after midnight the night before your surgery    Take these medicines the morning of surgery with A SIP OF WATER  atorvastatin (LIPITOR) loratadine (CLARITIN)  As of today, STOP taking any Aspirin (unless otherwise instructed by your surgeon) Aleve, Naproxen, Ibuprofen, Motrin, Advil, Goody's, BC's, all herbal medications, fish oil, and all vitamins.                      Do not wear jewelry            Do not wear lotions, powders, colognes, or deodorant.             Men may shave face and neck.            Do not bring valuables to the hospital.            [facility] is not responsible for any belongings or valuables.  Do NOT Smoke (Tobacco/Vaping) or drink Alcohol 24 hours prior to your procedure If you use a CPAP at night, you may bring all equipment for your overnight stay.   Contacts, glasses, dentures or bridgework may not be worn into surgery.      For patients admitted to the hospital, discharge time will be determined by your treatment team.   Patients discharged the day of surgery will not be allowed to drive home, and someone needs to stay with them for 24 hours.    Special instructions:   [facility]- Preparing For Surgery  Before surgery, you can play an important role. Because skin is not sterile, your skin needs to be as free of germs as possible. You can reduce the number of germs on your skin by washing with CHG (chlorahexidine gluconate) Soap before surgery.  CHG is an antiseptic cleaner which kills germs and bonds with the skin to continue killing germs even after washing.    Oral Hygiene is also important to reduce your risk of infection.  Remember - BRUSH YOUR TEETH THE MORNING OF SURGERY WITH YOUR REGULAR TOOTHPASTE  Please do not use if you have an allergy to CHG or antibacterial soaps. If your skin becomes reddened/irritated stop using the CHG.  Do not shave (including legs and underarms) for at least 48 hours prior to first CHG shower. It is OK to shave your face.  Please follow these instructions carefully.   1. Shower the NIGHT BEFORE SURGERY and the MORNING OF SURGERY with CHG Soap.   2. If you chose to wash your hair, wash your hair first as usual with your normal shampoo.  3. After you shampoo, rinse your hair and body thoroughly to remove the shampoo.  4. Use CHG as you would any other liquid  soap. You can apply CHG directly to the skin and wash gently with a scrungie or a clean washcloth.   5. Apply the CHG Soap to your body ONLY FROM THE NECK DOWN.  Do not use on open wounds or open sores. Avoid contact with your eyes, ears, mouth and genitals (private parts). Wash Face and genitals (private parts)  with your normal soap.   6. Wash thoroughly, paying special attention to the area where your surgery will be performed.  7. Thoroughly rinse your body with warm water from the neck down.  8. DO NOT shower/wash with your normal soap after using and rinsing off the CHG Soap.  9. Pat yourself dry with a CLEAN TOWEL.  10. Wear CLEAN PAJAMAS to bed the night before surgery  11. Place CLEAN SHEETS on your bed the night of your first shower and DO NOT SLEEP WITH  PETS.   Day of Surgery: Wear Clean/Comfortable clothing the morning of surgery Do not apply any deodorants/lotions.   Remember to brush your teeth WITH YOUR REGULAR TOOTHPASTE.   Please read over the following fact sheets that you were given.

## 2020-02-29 ENCOUNTER — Encounter (HOSPITAL_COMMUNITY): Payer: Self-pay

## 2020-02-29 ENCOUNTER — Encounter (HOSPITAL_COMMUNITY)
Admission: RE | Admit: 2020-02-29 | Discharge: 2020-02-29 | Disposition: A | Discharge status: Home / Self Care | Payer: PPO | Source: Ambulatory Visit | Attending: Vascular Surgery | Admitting: Vascular Surgery

## 2020-02-29 ENCOUNTER — Other Ambulatory Visit: Payer: Self-pay

## 2020-02-29 DIAGNOSIS — Z01818 Encounter for other preprocedural examination: Secondary | ICD-10-CM | POA: Insufficient documentation

## 2020-02-29 LAB — BLOOD GAS, ARTERIAL
Acid-Base Excess: 1.1 mmol/L (ref 0.0–2.0)
Bicarbonate: 25.2 mmol/L (ref 20.0–28.0)
FIO2: 21
O2 Saturation: 96.2 %
Patient temperature: 37
pCO2 arterial: 40.7 mmHg (ref 32.0–48.0)
pH, Arterial: 7.409 (ref 7.350–7.450)
pO2, Arterial: 83.7 mmHg (ref 83.0–108.0)

## 2020-02-29 LAB — COMPREHENSIVE METABOLIC PANEL
ALT: 23 U/L (ref 0–44)
AST: 24 U/L (ref 15–41)
Albumin: 4 g/dL (ref 3.5–5.0)
Alkaline Phosphatase: 55 U/L (ref 38–126)
Anion gap: 10 (ref 5–15)
BUN: 13 mg/dL (ref 8–23)
CO2: 22 mmol/L (ref 22–32)
Calcium: 9.4 mg/dL (ref 8.9–10.3)
Chloride: 104 mmol/L (ref 98–111)
Creatinine, Ser: 0.79 mg/dL (ref 0.61–1.24)
GFR, Estimated: >60 mL/min (ref >60)
Glucose, Bld: 92 mg/dL (ref 70–99)
Potassium: 4.3 mmol/L (ref 3.5–5.1)
Sodium: 136 mmol/L (ref 135–145)
Total Bilirubin: 1.1 mg/dL (ref 0.3–1.2)
Total Protein: 7.1 g/dL (ref 6.5–8.1)

## 2020-02-29 LAB — SURGICAL PCR SCREEN
MRSA, PCR: NEGATIVE
Staphylococcus aureus: NEGATIVE

## 2020-02-29 LAB — CBC
HCT: 43.4 % (ref 39.0–52.0)
Hemoglobin: 14.2 g/dL (ref 13.0–17.0)
MCH: 30.3 pg (ref 26.0–34.0)
MCHC: 32.7 g/dL (ref 30.0–36.0)
MCV: 92.7 fL (ref 80.0–100.0)
Platelets: 159 10*3/uL (ref 150–400)
RBC: 4.68 MIL/uL (ref 4.22–5.81)
RDW: 14.2 % (ref 11.5–15.5)
WBC: 6.6 10*3/uL (ref 4.0–10.5)
nRBC: 0 % (ref 0.0–0.2)

## 2020-02-29 LAB — URINALYSIS, ROUTINE W REFLEX MICROSCOPIC
Bilirubin Urine: NEGATIVE
Glucose, UA: NEGATIVE mg/dL
Hgb urine dipstick: NEGATIVE
Ketones, ur: NEGATIVE mg/dL
Leukocytes,Ua: NEGATIVE
Nitrite: NEGATIVE
Protein, ur: NEGATIVE mg/dL
Specific Gravity, Urine: <1.005 — ABNORMAL LOW (ref 1.005–1.030)
pH: 6.5 (ref 5.0–8.0)

## 2020-02-29 LAB — TYPE AND SCREEN
ABO/RH(D): O POS
Antibody Screen: NEGATIVE

## 2020-02-29 LAB — PROTIME-INR
INR: 1.1 (ref 0.8–1.2)
Prothrombin Time: 14.1 seconds (ref 11.4–15.2)

## 2020-02-29 LAB — APTT: aPTT: 32 seconds (ref 24–36)

## 2020-02-29 NOTE — Progress Notes (Signed)
PCP - Dr. Tana Conch Cardiologist - Dr. Lorine Bears (last seen a couple years ago, no longer sees him) Vascular - Dr. Tawanna Cooler Early  PPM/ICD - denies  Chest x-ray - N/A EKG - 02/29/2020 Stress Test - 04/16/15 ECHO - denies Cardiac Cath - denies  Sleep Study - denies CPAP - N/A  DM: denies  Blood Thinner Instructions: N/A Aspirin Instructions: N/A  ERAS Protcol -No  COVID TEST- Scheduled for 03/06/20. Patient verbalized understanding of self-quarantine instructions, appointment time and place.  Anesthesia review: YES, AAA history, abnormal hx  Patient denies shortness of breath, fever, cough and chest pain at PAT appointment  All instructions explained to the patient, with a verbal understanding of the material. Patient agrees to go over the instructions while at home for a better understanding. Patient also instructed to self quarantine after being tested for COVID-19. The opportunity to ask questions was provided.

## 2020-03-01 ENCOUNTER — Telehealth: Payer: Self-pay

## 2020-03-01 NOTE — Telephone Encounter (Signed)
Patient called to ask whether he could go see his dentist Monday prior to surgery for a routine cleaning. Assured him this was fine, and also that we initiate pre-op antibiotics. Patient verbalized understanding.

## 2020-03-05 NOTE — Progress Notes (Signed)
Anesthesia Chart Review:  Case: [id number] Date/Time: 03/07/20 1041   Procedure: AORTOGRAM Possible Aortic Cuff for EVAR (N/A )   Anesthesia type: General   Pre-op diagnosis: Endoleak   [facility] ROOM 16 / [facility] OR   Surgeons: Larina Earthly, MD      DISCUSSION: Patient is an 86 year old male scheduled for the above procedure.  History includes never smoker, AAA (s/p EVAR AAA/IIA with coil embolization of LIA branches 04/13/17), hypercholesterolemia, alcohol abuse (sober since age 80 years old), vertigo (2007), diverticulitis (~ 2012), lumbar fusion (1984).   Labs acceptable. EKG stable. Low risk stress test in 2014 and 2017. As needed cardiology follow-up recommended in 2019 by Dr. Kirke Corin. Denied chest pain and SOB at PAT RN visit.   He is on Icaps AREDS for macular degeneration and did not want to hold for surgery. I sent a staff message to Dr. Arbie Cookey also with nurses Trinna Post and [place] [facility].   Preoperative COVID-19 test scheduled for 03/06/20. Anesthesia team to evaluate on the day of surgery.    VS: BP (!) 156/82   Pulse (!) 56   Temp 36.4 C (Oral)   Resp 17   Ht 5\' 10"  (1.778 m)   Wt 72 kg   SpO2 98%   BMI 22.79 kg/m    PROVIDERS: , MD is PCP  - He is not routinely followed by a cardiologist, but was evaluated by Shelva Majestic, MD is 2014 and Marca Ancona, MD in 2017 with low risk stress tests. Last on 04/07/17 for pre-operative evaluation before EVAR with as needed follow-up.   LABS: Labs reviewed: Acceptable for surgery. (all labs ordered are listed, but only abnormal results are displayed)  Labs Reviewed  URINALYSIS, ROUTINE W REFLEX MICROSCOPIC - Abnormal; Notable for the following components:      Result Value   Specific Gravity, Urine <1.005 (*)    All other components within normal limits  SURGICAL PCR SCREEN  CBC  COMPREHENSIVE METABOLIC PANEL  PROTIME-INR  APTT  BLOOD GAS, ARTERIAL  TYPE AND SCREEN     IMAGES: CTA abd/pelvis  02/10/20: IMPRESSION: 1. Patent infrarenal bifurcated stent graft with persistent type 2 endoleak, slight increase in native sac diameter to 5.7 cm. 2. Stable 2.4 cm saccular partially thrombosed aneurysm of the right internal iliac artery. 3. 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 4. Colonic diverticulosis. - Aortic Atherosclerosis (ICD10-I70.0).   EKG: 02/29/20: Ventricular rate 52 bpm Sinus bradycardia with Premature atrial complexes Septal infarct , age undetermined No significant change since last tracing Confirmed by Croitoru, Mihai [phone]) on 02/29/2020 6:53:20 PM   CV: Carotid 03/02/2020 06/05/16: Impression: Less than 40% bilateral ICA stenosis.  Nuclear stress test 04/16/15:  Nuclear stress EF: 51%.  Defect 1: There is a medium defect of mild severity present in the basal inferoseptal, basal inferior and mid inferior location.  This is a low risk study.  The left ventricular ejection fraction is mildly decreased (45-54%).    Past Medical History:  Diagnosis Date  . AAA (abdominal aortic aneurysm) ([facility])   . Basal cell carcinoma    02/09/2020: patient denies  . Diverticulitis    3x through 18 months around 2012  . Hepatitis    02/29/2020: patient denies hx  . History of alcohol abuse    sober since age 17  . History of vertigo    december 2007   . Hypercholesterolemia   . Inguinal hernia 2005   left side per patient  .  Seasonal allergies    no rx    Past Surgical History:  Procedure Laterality Date  . ABDOMINAL AORTIC ENDOVASCULAR STENT GRAFT  04/13/2017   w/EMBOLIZATION LEFT HYPOGASTRIC USING INTERLOCK-35 DETACHABLE COIL/notes 04/13/2017  . ABDOMINAL AORTIC ENDOVASCULAR STENT GRAFT N/A 04/13/2017   Procedure: ABDOMINAL AORTIC ENDOVASCULAR STENT GRAFT;  Surgeon: Rosetta Posner, MD;  Location: [facility];  Service: Vascular;  Laterality: N/A;  . BACK SURGERY    . BASAL CELL CARCINOMA EXCISION  X~ 9   "chest, back, left leg" (04/13/2017)  .  CARDIOVASCULAR STRESS TEST  Jan. 14,2014  . CATARACT EXTRACTION W/ INTRAOCULAR LENS  IMPLANT, BILATERAL Bilateral    L pupil larger after lens implant  . COLONOSCOPY    . EMBOLIZATION N/A 04/13/2017   Procedure: EMBOLIZATION LEFT HYPOGASTRIC USING INTERLOCK-35 DETACHABLE COIL;  Surgeon: Rosetta Posner, MD;  Location: [place];  Service: Vascular;  Laterality: N/A;  . INGUINAL HERNIA REPAIR Right 2005  . MOHS SURGERY  09/2014; ~ 2014   BCC; nose and left ear  . POSTERIOR FUSION LUMBAR SPINE  10/15/1982   "the 2 lowest discs fused"    MEDICATIONS: . atorvastatin (LIPITOR) 20 MG tablet  . loratadine (CLARITIN) 10 MG tablet  . METAMUCIL FIBER PO  . Multiple Vitamins-Minerals (CENTRUM SILVER PO)  . Multiple Vitamins-Minerals (ICAPS AREDS FORMULA PO)  . Sulfacetamide Sodium-Sulfur 9-4.5 % LIQD  . VITAMIN D PO   No current facility-administered medications for this encounter.    Myra Gianotti, PA-C Surgical Short Stay/Anesthesiology [facility] Phone [phone] [facility] Phone [phone] 03/05/2020 11:07 AM

## 2020-03-05 NOTE — Anesthesia Preprocedure Evaluation (Addendum)
Anesthesia Evaluation  Patient identified by MRN, date of birth, ID band Patient awake    Reviewed: Allergy & Precautions, NPO status , Patient's Chart, lab work & pertinent test results  History of Anesthesia Complications Negative for: history of anesthetic complications  Airway Mallampati: II  TM Distance: >3 FB Neck ROM: Full    Dental  (+) Caps, Dental Advisory Given   Pulmonary neg pulmonary ROS,  03/06/2020 SARS coronavirus NEG   breath sounds clear to auscultation       Cardiovascular (-) hypertension(-) angina+ Peripheral Vascular Disease (AAA: s/p EVAR)   Rhythm:Regular Rate:Normal     Neuro/Psych negative neurological ROS     GI/Hepatic negative GI ROS, Neg liver ROS,   Endo/Other  negative endocrine ROS  Renal/GU negative Renal ROS     Musculoskeletal   Abdominal   Peds  Hematology negative hematology ROS (+)   Anesthesia Other Findings   Reproductive/Obstetrics                            Anesthesia Physical Anesthesia Plan  ASA: III  Anesthesia Plan: General   Post-op Pain Management:    Induction: Intravenous  PONV Risk Score and Plan: 2 and Ondansetron and Dexamethasone  Airway Management Planned: Oral ETT  Additional Equipment: Arterial line  Intra-op Plan:   Post-operative Plan: Extubation in OR  Informed Consent: I have reviewed the patients History and Physical, chart, labs and discussed the procedure including the risks, benefits and alternatives for the proposed anesthesia with the patient or authorized representative who has indicated his/her understanding and acceptance.     Dental advisory given  Plan Discussed with: CRNA and Surgeon  Anesthesia Plan Comments: (PAT note written 03/05/2020 by Shonna Chock, PA-C. )      Anesthesia Quick Evaluation

## 2020-03-06 ENCOUNTER — Other Ambulatory Visit (HOSPITAL_COMMUNITY)
Admission: RE | Admit: 2020-03-06 | Discharge: 2020-03-06 | Disposition: A | Discharge status: Home / Self Care | Payer: PPO | Source: Ambulatory Visit | Attending: Vascular Surgery | Admitting: Vascular Surgery

## 2020-03-06 DIAGNOSIS — Z8042 Family history of malignant neoplasm of prostate: Secondary | ICD-10-CM | POA: Diagnosis not present

## 2020-03-06 DIAGNOSIS — T82330A Leakage of aortic (bifurcation) graft (replacement), initial encounter: Secondary | ICD-10-CM | POA: Diagnosis not present

## 2020-03-06 DIAGNOSIS — Z01818 Encounter for other preprocedural examination: Secondary | ICD-10-CM | POA: Insufficient documentation

## 2020-03-06 DIAGNOSIS — Z8249 Family history of ischemic heart disease and other diseases of the circulatory system: Secondary | ICD-10-CM | POA: Diagnosis not present

## 2020-03-06 DIAGNOSIS — I714 Abdominal aortic aneurysm, without rupture: Secondary | ICD-10-CM | POA: Diagnosis present

## 2020-03-06 DIAGNOSIS — M47814 Spondylosis without myelopathy or radiculopathy, thoracic region: Secondary | ICD-10-CM | POA: Diagnosis not present

## 2020-03-06 DIAGNOSIS — I9789 Other postprocedural complications and disorders of the circulatory system, not elsewhere classified: Secondary | ICD-10-CM | POA: Diagnosis present

## 2020-03-06 DIAGNOSIS — H3530 Unspecified macular degeneration: Secondary | ICD-10-CM | POA: Diagnosis present

## 2020-03-06 DIAGNOSIS — E7800 Pure hypercholesterolemia, unspecified: Secondary | ICD-10-CM | POA: Diagnosis present

## 2020-03-06 DIAGNOSIS — Z85828 Personal history of other malignant neoplasm of skin: Secondary | ICD-10-CM | POA: Diagnosis not present

## 2020-03-06 DIAGNOSIS — Z95828 Presence of other vascular implants and grafts: Secondary | ICD-10-CM | POA: Diagnosis not present

## 2020-03-06 DIAGNOSIS — Z20822 Contact with and (suspected) exposure to covid-19: Secondary | ICD-10-CM | POA: Diagnosis present

## 2020-03-06 DIAGNOSIS — T82898A Other specified complication of vascular prosthetic devices, implants and grafts, initial encounter: Secondary | ICD-10-CM | POA: Diagnosis not present

## 2020-03-06 LAB — SARS CORONAVIRUS 2 (TAT 6-24 HRS): SARS Coronavirus 2: NEGATIVE

## 2020-03-07 ENCOUNTER — Inpatient Hospital Stay (HOSPITAL_COMMUNITY)
Admission: RE | Admit: 2020-03-07 | Discharge: 2020-03-08 | DRG: 269 | Disposition: A | Discharge status: Home / Self Care | Payer: PPO | Attending: Vascular Surgery | Admitting: Vascular Surgery

## 2020-03-07 ENCOUNTER — Inpatient Hospital Stay (HOSPITAL_COMMUNITY): Payer: PPO

## 2020-03-07 ENCOUNTER — Inpatient Hospital Stay (HOSPITAL_COMMUNITY): Payer: PPO | Admitting: Certified Registered"

## 2020-03-07 ENCOUNTER — Encounter (HOSPITAL_COMMUNITY): Payer: Self-pay | Admitting: Vascular Surgery

## 2020-03-07 ENCOUNTER — Inpatient Hospital Stay (HOSPITAL_COMMUNITY): Payer: PPO | Admitting: Vascular Surgery

## 2020-03-07 ENCOUNTER — Other Ambulatory Visit: Payer: Self-pay

## 2020-03-07 ENCOUNTER — Encounter (HOSPITAL_COMMUNITY)
Admission: RE | Disposition: A | Discharge status: Home / Self Care | Payer: Self-pay | Source: Home / Self Care | Attending: Vascular Surgery

## 2020-03-07 DIAGNOSIS — Z9889 Other specified postprocedural states: Secondary | ICD-10-CM

## 2020-03-07 DIAGNOSIS — Z8042 Family history of malignant neoplasm of prostate: Secondary | ICD-10-CM | POA: Diagnosis not present

## 2020-03-07 DIAGNOSIS — I714 Abdominal aortic aneurysm, without rupture: Secondary | ICD-10-CM | POA: Diagnosis present

## 2020-03-07 DIAGNOSIS — H3530 Unspecified macular degeneration: Secondary | ICD-10-CM | POA: Diagnosis present

## 2020-03-07 DIAGNOSIS — Z95828 Presence of other vascular implants and grafts: Secondary | ICD-10-CM | POA: Diagnosis not present

## 2020-03-07 DIAGNOSIS — Z8679 Personal history of other diseases of the circulatory system: Secondary | ICD-10-CM

## 2020-03-07 DIAGNOSIS — Z20822 Contact with and (suspected) exposure to covid-19: Secondary | ICD-10-CM | POA: Diagnosis present

## 2020-03-07 DIAGNOSIS — T82898A Other specified complication of vascular prosthetic devices, implants and grafts, initial encounter: Secondary | ICD-10-CM | POA: Diagnosis not present

## 2020-03-07 DIAGNOSIS — I9789 Other postprocedural complications and disorders of the circulatory system, not elsewhere classified: Secondary | ICD-10-CM | POA: Diagnosis present

## 2020-03-07 DIAGNOSIS — Z85828 Personal history of other malignant neoplasm of skin: Secondary | ICD-10-CM

## 2020-03-07 DIAGNOSIS — E7800 Pure hypercholesterolemia, unspecified: Secondary | ICD-10-CM | POA: Diagnosis present

## 2020-03-07 DIAGNOSIS — M47814 Spondylosis without myelopathy or radiculopathy, thoracic region: Secondary | ICD-10-CM | POA: Diagnosis not present

## 2020-03-07 DIAGNOSIS — Z8249 Family history of ischemic heart disease and other diseases of the circulatory system: Secondary | ICD-10-CM | POA: Diagnosis not present

## 2020-03-07 HISTORY — PX: OTHER SURGICAL HISTORY: SHX169

## 2020-03-07 HISTORY — PX: AORTOGRAM: SHX6300

## 2020-03-07 HISTORY — PX: ULTRASOUND GUIDANCE FOR VASCULAR ACCESS: SHX6516

## 2020-03-07 LAB — MAGNESIUM: Magnesium: 1.9 mg/dL (ref 1.7–2.4)

## 2020-03-07 LAB — BASIC METABOLIC PANEL
Anion gap: 9 (ref 5–15)
BUN: 9 mg/dL (ref 8–23)
CO2: 25 mmol/L (ref 22–32)
Calcium: 8.9 mg/dL (ref 8.9–10.3)
Chloride: 104 mmol/L (ref 98–111)
Creatinine, Ser: 0.7 mg/dL (ref 0.61–1.24)
GFR, Estimated: >60 mL/min (ref >60)
Glucose, Bld: 102 mg/dL — ABNORMAL HIGH (ref 70–99)
Potassium: 3.8 mmol/L (ref 3.5–5.1)
Sodium: 138 mmol/L (ref 135–145)

## 2020-03-07 LAB — CBC
HCT: 40.5 % (ref 39.0–52.0)
Hemoglobin: 13.2 g/dL (ref 13.0–17.0)
MCH: 30.3 pg (ref 26.0–34.0)
MCHC: 32.6 g/dL (ref 30.0–36.0)
MCV: 92.9 fL (ref 80.0–100.0)
Platelets: 149 10*3/uL — ABNORMAL LOW (ref 150–400)
RBC: 4.36 MIL/uL (ref 4.22–5.81)
RDW: 13.8 % (ref 11.5–15.5)
WBC: 6.2 10*3/uL (ref 4.0–10.5)
nRBC: 0 % (ref 0.0–0.2)

## 2020-03-07 LAB — APTT: aPTT: 33 seconds (ref 24–36)

## 2020-03-07 LAB — PROTIME-INR
INR: 1.2 (ref 0.8–1.2)
Prothrombin Time: 14.4 seconds (ref 11.4–15.2)

## 2020-03-07 SURGERY — AORTOGRAM
Anesthesia: General | Site: Groin | Laterality: Right

## 2020-03-07 MED ORDER — IODIXANOL 320 MG/ML IV SOLN
INTRAVENOUS | Status: DC | PRN
  Administered 2020-03-07: 50.9 mL via INTRA_ARTERIAL

## 2020-03-07 MED ORDER — GLYCOPYRROLATE PF 0.2 MG/ML IJ SOSY
PREFILLED_SYRINGE | INTRAMUSCULAR | Status: AC
  Filled 2020-03-07: qty 1

## 2020-03-07 MED ORDER — ACETAMINOPHEN 325 MG PO TABS: 325.0000 mg | ORAL_TABLET | ORAL | Status: DC | PRN

## 2020-03-07 MED ORDER — HYDROMORPHONE HCL 1 MG/ML IJ SOLN
0.2500 mg | INTRAMUSCULAR | Status: DC | PRN
Start: 2020-03-07 — End: 2020-03-07

## 2020-03-07 MED ORDER — ROCURONIUM BROMIDE 10 MG/ML (PF) SYRINGE
PREFILLED_SYRINGE | INTRAVENOUS | Status: AC
  Filled 2020-03-07: qty 10

## 2020-03-07 MED ORDER — CEFAZOLIN SODIUM-DEXTROSE 2-4 GM/100ML-% IV SOLN
2.0000 g | Freq: Three times a day (TID) | INTRAVENOUS | Status: AC
  Administered 2020-03-07 – 2020-03-08 (×2): 2 g via INTRAVENOUS
  Filled 2020-03-07 (×2): qty 100

## 2020-03-07 MED ORDER — MIDAZOLAM HCL 2 MG/2ML IJ SOLN: 0.5000 mg | Freq: Once | INTRAMUSCULAR | Status: DC | PRN

## 2020-03-07 MED ORDER — MAGNESIUM SULFATE 2 GM/50ML IV SOLN: 2.0000 g | Freq: Every day | INTRAVENOUS | Status: DC | PRN

## 2020-03-07 MED ORDER — ASPIRIN EC 81 MG PO TBEC
81.0000 mg | DELAYED_RELEASE_TABLET | Freq: Every day | ORAL | Status: DC
  Administered 2020-03-08: 81 mg via ORAL
  Filled 2020-03-07: qty 1

## 2020-03-07 MED ORDER — LACTATED RINGERS IV SOLN: INTRAVENOUS | Status: DC | PRN

## 2020-03-07 MED ORDER — ONDANSETRON HCL 4 MG/2ML IJ SOLN
INTRAMUSCULAR | Status: DC | PRN
  Administered 2020-03-07: 4 mg via INTRAVENOUS

## 2020-03-07 MED ORDER — EPHEDRINE 5 MG/ML INJ
INTRAVENOUS | Status: AC
  Filled 2020-03-07: qty 10

## 2020-03-07 MED ORDER — LACTATED RINGERS IV SOLN: INTRAVENOUS | Status: DC

## 2020-03-07 MED ORDER — MORPHINE SULFATE (PF) 2 MG/ML IV SOLN: 2.0000 mg | INTRAVENOUS | Status: DC | PRN

## 2020-03-07 MED ORDER — GUAIFENESIN-DM 100-10 MG/5ML PO SYRP: 15.0000 mL | ORAL_SOLUTION | ORAL | Status: DC | PRN

## 2020-03-07 MED ORDER — ACETAMINOPHEN 650 MG RE SUPP: 325.0000 mg | RECTAL | Status: DC | PRN

## 2020-03-07 MED ORDER — POLYETHYLENE GLYCOL 3350 17 G PO PACK: 17.0000 g | PACK | Freq: Every day | ORAL | Status: DC | PRN

## 2020-03-07 MED ORDER — ROCURONIUM BROMIDE 10 MG/ML (PF) SYRINGE
PREFILLED_SYRINGE | INTRAVENOUS | Status: DC | PRN
  Administered 2020-03-07: 60 mg via INTRAVENOUS

## 2020-03-07 MED ORDER — FENTANYL CITRATE (PF) 250 MCG/5ML IJ SOLN
INTRAMUSCULAR | Status: DC | PRN
  Administered 2020-03-07: 100 ug via INTRAVENOUS

## 2020-03-07 MED ORDER — POTASSIUM CHLORIDE CRYS ER 20 MEQ PO TBCR: 20.0000 meq | EXTENDED_RELEASE_TABLET | Freq: Every day | ORAL | Status: DC | PRN

## 2020-03-07 MED ORDER — CHLORHEXIDINE GLUCONATE CLOTH 2 % EX PADS: 6.0000 | MEDICATED_PAD | Freq: Once | CUTANEOUS | Status: DC

## 2020-03-07 MED ORDER — SODIUM CHLORIDE 0.9 % IV SOLN
INTRAVENOUS | Status: AC
  Filled 2020-03-07: qty 1.2

## 2020-03-07 MED ORDER — PANTOPRAZOLE SODIUM 40 MG PO TBEC
40.0000 mg | DELAYED_RELEASE_TABLET | Freq: Every day | ORAL | Status: DC
  Administered 2020-03-07: 40 mg via ORAL
  Filled 2020-03-07: qty 1

## 2020-03-07 MED ORDER — METOPROLOL TARTRATE 5 MG/5ML IV SOLN: 2.0000 mg | INTRAVENOUS | Status: DC | PRN

## 2020-03-07 MED ORDER — EPHEDRINE SULFATE-NACL 50-0.9 MG/10ML-% IV SOSY
PREFILLED_SYRINGE | INTRAVENOUS | Status: DC | PRN
  Administered 2020-03-07: 10 mg via INTRAVENOUS
  Administered 2020-03-07: 5 mg via INTRAVENOUS

## 2020-03-07 MED ORDER — LIDOCAINE 2% (20 MG/ML) 5 ML SYRINGE
INTRAMUSCULAR | Status: DC | PRN
  Administered 2020-03-07: 20 mg via INTRAVENOUS

## 2020-03-07 MED ORDER — DOCUSATE SODIUM 100 MG PO CAPS: 100.0000 mg | ORAL_CAPSULE | Freq: Every day | ORAL | Status: DC

## 2020-03-07 MED ORDER — PHENOL 1.4 % MT LIQD: 1.0000 | OROMUCOSAL | Status: DC | PRN

## 2020-03-07 MED ORDER — SODIUM CHLORIDE 0.9 % IV SOLN: INTRAVENOUS | Status: DC

## 2020-03-07 MED ORDER — CHLORHEXIDINE GLUCONATE 0.12 % MT SOLN
15.0000 mL | Freq: Once | OROMUCOSAL | Status: AC
  Administered 2020-03-07: 15 mL via OROMUCOSAL
  Filled 2020-03-07: qty 15

## 2020-03-07 MED ORDER — LABETALOL HCL 5 MG/ML IV SOLN: 10.0000 mg | INTRAVENOUS | Status: DC | PRN

## 2020-03-07 MED ORDER — HYDRALAZINE HCL 20 MG/ML IJ SOLN: 5.0000 mg | INTRAMUSCULAR | Status: DC | PRN

## 2020-03-07 MED ORDER — ALUM & MAG HYDROXIDE-SIMETH 200-200-20 MG/5ML PO SUSP: 15.0000 mL | ORAL | Status: DC | PRN

## 2020-03-07 MED ORDER — LIDOCAINE 2% (20 MG/ML) 5 ML SYRINGE
INTRAMUSCULAR | Status: AC
  Filled 2020-03-07: qty 5

## 2020-03-07 MED ORDER — ATORVASTATIN CALCIUM 10 MG PO TABS
20.0000 mg | ORAL_TABLET | Freq: Every day | ORAL | Status: DC
Start: 2020-03-07 — End: 2020-03-08
  Administered 2020-03-07: 20 mg via ORAL
  Filled 2020-03-07: qty 2

## 2020-03-07 MED ORDER — OXYCODONE HCL 5 MG/5ML PO SOLN: 5.0000 mg | Freq: Once | ORAL | Status: DC | PRN

## 2020-03-07 MED ORDER — SULFACETAMIDE SODIUM-SULFUR 9-4.5 % EX LIQD: 1.0000 "application " | CUTANEOUS | Status: DC

## 2020-03-07 MED ORDER — ORAL CARE MOUTH RINSE: 15.0000 mL | Freq: Once | OROMUCOSAL | Status: AC

## 2020-03-07 MED ORDER — PROPOFOL 10 MG/ML IV BOLUS
INTRAVENOUS | Status: DC | PRN
  Administered 2020-03-07: 80 mg via INTRAVENOUS
  Administered 2020-03-07: 50 mg via INTRAVENOUS

## 2020-03-07 MED ORDER — DEXAMETHASONE SODIUM PHOSPHATE 10 MG/ML IJ SOLN
INTRAMUSCULAR | Status: DC | PRN
  Administered 2020-03-07: 5 mg via INTRAVENOUS

## 2020-03-07 MED ORDER — SODIUM CHLORIDE 0.9 % IV SOLN: 500.0000 mL | Freq: Once | INTRAVENOUS | Status: DC | PRN

## 2020-03-07 MED ORDER — PHENYLEPHRINE HCL-NACL 10-0.9 MG/250ML-% IV SOLN
INTRAVENOUS | Status: DC | PRN
  Administered 2020-03-07: 30 ug/min via INTRAVENOUS

## 2020-03-07 MED ORDER — MEPERIDINE HCL 25 MG/ML IJ SOLN: 6.2500 mg | INTRAMUSCULAR | Status: DC | PRN

## 2020-03-07 MED ORDER — BISACODYL 5 MG PO TBEC: 5.0000 mg | DELAYED_RELEASE_TABLET | Freq: Every day | ORAL | Status: DC | PRN

## 2020-03-07 MED ORDER — CEFAZOLIN SODIUM-DEXTROSE 2-4 GM/100ML-% IV SOLN
2.0000 g | INTRAVENOUS | Status: AC
  Administered 2020-03-07: 2 g via INTRAVENOUS
  Filled 2020-03-07: qty 100

## 2020-03-07 MED ORDER — GLYCOPYRROLATE 0.2 MG/ML IJ SOLN
INTRAMUSCULAR | Status: DC | PRN
  Administered 2020-03-07 (×2): .1 mg via INTRAVENOUS

## 2020-03-07 MED ORDER — OXYCODONE-ACETAMINOPHEN 5-325 MG PO TABS: 1.0000 | ORAL_TABLET | ORAL | Status: DC | PRN

## 2020-03-07 MED ORDER — FENTANYL CITRATE (PF) 250 MCG/5ML IJ SOLN
INTRAMUSCULAR | Status: AC
  Filled 2020-03-07: qty 5

## 2020-03-07 MED ORDER — ONDANSETRON HCL 4 MG/2ML IJ SOLN
INTRAMUSCULAR | Status: AC
  Filled 2020-03-07: qty 2

## 2020-03-07 MED ORDER — SUGAMMADEX SODIUM 200 MG/2ML IV SOLN
INTRAVENOUS | Status: DC | PRN
  Administered 2020-03-07: 200 mg via INTRAVENOUS

## 2020-03-07 MED ORDER — PROMETHAZINE HCL 25 MG/ML IJ SOLN: 6.2500 mg | INTRAMUSCULAR | Status: DC | PRN

## 2020-03-07 MED ORDER — OXYCODONE HCL 5 MG PO TABS: 5.0000 mg | ORAL_TABLET | Freq: Once | ORAL | Status: DC | PRN

## 2020-03-07 MED ORDER — ONDANSETRON HCL 4 MG/2ML IJ SOLN: 4.0000 mg | Freq: Four times a day (QID) | INTRAMUSCULAR | Status: DC | PRN

## 2020-03-07 MED ORDER — SODIUM CHLORIDE 0.9 % IV SOLN
INTRAVENOUS | Status: DC | PRN
  Administered 2020-03-07: 500 mL

## 2020-03-07 SURGICAL SUPPLY — 36 items
ADH SKN CLS APL DERMABOND .7 (GAUZE/BANDAGES/DRESSINGS) ×2
ADH SKN CLS LQ APL DERMABOND (GAUZE/BANDAGES/DRESSINGS) ×2
CANISTER SUCT 3000ML PPV (MISCELLANEOUS) ×3 IMPLANT
CATH OMNI FLUSH .035X70CM (CATHETERS) ×1 IMPLANT
CLOSURE PERCLOSE PROSTYLE (VASCULAR PRODUCTS) ×3 IMPLANT
COVER DOME SNAP 22 D (MISCELLANEOUS) ×3 IMPLANT
DERMABOND ADHESIVE PROPEN (GAUZE/BANDAGES/DRESSINGS) ×1
DERMABOND ADVANCED (GAUZE/BANDAGES/DRESSINGS) ×1
DERMABOND ADVANCED .7 DNX12 (GAUZE/BANDAGES/DRESSINGS) ×2 IMPLANT
DERMABOND ADVANCED .7 DNX6 (GAUZE/BANDAGES/DRESSINGS) IMPLANT
DRYSEAL FLEXSHEATH 20FR 33CM (SHEATH) ×1
ELECT REM PT RETURN 9FT ADLT (ELECTROSURGICAL)
ELECTRODE REM PT RTRN 9FT ADLT (ELECTROSURGICAL) IMPLANT
GLOVE SS BIOGEL STRL SZ 7.5 (GLOVE) ×2 IMPLANT
GLOVE SUPERSENSE BIOGEL SZ 7.5 (GLOVE) ×1
GOWN STRL REUS W/ TWL LRG LVL3 (GOWN DISPOSABLE) ×4 IMPLANT
GOWN STRL REUS W/TWL LRG LVL3 (GOWN DISPOSABLE) ×6
GRAFT EXCLUDER AORTIC 26X3.3CM (Endovascular Graft) ×2 IMPLANT
KIT BASIN OR (CUSTOM PROCEDURE TRAY) ×3 IMPLANT
KIT TURNOVER KIT B (KITS) ×3 IMPLANT
NDL PERC 18GX7CM (NEEDLE) ×1 IMPLANT
NEEDLE PERC 18GX7CM (NEEDLE) ×3 IMPLANT
NS IRRIG 1000ML POUR BTL (IV SOLUTION) IMPLANT
PACK ENDOVASCULAR (PACKS) ×2 IMPLANT
PAD ARMBOARD 7.5X6 YLW CONV (MISCELLANEOUS) ×6 IMPLANT
SHEATH AVANTI 11CM 8FR (SHEATH) ×2 IMPLANT
SHEATH DRYSEAL FLEX 20FR 33CM (SHEATH) IMPLANT
STENT GRAFT BALLN  12FR 65 (STENTS) ×3
STENT GRAFT BALLN 12FR 65 (STENTS) ×1 IMPLANT
STOPCOCK MORSE 400PSI 3WAY (MISCELLANEOUS) ×3 IMPLANT
SUT VIC AB 3-0 SH 27 (SUTURE) ×3
SUT VIC AB 3-0 SH 27X BRD (SUTURE) IMPLANT
TOWEL GREEN STERILE (TOWEL DISPOSABLE) ×6 IMPLANT
TUBING HIGH PRESSURE 120CM (CONNECTOR) ×1 IMPLANT
WIRE AMPLATZ SS-J .035X180CM (WIRE) ×2 IMPLANT
WIRE BENTSON .035X145CM (WIRE) ×3 IMPLANT

## 2020-03-07 NOTE — Anesthesia Procedure Notes (Signed)
Arterial Line Insertion Start/End1/07/2020 10:20 AM, 03/07/2020 10:30 AM Performed by: Dorie Rank, CRNA, CRNA  Patient location: Pre-op. Preanesthetic checklist: patient identified, IV checked, site marked, risks and benefits discussed, surgical consent, monitors and equipment checked, pre-op evaluation, timeout performed and anesthesia consent Lidocaine 1% used for infiltration Right, radial was placed Catheter size: 20 G Hand hygiene performed , maximum sterile barriers used  and Seldinger technique used Allen's test indicative of satisfactory collateral circulation Attempts: 1 Procedure performed without using ultrasound guided technique. Following insertion, dressing applied and Biopatch. Post procedure assessment: normal and unchanged

## 2020-03-07 NOTE — Anesthesia Procedure Notes (Signed)
Procedure Name: Intubation Date/Time: 03/07/2020 11:26 AM Performed by: Reece Agar, CRNA Pre-anesthesia Checklist: Patient identified, Emergency Drugs available, Suction available and Patient being monitored Patient Re-evaluated:Patient Re-evaluated prior to induction Oxygen Delivery Method: Circle System Utilized Preoxygenation: Pre-oxygenation with 100% oxygen Induction Type: IV induction Ventilation: Mask ventilation without difficulty and Oral airway inserted - appropriate to patient size Laryngoscope Size: Mac and 4 Grade View: Grade II Tube type: Oral Tube size: 8.0 mm Number of attempts: 1 Airway Equipment and Method: Stylet and Oral airway Placement Confirmation: ETT inserted through vocal cords under direct vision,  positive ETCO2 and breath sounds checked- equal and bilateral Secured at: 23 cm Tube secured with: Tape Dental Injury: Teeth and Oropharynx as per pre-operative assessment

## 2020-03-07 NOTE — Anesthesia Postprocedure Evaluation (Signed)
Anesthesia Post Note  Patient: Charles Stephenson  Procedure(s) Performed: AORTOGRAM with insertion of  Aortic Cuff (N/A Abdomen) ULTRASOUND GUIDANCE FOR VASCULAR ACCESS, right femoral artery (Right Groin)     Patient location during evaluation: PACU Anesthesia Type: General Level of consciousness: awake and alert, patient cooperative and oriented Pain management: pain level controlled Vital Signs Assessment: post-procedure vital signs reviewed and stable Respiratory status: spontaneous breathing, nonlabored ventilation and respiratory function stable Cardiovascular status: blood pressure returned to baseline and stable Postop Assessment: no apparent nausea or vomiting Anesthetic complications: no   No complications documented.  Last Vitals:  Vitals:   03/07/20 1410 03/07/20 1440  BP: (!) 143/86 135/80  Pulse: 63 (!) 55  Resp: 14 12  Temp: (!) 36.2 C (!) 36.3 C  SpO2: 99% 98%    Last Pain:  Vitals:   03/07/20 1440  TempSrc:   PainSc: 0-No pain                 Najee Cowens,E. Yohanna Tow

## 2020-03-07 NOTE — Interval H&P Note (Signed)
History and Physical Interval Note:  03/07/2020 9:24 AM  Charles Stephenson  has presented today for surgery, with the diagnosis of Endoleak.  The various methods of treatment have been discussed with the patient and family. After consideration of risks, benefits and other options for treatment, the patient has consented to  Procedure(s): AORTOGRAM Possible Aortic Cuff for EVAR (N/A) as a surgical intervention.  The patient's history has been reviewed, patient examined, no change in status, stable for surgery.  I have reviewed the patient's chart and labs.  Questions were answered to the patient's satisfaction.     [name]

## 2020-03-07 NOTE — Transfer of Care (Signed)
Immediate Anesthesia Transfer of Care Note  Patient: Marlowe Lawes  Procedure(s) Performed: AORTOGRAM with insertion of  Aortic Cuff (N/A Abdomen) ULTRASOUND GUIDANCE FOR VASCULAR ACCESS, right femoral artery (Right Groin)  Patient Location: PACU  Anesthesia Type:General  Level of Consciousness: awake and alert   Airway & Oxygen Therapy: Patient Spontanous Breathing and Patient connected to face mask oxygen  Post-op Assessment: Report given to RN and Post -op Vital signs reviewed and stable  Post vital signs: Reviewed and stable  Last Vitals:  Vitals Value Taken Time  BP 162/82 03/07/20 1255  Temp    Pulse 63 03/07/20 1256  Resp 15 03/07/20 1256  SpO2 99 % 03/07/20 1256  Vitals shown include unvalidated device data.  Last Pain:  Vitals:   03/07/20 0908  TempSrc:   PainSc: 0-No pain      Patients Stated Pain Goal: 3 (03/07/20 0908)  Complications: No complications documented.

## 2020-03-07 NOTE — Op Note (Signed)
    OPERATIVE REPORT  DATE OF SURGERY: 03/07/2020  PATIENT: Charles Stephenson, 86 y.o. male MRN: [id number]  DOB: [birth year]  PRE-OPERATIVE DIAGNOSIS: Prior infrarenal aortic stent graft for aneurysm with expansion of aneurysm sac  POST-OPERATIVE DIAGNOSIS:  Same  PROCEDURE: Aortogram and placement of proximal aortic cuff  SURGEON:  [name], M.D.  PHYSICIAN ASSISTANT: Setzer PA C  The assistant was needed for exposure and to expedite the case  ANESTHESIA: General  EBL: per anesthesia record  Total I/O In: 1250 [I.V.:1150; IV Piggyback:100] Out: 30 [Blood:30]  BLOOD ADMINISTERED: none  DRAINS: none  SPECIMEN: none  COUNTS CORRECT:  YES  PATIENT DISPOSITION:  PACU - hemodynamically stable  PROCEDURE DETAILS: Patient status post prior aortic stent graft 3 years ago.  His initial postop scan in March 2019 revealed type II endoleak's from lumbars but no sac expansion with a maximal sac size of 5.0 cm.  He has been followed in our office with ultrasound.  Recent ultrasound showed some increase in sac size and CT was repeated showing a maximal diameter of 5.7 cm.  He did have persistent type II endoleak.  There is also an area behind his proximal attachment with contrast between the inner wall of the aorta and the stent graft.  This appeared to be a blind cul-de-sac with no evidence of connection to the aortic sac.  With the increased size I recommended arteriogram and probable cuff of the proximal attachment.  Also explained that this may be related to type II endoleak.  Patient was taken up and placed supine position where the area of the abdomen prepped draped in sterile fashion.  Using SonoSite for localization the left common femoral artery was entered with an 18-gauge needle and a guidewire was passed through this proximally.  2 Perclose devices were placed at 10 and 2 position on the artery and were partially deployed for later closure.  An 8 French sheath was passed  through this and a pigtail catheter was positioned above the level of the stent graft.  AP projection was undertaken.  This did show several lumbars causing type II endoleak.  There was good positioning of the stent graft proximally and distally.  There was visualization of the area of concern to the left posterior of the proximal attachment.  There was no evidence that this communicated with the aortic sac.  Due to the persistent contrast at this area.  I elected to place a aortic cuff for slightly more coverage and also for increased radial strength.  A 26 x 3.3 cm cuff was chosen.  The Amplatz superstiff wire was placed through the pigtail catheter and the 8 French sheath was removed and a 20 French sheath was placed.  The cath was positioned just below the level of the right renal which was the lowest renal.  Repeat arteriogram revealed the appropriate location.  The aortic cuff was deployed and was ballooned with a Q50 balloon.  A final completion showed good placement of the cuff.  There was still contrast at the prior area between the posterior wall and the stent graft.  The sheath was removed and the Perclose devices were secured.  There is still was some slight oozing therefore a third Perclose device was placed anteriorly controlling the puncture.  A 4-0 subcuticular Vicryl was placed and the patient was transferred to the recovery room in stable condition   Larina Earthly, M.D., [facility] 03/07/2020 3:17 PM

## 2020-03-08 ENCOUNTER — Encounter (HOSPITAL_COMMUNITY): Payer: Self-pay | Admitting: Vascular Surgery

## 2020-03-08 LAB — BASIC METABOLIC PANEL
Anion gap: 9 (ref 5–15)
BUN: 13 mg/dL (ref 8–23)
CO2: 25 mmol/L (ref 22–32)
Calcium: 9.1 mg/dL (ref 8.9–10.3)
Chloride: 103 mmol/L (ref 98–111)
Creatinine, Ser: 0.77 mg/dL (ref 0.61–1.24)
GFR, Estimated: >60 mL/min (ref >60)
Glucose, Bld: 109 mg/dL — ABNORMAL HIGH (ref 70–99)
Potassium: 4.3 mmol/L (ref 3.5–5.1)
Sodium: 137 mmol/L (ref 135–145)

## 2020-03-08 LAB — CBC
HCT: 37.5 % — ABNORMAL LOW (ref 39.0–52.0)
Hemoglobin: 13.3 g/dL (ref 13.0–17.0)
MCH: 32.1 pg (ref 26.0–34.0)
MCHC: 35.5 g/dL (ref 30.0–36.0)
MCV: 90.6 fL (ref 80.0–100.0)
Platelets: 172 10*3/uL (ref 150–400)
RBC: 4.14 MIL/uL — ABNORMAL LOW (ref 4.22–5.81)
RDW: 14.2 % (ref 11.5–15.5)
WBC: 10.8 10*3/uL — ABNORMAL HIGH (ref 4.0–10.5)
nRBC: 0 % (ref 0.0–0.2)

## 2020-03-08 NOTE — Progress Notes (Addendum)
  Progress Note    03/08/2020 7:28 AM 1 Day Post-Op  Subjective:  No complaints. Voiding spontaneously. Tolerating diet. Has been OOB and ambulating without difficulty   Vitals:   03/08/20 0022 03/08/20 0417  BP: 120/62   Pulse: 62   Resp: 16   Temp: 98 F (36.7 C) (P) 98.2 F (36.8 C)  SpO2: 99%     Physical Exam: Cardiac:  RRR Lungs:  nonlbaored Incisions:  Left groin puncture site is soft without hematoma or bleeding Extremities:  2-3+ bilateral DP pulses Abdomen:  Soft, ND, NT  CBC    Component Value Date/Time   WBC 10.8 (H) 03/08/2020 0412   RBC 4.14 (L) 03/08/2020 0412   HGB 13.3 03/08/2020 0412   HCT 37.5 (L) 03/08/2020 0412   PLT 172 03/08/2020 0412   MCV 90.6 03/08/2020 0412   MCH 32.1 03/08/2020 0412   MCHC 35.5 03/08/2020 0412   RDW 14.2 03/08/2020 0412   LYMPHSABS 1.0 03/18/2019 1414   MONOABS 0.7 03/18/2019 1414   EOSABS 0.1 03/18/2019 1414   BASOSABS 0.1 03/18/2019 1414    BMET    Component Value Date/Time   NA 137 03/08/2020 0412   NA 142 05/10/2015 0000   K 4.3 03/08/2020 0412   CL 103 03/08/2020 0412   CO2 25 03/08/2020 0412   GLUCOSE 109 (H) 03/08/2020 0412   BUN 13 03/08/2020 0412   BUN 16 05/10/2015 0000   CREATININE 0.77 03/08/2020 0412   CALCIUM 9.1 03/08/2020 0412   GFRNONAA >60 03/08/2020 0412   GFRAA >60 04/14/2017 0317     Intake/Output Summary (Last 24 hours) at 03/08/2020 0728 Last data filed at 03/08/2020 0448 Gross per 24 hour  Intake 1450 ml  Output 1330 ml  Net 120 ml    HOSPITAL MEDICATIONS Scheduled Meds: . aspirin EC  81 mg Oral Q0600  . atorvastatin  20 mg Oral Daily  . docusate sodium  100 mg Oral Daily  . pantoprazole  40 mg Oral Daily   Continuous Infusions: . sodium chloride    . sodium chloride    . magnesium sulfate bolus IVPB     PRN Meds:.sodium chloride, acetaminophen **OR** acetaminophen, alum & mag hydroxide-simeth, bisacodyl, guaiFENesin-dextromethorphan, hydrALAZINE, labetalol, magnesium  sulfate bolus IVPB, metoprolol tartrate, morphine injection, ondansetron, oxyCODONE-acetaminophen, phenol, polyethylene glycol, potassium chloride  Assessment: POD 1 Aortogram and placement of proximal aortic cuff. VSS. Afebrile. H and H stable. Good UOP.    Plan: -D/C home. Continue statin and aspirin -DVT prophylaxis:  SCDs   Wendi Maya, PA-C [facility] [phone] 03/08/2020  7:28 AM   I have seen and evaluated the patient. I agree with the PA note as documented above.  Postop day 1 status post proximal aortic cuff for stent graft with aneurysm expansion.  Left groin looks good this morning after percutaneous access and he has a palpable pedal pulse.  Overall he feels good and vitals have been stable overnight.  Plan for discharge this morning.  Will arrange follow-up with Dr. Arbie Cookey in [place] in 6 months with CTA.  Cephus Shelling, MD [facility] [place] Office: [phone]

## 2020-03-08 NOTE — Discharge Instructions (Signed)
   [facility] [place]   Discharge Instructions  Endovascular Aortic Aneurysm Repair  Please refer to the following instructions for your post-procedure care. Your surgeon or Physician Assistant will discuss any changes with you.  Activity  You are encouraged to walk as much as you can. You can slowly return to normal activities but must avoid strenuous activity and heavy lifting until your doctor tells you it's OK. Avoid activities such as vacuuming or swinging a gold club. It is normal to feel tired for several weeks after your surgery. Do not drive until your doctor gives the OK and you are no longer taking prescription pain medications. It is also normal to have difficulty with sleep habits, eating, and bowel movements after surgery. These will go away with time.  Bathing/Showering  You may shower after you go home. If you have an incision, do not soak in a bathtub, hot tub, or swim until the incision heals completely.  Incision Care  Shower every day. Clean your incision with mild soap and water. Pat the area dry with a clean towel. You do not need a bandage unless otherwise instructed. Do not apply any ointments or creams to your incision. If you clothing is irritating, you may cover your incision with a dry gauze pad.  Diet  Resume your normal diet. There are no special food restrictions following this procedure. A low fat/low cholesterol diet is recommended for all patients with vascular disease. In order to heal from your surgery, it is CRITICAL to get adequate nutrition. Your body requires vitamins, minerals, and protein. Vegetables are the best source of vitamins and minerals. Vegetables also provide the perfect balance of protein. Processed food has little nutritional value, so try to avoid this.  Medications  Resume taking all of your medications unless your doctor or nurse practitioner tells you not to. If your incision is causing pain, you may take  over-the-counter pain relievers such as acetaminophen (Tylenol). If you were prescribed a stronger pain medication, please be aware these medications can cause nausea and constipation. Prevent nausea by taking the medication with a snack or meal. Avoid constipation by drinking plenty of fluids and eating foods with a high amount of fiber, such as fruits, vegetables, and grains. Do not take Tylenol if you are taking prescription pain medications.   Follow up  Our office will schedule a follow-up appointment with a C.T. scan 3-4 weeks after your surgery.  Please call us immediately for any of the following conditions  Severe or worsening pain in your legs or feet or in your abdomen back or chest. Increased pain, redness, drainage (pus) from your incision sit. Increased abdominal pain, bloating, nausea, vomiting or persistent diarrhea. Fever of 101 degrees or higher. Swelling in your leg (s),  Reduce your risk of vascular disease  Stop smoking. If you would like help call [facility] at 1-800-QUIT-NOW (1-800-784-8669) or [place] at 336-586-4000. Manage your cholesterol Maintain a desired weight Control your diabetes Keep your blood pressure down  If you have questions, please call the office at [phone].   

## 2020-03-08 NOTE — Progress Notes (Signed)
Discharge instructions (including medications) discussed with and copy provided to patient/caregiver 

## 2020-03-08 NOTE — Plan of Care (Signed)
  Problem: Health Behavior/Discharge Planning: Goal: Ability to manage health-related needs will improve Outcome: Progressing   Problem: Clinical Measurements: Goal: Will remain free from infection Outcome: Progressing Goal: Diagnostic test results will improve Outcome: Progressing   

## 2020-03-08 NOTE — Discharge Summary (Signed)
AAA Discharge Summary    Charles Stephenson Jun 07, 1934 86 y.o. male  Charles Stephenson  Admission Date: 03/07/2020  Discharge Date: 03/08/2020 Physician: Larina Earthly, MD  Admission Diagnosis: Type II endoleak of aortic graft [I97.89] Increase in AAA sac size  HPI:   This is a 86 y.o. male status post prior aortic stent graft 3 years ago.  His initial postop scan in March 2019 revealed type II endoleak's from lumbars but no sac expansion with a maximal sac size of 5.0 cm.  He has been followed in our office with ultrasound.  Recent ultrasound showed some increase in sac size and CT was repeated showing a maximal diameter of 5.7 cm.    Hospital Course:  The patient was admitted to the hospital and taken to the operating room on 03/07/2020 and underwent: Aortogram and placement of proximal aortic cuff    Findings: Prior infrarenal aortic stent graft for aneurysm with expansion of aneurysm sac. He did have persistent type II endoleak.  There is also an area behind his proximal attachment with contrast between the inner wall of the aorta and the stent graft.  This appeared to be a blind cul-de-sac with no evidence of connection to the aortic sac.  The pt tolerated the procedure well and was transported to the PACU in excellent condition.   By POD 1, he had no complaints and was voiding and ambulating without difficulty. His vital signs were stable as was his H and H. Normal serum creatinine. No groin hematoma. No back or abdominal pain. Tolerating diet without N, V or pain.  The remainder of the hospital course consisted of increasing mobilization and increasing intake of solids without difficulty.  CBC    Component Value Date/Time   WBC 10.8 (H) 03/08/2020 0412   RBC 4.14 (L) 03/08/2020 0412   HGB 13.3 03/08/2020 0412   HCT 37.5 (L) 03/08/2020 0412   PLT 172 03/08/2020 0412   MCV 90.6 03/08/2020 0412   MCH 32.1 03/08/2020 0412   MCHC 35.5 03/08/2020 0412   RDW 14.2 03/08/2020 0412    LYMPHSABS 1.0 03/18/2019 1414   MONOABS 0.7 03/18/2019 1414   EOSABS 0.1 03/18/2019 1414   BASOSABS 0.1 03/18/2019 1414    BMET    Component Value Date/Time   NA 137 03/08/2020 0412   NA 142 05/10/2015 0000   K 4.3 03/08/2020 0412   CL 103 03/08/2020 0412   CO2 25 03/08/2020 0412   GLUCOSE 109 (H) 03/08/2020 0412   BUN 13 03/08/2020 0412   BUN 16 05/10/2015 0000   CREATININE 0.77 03/08/2020 0412   CALCIUM 9.1 03/08/2020 0412   GFRNONAA >60 03/08/2020 0412   GFRAA >60 04/14/2017 0317     Discharge Instructions    Discharge patient   Complete by: As directed    Discharge disposition: 01-Home or Self Care   Discharge patient date: 03/08/2020      Discharge Diagnosis:  Type II endoleak of aortic graft [I97.89]  Secondary Diagnosis: Patient Active Problem List   Diagnosis Date Noted  . Type II endoleak of aortic graft 03/07/2020  . Macular degeneration 03/18/2019  . S/P AAA repair 02/22/2018  . Lumbosacral radiculitis 09/04/2017  . Low back pain 04/21/2017  . Vertigo 05/29/2016  . Colon cancer screening 04/21/2016  . History of basal cell carcinoma of skin 12/10/2015  . Seasonal allergies   . Chest pain 03/12/2012  . Hyperlipidemia 03/12/2012  . AAA (abdominal aortic aneurysm) (HCC) 03/12/2012   Past Medical History:  Diagnosis Date  . AAA (abdominal aortic aneurysm) ([place])   . Basal cell carcinoma    02/09/2020: patient denies  . Diverticulitis    3x through 18 months around 2012  . Hepatitis    02/29/2020: patient denies hx  . History of alcohol abuse    sober since age 83  . History of vertigo    december 2007   . Hypercholesterolemia   . Inguinal hernia 2005   left side per patient  . Seasonal allergies    no rx     Allergies as of 03/08/2020   No Known Allergies     Medication List    TAKE these medications   atorvastatin 20 MG tablet Commonly known as: LIPITOR TAKE 1 TABLET BY MOUTH EVERY EVENING AT 6PM What changed:   how much to  take  how to take this  when to take this  additional instructions   CENTRUM SILVER PO Take 1 tablet by mouth daily. Notes to patient: Take as you were at home.   ICAPS AREDS FORMULA PO Take 1 tablet by mouth 2 (two) times daily. Notes to patient: Take as you were at home.   loratadine 10 MG tablet Commonly known as: CLARITIN Take 10 mg by mouth daily as needed for allergies.   METAMUCIL FIBER PO Take 5 capsules by mouth at bedtime. Notes to patient: Take as you were at home.   Sulfacetamide Sodium-Sulfur 9-4.5 % Liqd Apply 1 application topically every other day. Notes to patient: Take as you were at home.   VITAMIN D PO Take 2 capsules by mouth daily. Notes to patient: Take as you were at home.       Instructions:  [facility] Discharge Instructions   Open Aortic Surgery  Please refer to the following instructions for your post-procedure care. Your surgeon or Physician Assistant will discuss any changes with you.  Activity  Avoid lifting more than eight pounds (a gallon of milk) until after your first post-operative visit. You are encouraged to walk as much as you can. You can slowly return to normal activities but must avoid strenuous activity and heavy lifting until your doctor tells you it's okay. Heavy lifting can hurt the incision and cause a hernia. Avoid activities such as vacuuming or swinging a golf club. It is normal to feel tired for several weeks after your surgery. Do not drive until your doctor gives the okay and you are no longer taking prescription pain medications. It is also normal to have difficulty with sleep habits, eating and bowl movements after surgery. These will go away with time.  Bathing/Showering  Shower daily after you go home. Do not soak in a bathtub, hot tub, or swim until the incision heals.  Incision Care  Shower every day. Clean your incision with mild soap and water. Pat the area dry with a  clean towel. You do not need a bandage unless otherwise instructed. Do not apply any ointments or creams to your incision. You may have skin glue on your incision. Do not peel it off. It will come off on its own in about one week. If you have staples or sutures along your incision, they will be removed at your post op appointment.  If you have groin incisions, wash the groin wounds with soap and water daily and pat dry. (No tub bath-only shower)  Then put a dry gauze or washcloth in the groin to keep this area dry to help prevent  wound infection.  Do this daily and as needed.  Do not use Vaseline or neosporin on your incisions.  Only use soap and water on your incisions and then protect and keep dry.  Diet  Resume your normal diet. There are no special food restriction following this procedure. A low fat/low cholesterol diet is recommended for all patients with vascular disease. After your aortic surgery, it's normal to feel full faster than usual and to not feel as hungry as you normally would. You will probably lose weight initially following your surgery. It's best to eat small, frequent meals over the course of the day. Call the office if you find that you are unable to eat even small meals.   In order to heal from your surgery, it is CRITICAL to get adequate nutrition. Your body requires vitamins, minerals, and protein. Vegetables are the best source of vitamins and minerals.  If you have pain, you may take over-the-counter pain reliever such as acetaminophen (Tylenol). If you were prescribed a stronger pain medication, please be aware these medication can cause nausea and constipation. Prevent nausea by taking the medication with a snack or meal. Avoid constipation by drinking plenty of fluids and eating foods with a high amount of fiber, such as fruits, vegetables and grains. Take 100mg  of the over-the-counter stool softener Colace twice a day as needed to help with constipation. A laxative, such as  Milk of Magnesia, may be recommended for you at this time. Do not take a laxative unless your surgeon or P.A. tells you it's OK.  Do not take Tylenol if you are taking stronger pain medications (such as Percocet).  Follow Up  Our office will schedule a follow up appointment 2-3 weeks after discharge.  Please call us immediately for any of the following conditions    .     Severe or worsening pain in your legs or feet or in your abdomen back or chest. . Increased pain, redness drainage (pus) from your incision site. . Increased abdominal pain, bloating, nausea, vomiting, or persistent diarrhea. . Fever of 101 degrees or higher. . Swelling in your leg (s). .  Reduce your risk of vascular disease  . Stop smoking. If you would like help, call [facility] at 1-800-QUIT-NOW 251-194-3880) or [place] at 7015478417. . Manage your cholesterol . Maintain a desired weight . Control your diabetes . Keep your blood pressure down .  If you have any questions please call the office at [phone].   Prescriptions given: Disposition: Home  Patient's condition: is Excellent  Follow up: 1. Dr. Donnetta Hutching in 6 months with CTA abdomen and pelvis.   Risa Grill, PA-C [facility] [phone] 03/08/2020  11:01 AM   - For VQI Registry use -  Post-op:  Time to Extubation: [x]  In OR, [ ]  < 12 hrs, [ ]  12-24 hrs, [ ]  >=24 hrs Vasopressors Req. Post-op: No ICU Stay: 0 day in  Transfusion: No   If yes, 0 units given MI: No, [ ]  Troponin only, [ ]  EKG or Clinical New Arrhythmia: No  Complications: CHF: No Resp failure: No, [ ]  Pneumonia, [ ]  Ventilator Chg in renal function: No, [ ]  Inc. Cr > 0.5, [ ]  Temp. Dialysis, [ ]  Permanent dialysis Leg ischemia: No, no Surgery needed, [ ]  Yes, Surgery needed, [ ]  Amputation Bowel ischemia: No, [ ]  Medical Rx, [ ]  Surgical Rx Wound complication: No, [ ]  Superficial separation/infection, [ ]  Return to OR Return to OR:  No  Return to OR for bleeding: No Stroke: No, [ ]  Minor, [ ]  Major  Discharge medications: Statin use:  Yes  ASA use:  No   Plavix use:  No  Beta blocker use:  No  ACEI use:  No ARB use:  No CCB use:  No Coumadin use:  No

## 2020-03-08 NOTE — Plan of Care (Signed)
  Problem: Education: Goal: Knowledge of General Education information will improve Description: Including pain rating scale, medication(s)/side effects and non-pharmacologic comfort measures 03/08/2020 1118 by Jarome Lamas, RN Outcome: Adequate for Discharge 03/08/2020 1118 by Jarome Lamas, RN Outcome: Adequate for Discharge

## 2020-03-09 ENCOUNTER — Telehealth: Payer: Self-pay

## 2020-03-09 NOTE — Telephone Encounter (Signed)
Noted declined TCM/hospital follow-up-we'll see him on February 1-happy to see him sooner if needed

## 2020-03-09 NOTE — Telephone Encounter (Cosign Needed)
Transition Care Management Follow-up Telephone Call  Date of discharge and from where: [place] hospital on 03/08/20  How have you been since you were released from the hospital? Doing well   Any questions or concerns? No   Items Reviewed:  Did the pt receive and understand the discharge instructions provided? Yes   Medications obtained and verified? Yes   Other? No   Any new allergies since your discharge? No   Dietary orders reviewed? Yes  Do you have support at home? Yes   Home Care and Equipment/Supplies: Were home health services ordered? not applicable If so, what is the name of the agency?   Has the agency set up a time to come to the patient's home? not applicable Were any new equipment or medical supplies ordered?  No What is the name of the medical supply agency?  Were you able to get the supplies/equipment? not applicable Do you have any questions related to the use of the equipment or supplies? No  Functional Questionnaire: (I = Independent and D = Dependent) ADLs: I  Bathing/Dressing- I  Meal Prep- I  Eating- I  Maintaining continence- I  Transferring/Ambulation- I  Managing Meds- I  Follow up appointments reviewed:   PCP Hospital f/u appt confirmed? No   Pt doesn't want appt for HFU he request to leave appt as is and he will be in on 04/03/20   Are transportation arrangements needed? No   If their condition worsens, is the pt aware to call PCP or go to the Emergency Dept.? Yes  Was the patient provided with contact information for the PCP's office or ED? Yes  Was to pt encouraged to call back with questions or concerns? Yes

## 2020-03-19 ENCOUNTER — Encounter: Payer: PPO | Admitting: Family Medicine

## 2020-03-28 ENCOUNTER — Encounter: Payer: Self-pay | Admitting: Family Medicine

## 2020-04-02 NOTE — Progress Notes (Signed)
Phone: [phone]   Subjective:  Patient presents today for their annual physical. Chief complaint-noted.   See problem oriented charting- ROS- full  review of systems was completed and negative  except for: had some neck pain on right that resolved- muscular after slept awkwardly  The following were reviewed and entered/updated in epic: Past Medical History:  Diagnosis Date  . AAA (abdominal aortic aneurysm) ([place])   . Basal cell carcinoma    02/09/2020: patient denies  . Diverticulitis    3x through 18 months around 2012  . Hepatitis    02/29/2020: patient denies hx  . History of alcohol abuse    sober since age 77  . History of vertigo    december 2007   . Hypercholesterolemia   . Inguinal hernia 2005   left side per patient  . Seasonal allergies    no rx   Patient Active Problem List   Diagnosis Date Noted  . History of basal cell carcinoma of skin 12/10/2015    Priority: Medium  . Hyperlipidemia 03/12/2012    Priority: Medium  . AAA (abdominal aortic aneurysm) ([place]) 03/12/2012    Priority: Medium  . Macular degeneration 03/18/2019    Priority: Low  . Vertigo 05/29/2016    Priority: Low  . Colon cancer screening 04/21/2016    Priority: Low  . Seasonal allergies     Priority: Low  . Chest pain 03/12/2012    Priority: Low  . Type II endoleak of aortic graft 03/07/2020  . S/P AAA repair 02/22/2018  . Lumbosacral radiculitis 09/04/2017  . Low back pain 04/21/2017   Past Surgical History:  Procedure Laterality Date  . ABDOMINAL AORTIC ENDOVASCULAR STENT GRAFT  04/13/2017   w/EMBOLIZATION LEFT HYPOGASTRIC USING INTERLOCK-35 DETACHABLE COIL/notes 04/13/2017  . ABDOMINAL AORTIC ENDOVASCULAR STENT GRAFT N/A 04/13/2017   Procedure: ABDOMINAL AORTIC ENDOVASCULAR STENT GRAFT;  Surgeon: Rosetta Posner, MD;  [facility];  Service: Vascular;  Laterality: N/A;  . AORTOGRAM N/A 03/07/2020   Procedure: AORTOGRAM with insertion of  Aortic Cuff;  Surgeon: Rosetta Posner,  MD;  Location: [place];  Service: Vascular;  Laterality: N/A;  . Aortogram and placement of proximal aortic cuff  03/07/2020  . BACK SURGERY    . BASAL CELL CARCINOMA EXCISION  X~ 9   "chest, back, left leg" (04/13/2017)  . CARDIOVASCULAR STRESS TEST  Jan. 14,2014  . CATARACT EXTRACTION W/ INTRAOCULAR LENS  IMPLANT, BILATERAL Bilateral    L pupil larger after lens implant  . COLONOSCOPY    . EMBOLIZATION N/A 04/13/2017   Procedure: EMBOLIZATION LEFT HYPOGASTRIC USING INTERLOCK-35 DETACHABLE COIL;  Surgeon: Rosetta Posner, MD;  Location: [place];  Service: Vascular;  Laterality: N/A;  . INGUINAL HERNIA REPAIR Right 2005  . MOHS SURGERY  09/2014; ~ 2014   BCC; nose and left ear  . POSTERIOR FUSION LUMBAR SPINE  10/15/1982   "the 2 lowest discs fused"  . ULTRASOUND GUIDANCE FOR VASCULAR ACCESS Right 03/07/2020   Procedure: ULTRASOUND GUIDANCE FOR VASCULAR ACCESS, right femoral artery;  Surgeon: Rosetta Posner, MD;  Location: [facility] OR;  Service: Vascular;  Laterality: Right;    Family History  Problem Relation Age of Onset  . AAA (abdominal aortic aneurysm) Father        sounds like died of DIC- otherwise healthy  . Peripheral vascular disease Father        Aneurysm- Aorta  . AAA (abdominal aortic aneurysm) Brother        states multiple  medicle issues but died of pulmonary fibrosis- was told smoking related  . Heart disease Brother 39       Before age 69  . Heart attack Brother        x2  . Alcohol abuse Brother        sober 25 years when he died  . Prostate cancer Brother   . Cancer Mother        Stomach    Medications- reviewed and updated Current Outpatient Medications  Medication Sig Dispense Refill  . loratadine (CLARITIN) 10 MG tablet Take 10 mg by mouth daily as needed for allergies.    Marland Kitchen METAMUCIL FIBER PO Take 5 capsules by mouth at bedtime.    . Multiple Vitamins-Minerals (CENTRUM SILVER PO) Take 1 tablet by mouth daily.     . Multiple Vitamins-Minerals (ICAPS AREDS FORMULA PO)  Take 1 tablet by mouth 2 (two) times daily.    . Sulfacetamide Sodium-Sulfur 9-4.5 % LIQD Apply 1 application topically every other day.    Marland Kitchen VITAMIN D PO Take 2 capsules by mouth daily.    Marland Kitchen atorvastatin (LIPITOR) 20 MG tablet Take 1 tablet (20 mg total) by mouth daily. 90 tablet 3   No current facility-administered medications for this visit.    Allergies-reviewed and updated No Known Allergies  Social History   Social History Narrative   Married to wife Olie Lizak (patient of mine). 2 kids- 1 local. No grandkids and wont have them. No pets.       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 Oropharynx normal Neck: no thyromegaly CV: RRR no murmurs rubs or gallops Lungs: CTAB no crackles, wheeze, rhonchi Abdomen: soft/nontender/nondistended/normal bowel sounds. No rebound or guarding.  Ext: no edema Skin: warm, dry Neuro: grossly normal, moves all extremities, PERRLA    Assessment and Plan  86 y.o. male presenting for annual physical.  Health Maintenance counseling: 1. Anticipatory guidance: Patient counseled regarding regular dental exams -q6 months- he goes every 4-5 months, eye exams - at least yearly with Dr. Amalia Hailey- macular degeneration noted but he has not noted any changes other than bright lights bother him,  avoiding smoking and second hand smoke , limiting alcohol to 2 beverages per day - never drinks- sober over 40 years 2. Risk factor reduction:  Advised patient of need for regular exercise and diet rich and fruits and vegetables to reduce risk of  heart attack and stroke. Exercise- prior to surgery walking 4 miles a day (3-3.5 in winter) and strength training every other day for 30 mins- was not on prolonged resrictions with surgery. Diet-tries to eat balanced diet- has had slight weight loss over last year.  Wt Readings from Last 3 Encounters:  04/03/20 157 lb 6.4 oz (71.4 kg)  03/07/20 158 lb 11.7 oz (72 kg)  02/29/20 158 lb 12.8 oz (72 kg)  3. Immunizations/screenings/ancillary studies- fully up to date  Immunization History  Administered Date(s)  Administered  . BCG 12/14/2017  . DT (Pediatric) 08/20/2017  . Fluad Quad(high Dose 65+) 12/13/2018  . Influenza, High Dose Seasonal PF 12/10/2015, 12/05/2016, 12/14/2017  . Influenza,inj,Quad PF,6+ Mos 12/08/2019  . Influenza-Unspecified 12/01/2012, 11/28/2016  . Moderna Sars-Covid-2 Vaccination 03/15/2019, 04/12/2019, 01/04/2020  . PPD Test 12/14/2017  . Pneumococcal Conjugate-13 05/29/2016  . Pneumococcal Polysaccharide-23 08/07/2017  . Rabies, IM 08/20/2017, 08/23/2017, 09/03/2017  . Tdap 08/20/2017  . Zoster 07/14/2019, 12/21/2019  4. Prostate cancer screening- no further PSA screening based on age based screening recs. No significant change in urinary symptoms  Lab Results  Component Value Date   PSA 1.0 05/10/2015   5. Colon cancer screening -  no recall due to age after 03/25/16 colonoscopy with Dr. Oletta Lamas of [facility]. Apparently had asked patient to do stool cards in 2023 though- consider next year 6. Skin cancer screening- sees [place] derm with hx skin cancer- plans to go in march. advised regular sunscreen use. Denies worrisome, changing, or new skin lesions.  7. Never smoker 8. STD screening - only active with wife  Status of chronic or acute concerns   # AAA- followed by vascular surgery. Had type II endoleak and required  repair 03/07/20 with Dr. Donnetta Hutching due to sac size increase and AA up to 5.7 cm. Repeat in 6 months planned.   #hyperlipidemia S: Medication: Atorvastatin  20Mg  Lab Results  Component Value Date   CHOL 138 03/18/2019   HDL 55.90 03/18/2019   LDLCALC 65 03/18/2019   TRIG 86.0 03/18/2019   CHOLHDL 2 03/18/2019   A/P:  update lipid panel with lab- very well controlled in past. Likely continue current meds  #rare reflux- sparing OTC meds  Recommended follow up: Return in about 1 year (around 04/03/2021) for physical or sooner if needed.  Lab/Order associations:NOT  Fasting- last ate 9 30 Am and labs around 2 PM   ICD-10-CM   1. Preventative health care  Z00.00 Lipid panel    Comprehensive metabolic panel    CBC with Differential/Platelet    atorvastatin (LIPITOR) 20 MG tablet  2. Hyperlipidemia, unspecified hyperlipidemia type  E78.5 Lipid panel    Comprehensive metabolic panel    CBC with Differential/Platelet    atorvastatin (LIPITOR) 20 MG tablet   Meds ordered this encounter  Medications  . atorvastatin (LIPITOR) 20 MG tablet    Sig: Take 1 tablet (20 mg total) by mouth daily.    Dispense:  90 tablet    Refill:  3    Return precautions advised.  Garret Reddish, MD

## 2020-04-02 NOTE — Patient Instructions (Addendum)
Please stop by lab before you go If you have mychart- we will send your results within 3 business days of [place] receiving them.  If you do not have mychart- we will call you about results within 5 business days of [place] receiving them.  *please also note that you will see labs on mychart as soon as they post. I will later go in and write notes on them- will say "notes from Dr. Yong Channel"  Glad you are doing so well   Recommended follow up: Return in about 1 year (around 04/03/2021) for physical or sooner if needed.

## 2020-04-03 ENCOUNTER — Ambulatory Visit (INDEPENDENT_AMBULATORY_CARE_PROVIDER_SITE_OTHER): Payer: PPO | Admitting: Family Medicine

## 2020-04-03 ENCOUNTER — Encounter: Payer: Self-pay | Admitting: Family Medicine

## 2020-04-03 ENCOUNTER — Other Ambulatory Visit: Payer: Self-pay

## 2020-04-03 VITALS — BP 136/82 | HR 53 | Temp 97.9°F | Ht 70.0 in | Wt 157.4 lb

## 2020-04-03 DIAGNOSIS — I714 Abdominal aortic aneurysm, without rupture, unspecified: Secondary | ICD-10-CM

## 2020-04-03 DIAGNOSIS — E785 Hyperlipidemia, unspecified: Secondary | ICD-10-CM | POA: Diagnosis not present

## 2020-04-03 DIAGNOSIS — Z0000 Encounter for general adult medical examination without abnormal findings: Secondary | ICD-10-CM | POA: Diagnosis not present

## 2020-04-03 LAB — LIPID PANEL
Cholesterol: 153 mg/dL (ref 0–200)
HDL: 65.3 mg/dL (ref >39.00)
LDL Cholesterol: 72 mg/dL (ref 0–99)
NonHDL: 87.82
Total CHOL/HDL Ratio: 2
Triglycerides: 77 mg/dL (ref 0.0–149.0)
VLDL: 15.4 mg/dL (ref 0.0–40.0)

## 2020-04-03 LAB — CBC WITH DIFFERENTIAL/PLATELET
Basophils Absolute: 0 10*3/uL (ref 0.0–0.1)
Basophils Relative: 0.6 % (ref 0.0–3.0)
Eosinophils Absolute: 0.1 10*3/uL (ref 0.0–0.7)
Eosinophils Relative: 2.3 % (ref 0.0–5.0)
HCT: 41.5 % (ref 39.0–52.0)
Hemoglobin: 14 g/dL (ref 13.0–17.0)
Lymphocytes Relative: 19.7 % (ref 12.0–46.0)
Lymphs Abs: 1.3 10*3/uL (ref 0.7–4.0)
MCHC: 33.8 g/dL (ref 30.0–36.0)
MCV: 92.1 fl (ref 78.0–100.0)
Monocytes Absolute: 0.7 10*3/uL (ref 0.1–1.0)
Monocytes Relative: 11.3 % (ref 3.0–12.0)
Neutro Abs: 4.3 10*3/uL (ref 1.4–7.7)
Neutrophils Relative %: 66.1 % (ref 43.0–77.0)
Platelets: 175 10*3/uL (ref 150.0–400.0)
RBC: 4.51 Mil/uL (ref 4.22–5.81)
RDW: 13.8 % (ref 11.5–15.5)
WBC: 6.5 10*3/uL (ref 4.0–10.5)

## 2020-04-03 LAB — COMPREHENSIVE METABOLIC PANEL
ALT: 24 U/L (ref 0–53)
AST: 26 U/L (ref 0–37)
Albumin: 4.5 g/dL (ref 3.5–5.2)
Alkaline Phosphatase: 64 U/L (ref 39–117)
BUN: 15 mg/dL (ref 6–23)
CO2: 30 mEq/L (ref 19–32)
Calcium: 9.7 mg/dL (ref 8.4–10.5)
Chloride: 100 mEq/L (ref 96–112)
Creatinine, Ser: 0.83 mg/dL (ref 0.40–1.50)
GFR: 80.9 mL/min (ref >60.00)
Glucose, Bld: 89 mg/dL (ref 70–99)
Potassium: 4.3 mEq/L (ref 3.5–5.1)
Sodium: 136 mEq/L (ref 135–145)
Total Bilirubin: 0.7 mg/dL (ref 0.2–1.2)
Total Protein: 7.4 g/dL (ref 6.0–8.3)

## 2020-04-03 MED ORDER — ATORVASTATIN CALCIUM 20 MG PO TABS: 20.0000 mg | ORAL_TABLET | Freq: Every day | ORAL | 3 refills | Status: DC

## 2020-05-10 DIAGNOSIS — H43813 Vitreous degeneration, bilateral: Secondary | ICD-10-CM | POA: Diagnosis not present

## 2020-05-10 DIAGNOSIS — H353131 Nonexudative age-related macular degeneration, bilateral, early dry stage: Secondary | ICD-10-CM | POA: Diagnosis not present

## 2020-05-10 DIAGNOSIS — H5202 Hypermetropia, left eye: Secondary | ICD-10-CM | POA: Diagnosis not present

## 2020-05-10 DIAGNOSIS — H52203 Unspecified astigmatism, bilateral: Secondary | ICD-10-CM | POA: Diagnosis not present

## 2020-05-10 DIAGNOSIS — H524 Presbyopia: Secondary | ICD-10-CM | POA: Diagnosis not present

## 2020-05-10 DIAGNOSIS — Z961 Presence of intraocular lens: Secondary | ICD-10-CM | POA: Diagnosis not present

## 2020-05-10 DIAGNOSIS — H40003 Preglaucoma, unspecified, bilateral: Secondary | ICD-10-CM | POA: Diagnosis not present

## 2020-05-10 DIAGNOSIS — H35362 Drusen (degenerative) of macula, left eye: Secondary | ICD-10-CM | POA: Diagnosis not present

## 2020-06-06 DIAGNOSIS — D225 Melanocytic nevi of trunk: Secondary | ICD-10-CM | POA: Diagnosis not present

## 2020-06-06 DIAGNOSIS — D485 Neoplasm of uncertain behavior of skin: Secondary | ICD-10-CM | POA: Diagnosis not present

## 2020-06-06 DIAGNOSIS — L570 Actinic keratosis: Secondary | ICD-10-CM | POA: Diagnosis not present

## 2020-06-06 DIAGNOSIS — L821 Other seborrheic keratosis: Secondary | ICD-10-CM | POA: Diagnosis not present

## 2020-06-06 DIAGNOSIS — C44619 Basal cell carcinoma of skin of left upper limb, including shoulder: Secondary | ICD-10-CM | POA: Diagnosis not present

## 2020-06-06 DIAGNOSIS — D1801 Hemangioma of skin and subcutaneous tissue: Secondary | ICD-10-CM | POA: Diagnosis not present

## 2020-06-06 DIAGNOSIS — L853 Xerosis cutis: Secondary | ICD-10-CM | POA: Diagnosis not present

## 2020-06-06 DIAGNOSIS — C4442 Squamous cell carcinoma of skin of scalp and neck: Secondary | ICD-10-CM | POA: Diagnosis not present

## 2020-06-06 DIAGNOSIS — Z85828 Personal history of other malignant neoplasm of skin: Secondary | ICD-10-CM | POA: Diagnosis not present

## 2020-06-18 NOTE — Progress Notes (Incomplete)
Subjective:   Charles Stephenson is a 86 y.o. male who presents for Medicare Annual/Subsequent preventive examination.  Review of Systems           Objective:    There were no vitals filed for this visit. There is no height or weight on file to calculate BMI.  Advanced Directives 03/07/2020 03/07/2020 02/29/2020 03/18/2019 04/13/2017 04/08/2017 06/05/2016  Does Patient Have a Medical Advance Directive? Yes - Yes Yes Yes Yes Yes  Type of Paramedic of [place];Living will - [place];Living will Living will;Healthcare Power of Attorney Living will [place];Living will [place];Living will  Does patient want to make changes to medical advance directive? No - Patient declined No - Patient declined No - Patient declined No - Patient declined No - Patient declined No - Patient declined -  Copy of [place] in Chart? Yes - validated most recent copy scanned in chart (See row information) - Yes - validated most recent copy scanned in chart (See row information) No - copy requested - No - copy requested -    Current Medications (verified) Outpatient Encounter Medications as of 06/18/2020  Medication Sig  . atorvastatin (LIPITOR) 20 MG tablet Take 1 tablet (20 mg total) by mouth daily.  Marland Kitchen loratadine (CLARITIN) 10 MG tablet Take 10 mg by mouth daily as needed for allergies.  Marland Kitchen METAMUCIL FIBER PO Take 5 capsules by mouth at bedtime.  . Multiple Vitamins-Minerals (CENTRUM SILVER PO) Take 1 tablet by mouth daily.   . Multiple Vitamins-Minerals (ICAPS AREDS FORMULA PO) Take 1 tablet by mouth 2 (two) times daily.  . Sulfacetamide Sodium-Sulfur 9-4.5 % LIQD Apply 1 application topically every other day.  Marland Kitchen VITAMIN D PO Take 2 capsules by mouth daily.   No facility-administered encounter medications on file as of 06/18/2020.    Allergies (verified) Patient has no known allergies.   History: Past  Medical History:  Diagnosis Date  . AAA (abdominal aortic aneurysm) ([place])   . Basal cell carcinoma    02/09/2020: patient denies  . Diverticulitis    3x through 18 months around 2012  . Hepatitis    02/29/2020: patient denies hx  . History of alcohol abuse    sober since age 4  . History of vertigo    december 2007   . Hypercholesterolemia   . Inguinal hernia 2005   left side per patient  . Seasonal allergies    no rx   Past Surgical History:  Procedure Laterality Date  . ABDOMINAL AORTIC ENDOVASCULAR STENT GRAFT  04/13/2017   w/EMBOLIZATION LEFT HYPOGASTRIC USING INTERLOCK-35 DETACHABLE COIL/notes 04/13/2017  . ABDOMINAL AORTIC ENDOVASCULAR STENT GRAFT N/A 04/13/2017   Procedure: ABDOMINAL AORTIC ENDOVASCULAR STENT GRAFT;  Surgeon: Rosetta Posner, MD;  Location: [facility] OR;  Service: Vascular;  Laterality: N/A;  . AORTOGRAM N/A 03/07/2020   Procedure: AORTOGRAM with insertion of  Aortic Cuff;  Surgeon: Rosetta Posner, MD;  Location: [place];  Service: Vascular;  Laterality: N/A;  . Aortogram and placement of proximal aortic cuff  03/07/2020  . BACK SURGERY    . BASAL CELL CARCINOMA EXCISION  X~ 9   "chest, back, left leg" (04/13/2017)  . CARDIOVASCULAR STRESS TEST  Jan. 14,2014  . CATARACT EXTRACTION W/ INTRAOCULAR LENS  IMPLANT, BILATERAL Bilateral    L pupil larger after lens implant  . COLONOSCOPY    . EMBOLIZATION N/A 04/13/2017   Procedure: EMBOLIZATION LEFT HYPOGASTRIC USING  INTERLOCK-35 DETACHABLE COIL;  Surgeon: Rosetta Posner, MD;  Location: [place];  Service: Vascular;  Laterality: N/A;  . INGUINAL HERNIA REPAIR Right 2005  . MOHS SURGERY  09/2014; ~ 2014   BCC; nose and left ear  . POSTERIOR FUSION LUMBAR SPINE  10/15/1982   "the 2 lowest discs fused"  . ULTRASOUND GUIDANCE FOR VASCULAR ACCESS Right 03/07/2020   Procedure: ULTRASOUND GUIDANCE FOR VASCULAR ACCESS, right femoral artery;  Surgeon: Rosetta Posner, MD;  [facility] OR;  Service: Vascular;  Laterality: Right;   Family  History  Problem Relation Age of Onset  . AAA (abdominal aortic aneurysm) Father        sounds like died of DIC- otherwise healthy  . Peripheral vascular disease Father        Aneurysm- Aorta  . AAA (abdominal aortic aneurysm) Brother        states multiple medicle issues but died of pulmonary fibrosis- was told smoking related  . Heart disease Brother 16       Before age 61  . Heart attack Brother        x2  . Alcohol abuse Brother        sober 25 years when he died  . Prostate cancer Brother   . Cancer Mother        Stomach   Social History   Socioeconomic History  . Marital status: Married    Spouse name: Not on file  . Number of children: 2  . Years of education: Not on file  . Highest education level: Not on file  Occupational History  . 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 Smoking status: Never Smoker  . Smokeless tobacco: Never Used  Vaping Use  . Vaping Use: Never used  Substance and Sexual Activity  . Alcohol use: No    Comment: 04/13/2017 "recovering alcoholic; [id number]"  . Drug use: No  . Sexual activity: Not Currently  Other Topics Concern  . Not on file  Social History Narrative   Married to wife Garrie Woodin (patient of mine). 2 kids- 1 local. No grandkids and wont have them. No pets.       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 N N  Vision? N N  Difficulty concentrating or making decisions? N N  Walking or climbing stairs? Y N  Dressing or bathing? N N  Doing errands, shopping? N N  Some recent data might be hidden    Patient Care Team: Marin Olp, MD as PCP - General (Family Medicine) Laurence Spates, MD (  Inactive) as Consulting Physician (Gastroenterology) Dermatology, [facility] as Consulting Physician  Indicate any recent [place] you may have received from other than [facility] providers in the past year (date may be approximate).     Assessment:   This is a routine wellness examination for Jahaziel.  Hearing/Vision screen No exam data present  Dietary issues and exercise activities discussed:    Goals   None    Depression Screen PHQ 2/9 Scores 04/03/2020 03/18/2019 12/14/2017 05/29/2016 12/10/2015  PHQ - 2 Score 0 0 0 0 0    Fall Risk Fall Risk  04/03/2020 03/18/2019 01/25/2019 12/14/2017 05/29/2016  Falls in the past year? 0 0 0 No No  Comment - - Emmi Telephone Survey: data to providers prior to load - -  Number falls in past yr: 0 0 - - -  Injury with Fall? 0 0 - - -  Follow up - Falls evaluation completed;Education provided;Falls prevention discussed - - -    FALL RISK PREVENTION PERTAINING TO THE HOME:  Any stairs in or around the home? {YES/NO:21197} If so, are there any without handrails? {YES/NO:21197} Home free of loose throw rugs in walkways, pet beds, electrical cords, etc? Yes  Adequate lighting in your home to reduce risk of falls? Yes   ASSISTIVE DEVICES UTILIZED TO PREVENT FALLS:  Life alert? Yes  Use of a cane, walker or w/c? {YES/NO:21197} Grab bars in the bathroom? {YES/NO:21197} Shower chair or bench in shower? {YES/NO:21197} Elevated toilet seat or a handicapped toilet? {YES/NO:21197}  TIMED UP AND GO:  Was the test performed? Yes .   Length of time to ambulate 10 feet: *** sec.   {Appearance of JHER:[id number]}  Cognitive Function:     6CIT Screen 03/18/2019  What Year? 0 points  What month? 0 points  What time? 0 points  Count back from 20 0 points  Months in reverse 0 points  Repeat phrase 0 points  Total Score 0    Immunizations Immunization History  Administered Date(s) Administered  . BCG 12/14/2017  . DT (Pediatric) 08/20/2017  . Fluad Quad(high Dose 65+) 12/13/2018  . Influenza, High Dose Seasonal PF 12/10/2015, 12/05/2016, 12/14/2017  . Influenza,inj,Quad PF,6+ Mos 12/08/2019  . Influenza-Unspecified 12/01/2012, 11/28/2016  . Moderna Sars-Covid-2 Vaccination 03/15/2019, 04/12/2019, 01/04/2020  . PPD Test 12/14/2017  . Pneumococcal Conjugate-13 05/29/2016  . Pneumococcal Polysaccharide-23 08/07/2017  . Rabies, IM 08/20/2017, 08/23/2017, 09/03/2017  . Tdap 08/20/2017  . Zoster 07/14/2019, 12/21/2019    TDAP status: Up to date  Flu Vaccine status: Up to date  Pneumococcal vaccine status: Up to date  Covid-19 vaccine status: Completed vaccines  Qualifies for Shingles Vaccine? Yes   Zostavax completed Yes   Shingrix Completed?: No.    Education has been provided regarding the importance of this vaccine. Patient has been advised to call insurance company to determine out of pocket expense if they have not yet received this vaccine. Advised may also receive vaccine at local pharmacy or Health Dept. Verbalized acceptance and understanding.  Screening Tests Health Maintenance  Topic Date Due  . INFLUENZA VACCINE  10/01/2020  . TETANUS/TDAP  08/21/2027  . COVID-19 Vaccine  Completed  . PNA vac Low Risk Adult  Completed  . HPV VACCINES  Aged Out    Health Maintenance  There are no preventive care reminders to display for this patient.  Colorectal cancer screening: No longer required.    Additional Screening:   Vision Screening: Recommended annual ophthalmology exams for early  detection of glaucoma and  other disorders of the eye. Is the patient up to date with their annual eye exam?  {YES/NO:21197} Who is the provider or what is the name of the office in which the patient attends annual eye exams? *** If pt is not established with a provider, would they like to be referred to a provider to establish care? {YES/NO:21197}.   Dental Screening: Recommended annual dental exams for proper oral hygiene  Community Resource Referral / Chronic Care Management: CRR required this visit?  {YES/NO:21197}  CCM required this visit?  {YES/NO:21197}     Plan:     I have personally reviewed and noted the following in the patient's chart:   . Medical and social history . Use of alcohol, tobacco or illicit drugs  . Current medications and supplements . Functional ability and status . Nutritional status . Physical activity . Advanced directives . List of other physicians . Hospitalizations, surgeries, and ER visits in previous 12 months . Vitals . Screenings to include cognitive, depression, and falls . Referrals and appointments  In addition, I have reviewed and discussed with patient certain preventive protocols, quality metrics, and best practice recommendations. A written personalized care plan for preventive services as well as general preventive health recommendations were provided to patient.     Willette Brace, LPN   0/27/2536   Nurse Notes: ***

## 2020-06-22 ENCOUNTER — Telehealth: Payer: Self-pay

## 2020-06-22 NOTE — Telephone Encounter (Signed)
error 

## 2020-06-28 ENCOUNTER — Encounter: Payer: Self-pay | Admitting: Family Medicine

## 2020-06-28 ENCOUNTER — Other Ambulatory Visit: Payer: Self-pay

## 2020-06-28 ENCOUNTER — Ambulatory Visit (INDEPENDENT_AMBULATORY_CARE_PROVIDER_SITE_OTHER): Payer: PPO | Admitting: Family Medicine

## 2020-06-28 VITALS — BP 136/72 | HR 54 | Temp 98.0°F | Ht 70.0 in | Wt 163.8 lb

## 2020-06-28 DIAGNOSIS — K4090 Unilateral inguinal hernia, without obstruction or gangrene, not specified as recurrent: Secondary | ICD-10-CM | POA: Diagnosis not present

## 2020-06-28 NOTE — Progress Notes (Signed)
   Charles Stephenson is a 86 y.o. male who presents today for an office visit.  Assessment/Plan:  New/Acute Problems: Right inguinal hernia No red flags.  Will place referral to surgery for further evaluation.  Can use over-the-counter tylenol as needed for pain and discomfort.  Discussed warning signs for incarcerated hernia and reasons to seek emergent care.    Subjective:  HPI:  Patient here with concern for right inguinal hernia.  He had left inguinal hernia about 15 to 20 years ago that was repaired.  Over the last couple days he has noticed a bulge in her right inguinal area.  Very small amount of discomfort in the area as well.  No nausea or vomiting.  No specific injuries or precipitating events.  No specific treatments tried.       Objective:  Physical Exam: BP 136/72   Pulse (!) 54   Temp 98 F (36.7 C) (Temporal)   Ht 5\' 10"  (1.778 m)   Wt 163 lb 12.8 oz (74.3 kg)   SpO2 100%   BMI 23.50 kg/m   Gen: No acute distress, resting comfortably CV: Regular rate and rhythm with no murmurs appreciated Pulm: Normal work of breathing, clear to auscultation bilaterally with no crackles, wheezes, or rhonchi GU: Obvious right inguinal mass noted.  Easily reducible.  Worsen with Valsalva maneuver. Neuro: Grossly normal, moves all extremities Psych: Normal affect and thought content      Bailey Kolbe M. Jerline Pain, MD 06/28/2020 10:09 AM

## 2020-06-28 NOTE — Patient Instructions (Signed)
It was very nice to see you today!  You have a hernia.  I will place a referral for you to see a surgeon to have this taken care of.  Please let us know if you have any severe symptoms or severe pain.  Take care, Dr Jerline Pain  PLEASE NOTE:  If you had any lab tests please let us know if you have not heard back within a few days. You may see your results on mychart before we have a chance to review them but we will give you a call once they are reviewed by [place]. If we ordered any referrals today, please let us know if you have not heard from their office within the next week.   Please try these tips to maintain a healthy lifestyle:   Eat at least 3 REAL meals and 1-2 snacks per day.  Aim for no more than 5 hours between eating.  If you eat breakfast, please do so within one hour of getting up.    Each meal should contain half fruits/vegetables, one quarter protein, and one quarter carbs (no bigger than a computer mouse)   Cut down on sweet beverages. This includes juice, soda, and sweet tea.     Drink at least 1 glass of water with each meal and aim for at least 8 glasses per day   Exercise at least 150 minutes every week.

## 2020-07-10 ENCOUNTER — Encounter: Payer: Self-pay | Admitting: Family Medicine

## 2020-07-20 DIAGNOSIS — K4090 Unilateral inguinal hernia, without obstruction or gangrene, not specified as recurrent: Secondary | ICD-10-CM | POA: Diagnosis not present

## 2020-08-01 HISTORY — PX: HERNIA REPAIR: SHX51

## 2020-08-14 DIAGNOSIS — K4090 Unilateral inguinal hernia, without obstruction or gangrene, not specified as recurrent: Secondary | ICD-10-CM | POA: Diagnosis not present

## 2020-09-12 DIAGNOSIS — H40003 Preglaucoma, unspecified, bilateral: Secondary | ICD-10-CM | POA: Diagnosis not present

## 2020-09-27 DIAGNOSIS — D485 Neoplasm of uncertain behavior of skin: Secondary | ICD-10-CM | POA: Diagnosis not present

## 2020-09-27 DIAGNOSIS — L570 Actinic keratosis: Secondary | ICD-10-CM | POA: Diagnosis not present

## 2020-09-27 DIAGNOSIS — D044 Carcinoma in situ of skin of scalp and neck: Secondary | ICD-10-CM | POA: Diagnosis not present

## 2020-09-27 DIAGNOSIS — Z85828 Personal history of other malignant neoplasm of skin: Secondary | ICD-10-CM | POA: Diagnosis not present

## 2020-09-27 DIAGNOSIS — L280 Lichen simplex chronicus: Secondary | ICD-10-CM | POA: Diagnosis not present

## 2020-10-16 ENCOUNTER — Other Ambulatory Visit: Payer: Self-pay

## 2020-10-16 DIAGNOSIS — I713 Abdominal aortic aneurysm, ruptured, unspecified: Secondary | ICD-10-CM

## 2020-10-18 ENCOUNTER — Telehealth (INDEPENDENT_AMBULATORY_CARE_PROVIDER_SITE_OTHER): Payer: PPO | Admitting: Physician Assistant

## 2020-10-18 ENCOUNTER — Encounter: Payer: Self-pay | Admitting: Physician Assistant

## 2020-10-18 ENCOUNTER — Other Ambulatory Visit: Payer: Self-pay

## 2020-10-18 VITALS — BP 121/78 | Temp 97.6°F | Ht 70.0 in

## 2020-10-18 DIAGNOSIS — Z20822 Contact with and (suspected) exposure to covid-19: Secondary | ICD-10-CM

## 2020-10-18 DIAGNOSIS — R0981 Nasal congestion: Secondary | ICD-10-CM

## 2020-10-18 MED ORDER — FLUTICASONE PROPIONATE 50 MCG/ACT NA SUSP: 2.0000 | Freq: Every day | NASAL | 6 refills | Status: DC

## 2020-10-18 MED ORDER — BENZONATATE 100 MG PO CAPS: 100.0000 mg | ORAL_CAPSULE | Freq: Three times a day (TID) | ORAL | 0 refills | Status: DC | PRN

## 2020-10-18 MED ORDER — AMOXICILLIN-POT CLAVULANATE 875-125 MG PO TABS: 1.0000 | ORAL_TABLET | Freq: Two times a day (BID) | ORAL | 0 refills | Status: DC

## 2020-10-18 NOTE — Progress Notes (Signed)
Virtual Visit via Video Note  I connected with  Charles Stephenson  on 10/18/20 at 11:30 AM EDT by a video enabled telemedicine application and verified that I am speaking with the correct person using two identifiers.  Location: Patient: home in [place] Provider: Therapist, music at [employer] present: Patient and myself   I discussed the limitations of evaluation and management by telemedicine and the availability of in person appointments. The patient expressed understanding and agreed to proceed.   History of Present Illness: Chief complaint: Sinus congestion, sneezing, slight headache Symptom onset: Yesterday afternoon Pertinent positives: Slight tightness in chest Pertinent negatives: SOB, chest pain, fever, chills, body aches Treatments tried: Nothing yet Vaccine status: Moderna x 4 Sick exposure: COVID exposure in his choir in the last week  States that he took a home COVID test and it was negative last night.  He says this feels exactly like his previous sinus infections and is requesting antibiotics.    Observations/Objective:  Gen: Awake, alert, no acute distress, some congestion noted Resp: Breathing is even and non-labored Psych: calm/pleasant demeanor Neuro: Alert and Oriented x 3, + facial symmetry, speech is clear.   Assessment and Plan: 1. Exposure to COVID-19 virus 2. Sinus congestion Discussed with patient that I am concerned he may still have COVID-19 infection and that he tested too early, resulting in a false negative.  He plans to retest in 2 days.  I did instruct him to test again tomorrow if he is feeling worse.  He should also call the office if he is having any worse or change in symptoms.  At this time I do think he should just treat conservatively with treatment such as Tessalon Perles, nasal saline, daily allergy medication, and Flonase.  I informed him that antibiotics are actually rarely indicated for sinus infections and usually  these just needs 7 to 10 days or longer to resolve on their own.  I did fill a prescription for Augmentin to start on Sunday, 10/21/2020, if his tests for COVID continue to be negative and he continues to have sinus pressure and sinus infection symptoms.  He agrees and is understanding of plan.   Follow Up Instructions:    I discussed the assessment and treatment plan with the patient. The patient was provided an opportunity to ask questions and all were answered. The patient agreed with the plan and demonstrated an understanding of the instructions.   The patient was advised to call back or seek an in-person evaluation if the symptoms worsen or if the condition fails to improve as anticipated.  Janicia Monterrosa M Ruger Saxer, PA-C

## 2020-10-19 ENCOUNTER — Telehealth: Payer: Self-pay

## 2020-10-19 ENCOUNTER — Other Ambulatory Visit: Payer: Self-pay | Admitting: Physician Assistant

## 2020-10-19 MED ORDER — NIRMATRELVIR/RITONAVIR (PAXLOVID)TABLET: 3.0000 | ORAL_TABLET | Freq: Two times a day (BID) | ORAL | 0 refills | Status: AC

## 2020-10-19 NOTE — Telephone Encounter (Signed)
Patient tested positive today for covid requesting treatment.Requesting [facility] on [address]

## 2020-10-19 NOTE — Telephone Encounter (Signed)
Patient notified he voices understanding.

## 2020-10-22 ENCOUNTER — Telehealth: Payer: Self-pay

## 2020-10-22 NOTE — Telephone Encounter (Signed)
Patient is requesting a call back, patient is now 3 days into his medication he was given on 8/18 and states he is progressively worse and would like to dicsuss what other medications he can take.

## 2020-10-22 NOTE — Telephone Encounter (Signed)
Noted  

## 2020-10-22 NOTE — Telephone Encounter (Signed)
Patient called back in stating that he has explained the situation with several people, he was transferred to triage and missed a call from the nurse but wasn't able to return the call so he called [place] back. Advised it is best to get him scheduled for an appointment, so he is scheduled tomorrow with Dr.Kim and was advised to try out the urgent care E-VISIT.

## 2020-10-23 ENCOUNTER — Telehealth (INDEPENDENT_AMBULATORY_CARE_PROVIDER_SITE_OTHER): Payer: PPO | Admitting: Family Medicine

## 2020-10-23 DIAGNOSIS — U071 COVID-19: Secondary | ICD-10-CM

## 2020-10-23 DIAGNOSIS — R0981 Nasal congestion: Secondary | ICD-10-CM | POA: Diagnosis not present

## 2020-10-23 DIAGNOSIS — J029 Acute pharyngitis, unspecified: Secondary | ICD-10-CM

## 2020-10-23 NOTE — Progress Notes (Signed)
Virtual Visit via Video Note  I connected with Charles Stephenson  on 10/23/20 at 12:20 PM EDT by a video enabled telemedicine application and verified that I am speaking with the correct person using two identifiers.  Location patient: home, [place] Location provider:work or home office Persons participating in the virtual visit: patient, provider, patient's wife here  I discussed the limitations of evaluation and management by telemedicine and the availability of in person appointments. The patient expressed understanding and agreed to proceed.   HPI:  Acute telemedicine visit for Covid19: -Onset: about 1 week ago; tested positive for covid, PCP office put him on Paxlovid on day 2 of symptoms -Symptoms include: started with nasal congestion, scratchy throat, sore throat -had a really sore throat yesterday and continued intermittent cough -reports is doing better today -Denies: fevers, CP, SOB, malaise, inability to eat/drink/get out of bed -Pertinent past medical history:see below -Pertinent medication allergies: No Known Allergies -COVID-19 vaccine status: fully vaccinated and boosted x2  ROS: See pertinent positives and negatives per HPI.  Past Medical History:  Diagnosis Date   AAA (abdominal aortic aneurysm) ([place])    Basal cell carcinoma    02/09/2020: patient denies   Diverticulitis    3x through 18 months around 2012   Hepatitis    02/29/2020: patient denies hx   History of alcohol abuse    sober since age 55   History of vertigo    december 2007    Hypercholesterolemia    Inguinal hernia 2005   left side per patient   Seasonal allergies    no rx    Past Surgical History:  Procedure Laterality Date   ABDOMINAL AORTIC ENDOVASCULAR STENT GRAFT  04/13/2017   w/EMBOLIZATION LEFT HYPOGASTRIC USING INTERLOCK-35 DETACHABLE COIL/notes 04/13/2017   ABDOMINAL AORTIC ENDOVASCULAR STENT GRAFT N/A 04/13/2017   Procedure: ABDOMINAL AORTIC ENDOVASCULAR STENT GRAFT;  Surgeon: Rosetta Posner, MD;   Location: [facility];  Service: Vascular;  Laterality: N/A;   AORTOGRAM N/A 03/07/2020   Procedure: AORTOGRAM with insertion of  Aortic Cuff;  Surgeon: Rosetta Posner, MD;  Location: [facility];  Service: Vascular;  Laterality: N/A;   Aortogram and placement of proximal aortic cuff  03/07/2020   BACK SURGERY     BASAL CELL CARCINOMA EXCISION  X~ 9   "chest, back, left leg" (04/13/2017)   CARDIOVASCULAR STRESS TEST  Jan. 14,2014   CATARACT EXTRACTION W/ INTRAOCULAR LENS  IMPLANT, BILATERAL Bilateral    L pupil larger after lens implant   COLONOSCOPY     EMBOLIZATION N/A 04/13/2017   Procedure: EMBOLIZATION LEFT HYPOGASTRIC USING INTERLOCK-35 DETACHABLE COIL;  Surgeon: Rosetta Posner, MD;  Location: [facility];  Service: Vascular;  Laterality: N/A;   INGUINAL HERNIA REPAIR Right 2005   MOHS SURGERY  09/2014; ~ 2014   [facility]CC; nose and left ear   POSTERIOR FUSION LUMBAR SPINE  10/15/1982   "the 2 lowest discs fused"   ULTRASOUND GUIDANCE FOR VASCULAR ACCESS Right 03/07/2020   Procedure: ULTRASOUND GUIDANCE FOR VASCULAR ACCESS, right femoral artery;  Surgeon: Rosetta Posner, MD;  Location: [facility];  Service: Vascular;  Laterality: Right;     Current Outpatient Medications:    amoxicillin-clavulanate (AUGMENTIN) 875-125 MG tablet, Take 1 tablet by mouth 2 (two) times daily. Take with food. Do not start prior to 10/21/20., Disp: 20 tablet, Rfl: 0   atorvastatin (LIPITOR) 20 MG tablet, Take 1 tablet (20 mg total) by mouth daily., Disp: 90 tablet, Rfl: 3   benzonatate (TESSALON PERLES)  100 MG capsule, Take 1 capsule (100 mg total) by mouth 3 (three) times daily as needed for cough., Disp: 20 capsule, Rfl: 0   fluticasone (FLONASE) 50 MCG/ACT nasal spray, Place 2 sprays into both nostrils daily., Disp: 16 g, Rfl: 6   loratadine (CLARITIN) 10 MG tablet, Take 10 mg by mouth daily as needed for allergies., Disp: , Rfl:    METAMUCIL FIBER PO, Take 5 capsules by mouth at bedtime., Disp: , Rfl:    Multiple Vitamins-Minerals (CENTRUM  SILVER PO), Take 1 tablet by mouth daily. , Disp: , Rfl:    Multiple Vitamins-Minerals (ICAPS AREDS FORMULA PO), Take 1 tablet by mouth 2 (two) times daily., Disp: , Rfl:    nirmatrelvir/ritonavir EUA (PAXLOVID) 20 x 150 MG & 10 x '100MG'$  TABS, Take 3 tablets by mouth 2 (two) times daily for 5 days. (Take nirmatrelvir 150 mg two tablets twice daily for 5 days and ritonavir 100 mg one tablet twice daily for 5 days) Patient GFR is 84, Disp: 30 tablet, Rfl: 0   Sulfacetamide Sodium-Sulfur 9-4.5 % LIQD, Apply 1 application topically every other day., Disp: , Rfl:    VITAMIN D PO, Take 2 capsules by mouth daily., Disp: , Rfl:   EXAM:  VITALS per patient if applicable:  GENERAL: alert, oriented, appears well and in no acute distress  HEENT: atraumatic, conjunttiva clear, no obvious abnormalities on inspection of external nose and ears  NECK: normal movements of the head and neck  LUNGS: on inspection no signs of respiratory distress, breathing rate appears normal, no obvious gross SOB, gasping or wheezing  CV: no obvious cyanosis  MS: moves all visible extremities without noticeable abnormality  PSYCH/NEURO: pleasant and cooperative, no obvious depression or anxiety, speech and thought processing grossly intact  ASSESSMENT AND PLAN:  Discussed the following assessment and plan:  COVID-19  Nasal congestion  Sore throat  -we discussed possible serious and likely etiologies, options for evaluation and workup, limitations of telemedicine visit vs in person visit, treatment, treatment risks and precautions.  Opted to go ahead and continue the course of Paxil COVID, treat the sore throat with warm liquids, salt water gargles and Aleve.  Also discussed nasal saline and possibly a short course of nasal decongestant for the nasal drainage.  I am glad he is starting to improve today.  Advised to seek prompt in person care if worsening, new symptoms arise, or if is not improving with treatment.  Discussed options for inperson care if PCP office not available. Did let this patient know that I only do telemedicine on Tuesdays and Thursdays for [place]. Advised to schedule follow up visit with PCP or [facility] if any further questions or concerns to avoid delays in care.   I discussed the assessment and treatment plan with the patient. The patient was provided an opportunity to ask questions and all were answered. The patient agreed with the plan and demonstrated an understanding of the instructions.     Lucretia Kern, DO

## 2020-10-23 NOTE — Patient Instructions (Addendum)
  HOME CARE TIPS:  -Elizebeth Koller out the course of the antiviral  -can use tylenol or aleve if needed for fevers, aches and pains per instructions  -can use nasal saline a few times per day if you have nasal congestion; sometimes  a short course of Afrin nasal spray for 3 days can help with symptoms as well  -Gargling warm salt water several times a day can sometimes also help with the sore throat  -stay hydrated, drink plenty of fluids and eat small healthy meals - avoid dairy  -follow up with your doctor in 2-3 days unless improving and feeling better  -stay home while sick, except to seek medical care. If you have COVID19, ideally it would be best to stay home for a full 10 days since the onset of symptoms PLUS one day of no fever and feeling better. Wear a good mask that fits snugly (such as N95 or KN95) if around others to reduce the risk of transmission.  It was nice to meet you today, and I really hope you are feeling better soon. I help [place] out with telemedicine visits on Tuesdays and Thursdays and am available for visits on those days. If you have any concerns or questions following this visit please schedule a follow up visit with your Primary Care doctor or seek care at a local urgent care clinic to avoid delays in care.    Seek in person care or schedule a follow up video visit promptly if your symptoms worsen, new concerns arise or you are not improving with treatment. Call 911 and/or seek emergency care if your symptoms are severe or life threatening.

## 2020-10-25 ENCOUNTER — Other Ambulatory Visit: Payer: Self-pay

## 2020-10-25 DIAGNOSIS — I713 Abdominal aortic aneurysm, ruptured, unspecified: Secondary | ICD-10-CM

## 2020-10-26 ENCOUNTER — Encounter (HOSPITAL_COMMUNITY): Payer: Self-pay

## 2020-10-26 ENCOUNTER — Ambulatory Visit (HOSPITAL_COMMUNITY): Payer: PPO

## 2020-10-31 ENCOUNTER — Ambulatory Visit: Payer: PPO | Admitting: Vascular Surgery

## 2020-10-31 ENCOUNTER — Telehealth: Payer: Self-pay

## 2020-10-31 ENCOUNTER — Encounter: Payer: Self-pay | Admitting: Family Medicine

## 2020-10-31 NOTE — Telephone Encounter (Signed)
This has been addressed via previous phone encounter.

## 2020-10-31 NOTE — Telephone Encounter (Signed)
Called and spoke with pt and below information given.

## 2020-10-31 NOTE — Telephone Encounter (Signed)
Patient called in with wife saying that they tested positive and both did virtual visits and have finished the anti-viral medication. They are concerned because they are still testing positive, but Charles Stephenson still has a little bit of stuffiness and they do not know if it is safe for them to return daily tasks and going out in public.

## 2020-10-31 NOTE — Telephone Encounter (Signed)
Ok for pt to resume normal activity?

## 2020-10-31 NOTE — Telephone Encounter (Signed)
Please give standard information on ending self-isolation from the CDC  You may end isolation after day 5 but continue to mask until day 10 if:  You are fever-free for 24 hours (without the use of fever-reducing medication) Your symptoms are improving  I generally do not test to end isolation but use the above rules- it sounds like he is improving and fever free so can ease back into normal activities

## 2020-11-15 ENCOUNTER — Encounter (HOSPITAL_COMMUNITY): Payer: Self-pay

## 2020-11-15 ENCOUNTER — Ambulatory Visit (HOSPITAL_COMMUNITY)
Admission: RE | Admit: 2020-11-15 | Discharge: 2020-11-15 | Disposition: A | Discharge status: Home / Self Care | Payer: PPO | Source: Ambulatory Visit | Attending: Vascular Surgery | Admitting: Vascular Surgery

## 2020-11-15 ENCOUNTER — Other Ambulatory Visit: Payer: Self-pay

## 2020-11-15 DIAGNOSIS — I714 Abdominal aortic aneurysm, without rupture: Secondary | ICD-10-CM | POA: Diagnosis not present

## 2020-11-15 DIAGNOSIS — I713 Abdominal aortic aneurysm, ruptured, unspecified: Secondary | ICD-10-CM

## 2020-11-15 DIAGNOSIS — K5730 Diverticulosis of large intestine without perforation or abscess without bleeding: Secondary | ICD-10-CM | POA: Diagnosis not present

## 2020-11-15 DIAGNOSIS — J9811 Atelectasis: Secondary | ICD-10-CM | POA: Diagnosis not present

## 2020-11-15 LAB — POCT I-STAT CREATININE: Creatinine, Ser: 0.8 mg/dL (ref 0.61–1.24)

## 2020-11-15 MED ORDER — IOHEXOL 350 MG/ML SOLN
100.0000 mL | Freq: Once | INTRAVENOUS | Status: AC | PRN
  Administered 2020-11-15: 100 mL via INTRAVENOUS

## 2020-11-15 MED ORDER — IOHEXOL 350 MG/ML SOLN: 75.0000 mL | Freq: Once | INTRAVENOUS | Status: DC | PRN

## 2020-11-19 ENCOUNTER — Ambulatory Visit (INDEPENDENT_AMBULATORY_CARE_PROVIDER_SITE_OTHER): Payer: PPO

## 2020-11-19 ENCOUNTER — Encounter: Payer: Self-pay | Admitting: Family Medicine

## 2020-11-19 DIAGNOSIS — Z0000 Encounter for general adult medical examination without abnormal findings: Secondary | ICD-10-CM

## 2020-11-19 NOTE — Patient Instructions (Addendum)
[name] , Thank you for taking time to come for your Medicare Wellness Visit. I appreciate your ongoing commitment to your health goals. Please review the following plan we discussed and let me know if I can assist you in the future.   Screening recommendations/referrals: Colonoscopy: no longer required  Recommended yearly ophthalmology/optometry visit for glaucoma screening and checkup Recommended yearly dental visit for hygiene and checkup  Vaccinations: Influenza vaccine: due  Pneumococcal vaccine: Completed  Tdap vaccine: Done 08/20/17 repeat every 10 years  Shingles vaccine: pt stated completed    Covid-19: Completed 1/12, 2/9, 01/04/20 & 06/20/20  Advanced directives: Please bring a copy of your health care power of attorney and living will to the office at your convenience.   Conditions/risks identified: Continue exercise routine  Next appointment: Follow up in one year for your annual wellness visit.   Preventive Care 84 Years and Older, Male Preventive care refers to lifestyle choices and visits with your health care provider that can promote health and wellness. What does preventive care include? A yearly physical exam. This is also called an annual well check. Dental exams once or twice a year. Routine eye exams. Ask your health care provider how often you should have your eyes checked. Personal lifestyle choices, including: Daily care of your teeth and gums. Regular physical activity. Eating a healthy diet. Avoiding tobacco and drug use. Limiting alcohol use. Practicing safe sex. Taking low doses of aspirin every day. Taking vitamin and mineral supplements as recommended by your health care provider. What happens during an annual well check? The services and screenings done by your health care provider during your annual well check will depend on your age, overall health, lifestyle risk factors, and family history of disease. Counseling  Your health care provider  may ask you questions about your: Alcohol use. Tobacco use. Drug use. Emotional well-being. Home and relationship well-being. Sexual activity. Eating habits. History of falls. Memory and ability to understand (cognition). Work and work Statistician. Screening  You may have the following tests or measurements: Height, weight, and BMI. Blood pressure. Lipid and cholesterol levels. These may be checked every 5 years, or more frequently if you are over 62 years old. Skin check. Lung cancer screening. You may have this screening every year starting at age 75 if you have a 30-pack-year history of smoking and currently smoke or have quit within the past 15 years. Fecal occult blood test (FOBT) of the stool. You may have this test every year starting at age 41. Flexible sigmoidoscopy or colonoscopy. You may have a sigmoidoscopy every 5 years or a colonoscopy every 10 years starting at age 64. Prostate cancer screening. Recommendations will vary depending on your family history and other risks. Hepatitis C blood test. Hepatitis B blood test. Sexually transmitted disease (STD) testing. Diabetes screening. This is done by checking your blood sugar (glucose) after you have not eaten for a while (fasting). You may have this done every 1-3 years. Abdominal aortic aneurysm (AAA) screening. You may need this if you are a current or former smoker. Osteoporosis. You may be screened starting at age 63 if you are at high risk. Talk with your health care provider about your test results, treatment options, and if necessary, the need for more tests. Vaccines  Your health care provider may recommend certain vaccines, such as: Influenza vaccine. This is recommended every year. Tetanus, diphtheria, and acellular pertussis (Tdap, Td) vaccine. You may need a Td booster every 10 years. Zoster vaccine. You  may need this after age 86. Pneumococcal 13-valent conjugate (PCV13) vaccine. One dose is recommended after  age 37. Pneumococcal polysaccharide (PPSV23) vaccine. One dose is recommended after age 8. Talk to your health care provider about which screenings and vaccines you need and how often you need them. This information is not intended to replace advice given to you by your health care provider. Make sure you discuss any questions you have with your health care provider. Document Released: 03/16/2015 Document Revised: 11/07/2015 Document Reviewed: 12/19/2014 Elsevier Interactive Patient Education  2017 [employer] Prevention in the Home Falls can cause injuries. They can happen to people of all ages. There are many things you can do to make your home safe and to help prevent falls. What can I do on the outside of my home? Regularly fix the edges of walkways and driveways and fix any cracks. Remove anything that might make you trip as you walk through a door, such as a raised step or threshold. Trim any bushes or trees on the path to your home. Use bright outdoor lighting. Clear any walking paths of anything that might make someone trip, such as rocks or tools. Regularly check to see if handrails are loose or broken. Make sure that both sides of any steps have handrails. Any raised decks and porches should have guardrails on the edges. Have any leaves, snow, or ice cleared regularly. Use sand or salt on walking paths during winter. Clean up any spills in your garage right away. This includes oil or grease spills. What can I do in the bathroom? Use night lights. Install grab bars by the toilet and in the tub and shower. Do not use towel bars as grab bars. Use non-skid mats or decals in the tub or shower. If you need to sit down in the shower, use a plastic, non-slip stool. Keep the floor dry. Clean up any water that spills on the floor as soon as it happens. Remove soap buildup in the tub or shower regularly. Attach bath mats securely with double-sided non-slip rug tape. Do not have  throw rugs and other things on the floor that can make you trip. What can I do in the bedroom? Use night lights. Make sure that you have a light by your bed that is easy to reach. Do not use any sheets or blankets that are too big for your bed. They should not hang down onto the floor. Have a firm chair that has side arms. You can use this for support while you get dressed. Do not have throw rugs and other things on the floor that can make you trip. What can I do in the kitchen? Clean up any spills right away. Avoid walking on wet floors. Keep items that you use a lot in easy-to-reach places. If you need to reach something above you, use a strong step stool that has a grab bar. Keep electrical cords out of the way. Do not use floor polish or wax that makes floors slippery. If you must use wax, use non-skid floor wax. Do not have throw rugs and other things on the floor that can make you trip. What can I do with my stairs? Do not leave any items on the stairs. Make sure that there are handrails on both sides of the stairs and use them. Fix handrails that are broken or loose. Make sure that handrails are as long as the stairways. Check any carpeting to make sure that it is firmly  attached to the stairs. Fix any carpet that is loose or worn. Avoid having throw rugs at the top or bottom of the stairs. If you do have throw rugs, attach them to the floor with carpet tape. Make sure that you have a light switch at the top of the stairs and the bottom of the stairs. If you do not have them, ask someone to add them for you. What else can I do to help prevent falls? Wear shoes that: Do not have high heels. Have rubber bottoms. Are comfortable and fit you well. Are closed at the toe. Do not wear sandals. If you use a stepladder: Make sure that it is fully opened. Do not climb a closed stepladder. Make sure that both sides of the stepladder are locked into place. Ask someone to hold it for you, if  possible. Clearly mark and make sure that you can see: Any grab bars or handrails. First and last steps. Where the edge of each step is. Use tools that help you move around (mobility aids) if they are needed. These include: Canes. Walkers. Scooters. Crutches. Turn on the lights when you go into a dark area. Replace any light bulbs as soon as they burn out. Set up your furniture so you have a clear path. Avoid moving your furniture around. If any of your floors are uneven, fix them. If there are any pets around you, be aware of where they are. Review your medicines with your doctor. Some medicines can make you feel dizzy. This can increase your chance of falling. Ask your doctor what other things that you can do to help prevent falls. This information is not intended to replace advice given to you by your health care provider. Make sure you discuss any questions you have with your health care provider. Document Released: 12/14/2008 Document Revised: 07/26/2015 Document Reviewed: 03/24/2014 Elsevier Interactive Patient Education  2017 [employer].

## 2020-11-19 NOTE — Progress Notes (Signed)
Virtual Visit via Telephone Note  I connected with  Charles Stephenson on 11/19/20 at 11:00 AM EDT by telephone and verified that I am speaking with the correct person using two identifiers.  Medicare Annual Wellness visit completed telephonically due to Covid-19 pandemic.   Persons participating in this call: This Health Coach and this patient.   Location: Patient: home Provider: office   I discussed the limitations, risks, security and privacy concerns of performing an evaluation and management service by telephone and the availability of in person appointments. The patient expressed understanding and agreed to proceed.  Unable to perform video visit due to video visit attempted and failed and/or patient does not have video capability.   Some vital signs may be absent or patient reported.   Willette Brace, LPN   Subjective:   Charles Stephenson is a 86 y.o. male who presents for Medicare Annual/Subsequent preventive examination.  Review of Systems     Cardiac Risk Factors include: advanced age (>14men, >24 women);dyslipidemia     Objective:    There were no vitals filed for this visit. There is no height or weight on file to calculate BMI.  Advanced Directives 11/19/2020 03/07/2020 03/07/2020 02/29/2020 03/18/2019 04/13/2017 04/08/2017  Does Patient Have a Medical Advance Directive? Yes Yes - Yes Yes Yes Yes  Type of Paramedic of [place];Living will - [place];Living will Living will;Healthcare Power of Attorney Living will [place];Living will  Does patient want to make changes to medical advance directive? - No - Patient declined No - Patient declined No - Patient declined No - Patient declined No - Patient declined No - Patient declined  Copy of [place] in Chart? No - copy requested Yes - validated most recent copy scanned in chart (See row information) - Yes -  validated most recent copy scanned in chart (See row information) No - copy requested - No - copy requested    Current Medications (verified) Outpatient Encounter Medications as of 11/19/2020  Medication Sig   atorvastatin (LIPITOR) 20 MG tablet Take 1 tablet (20 mg total) by mouth daily.   METAMUCIL FIBER PO Take 5 capsules by mouth at bedtime.   Multiple Vitamins-Minerals (CENTRUM SILVER PO) Take 1 tablet by mouth daily.    Multiple Vitamins-Minerals (ICAPS AREDS FORMULA PO) Take 1 tablet by mouth 2 (two) times daily.   VITAMIN D PO Take 2 capsules by mouth daily.   loratadine (CLARITIN) 10 MG tablet Take 10 mg by mouth daily as needed for allergies. (Patient not taking: Reported on 11/19/2020)   [DISCONTINUED] amoxicillin-clavulanate (AUGMENTIN) 875-125 MG tablet Take 1 tablet by mouth 2 (two) times daily. Take with food. Do not start prior to 10/21/20. (Patient not taking: Reported on 11/19/2020)   [DISCONTINUED] benzonatate (TESSALON PERLES) 100 MG capsule Take 1 capsule (100 mg total) by mouth 3 (three) times daily as needed for cough. (Patient not taking: Reported on 11/19/2020)   [DISCONTINUED] fluticasone (FLONASE) 50 MCG/ACT nasal spray Place 2 sprays into both nostrils daily.   [DISCONTINUED] Sulfacetamide Sodium-Sulfur 9-4.5 % LIQD Apply 1 application topically every other day.   No facility-administered encounter medications on file as of 11/19/2020.    Allergies (verified) Patient has no known allergies.   History: Past Medical History:  Diagnosis Date   AAA (abdominal aortic aneurysm) ([place])    Basal cell carcinoma    02/09/2020: patient denies   Diverticulitis    3x  through 18 months around 2012   Hepatitis    02/29/2020: patient denies hx   History of alcohol abuse    sober since age 20   History of vertigo    december 2007    Hypercholesterolemia    Inguinal hernia 2005   left side per patient   Seasonal allergies    no rx   Past Surgical History:  Procedure  Laterality Date   ABDOMINAL AORTIC ENDOVASCULAR STENT GRAFT  04/13/2017   w/EMBOLIZATION LEFT HYPOGASTRIC USING INTERLOCK-35 DETACHABLE COIL/notes 04/13/2017   ABDOMINAL AORTIC ENDOVASCULAR STENT GRAFT N/A 04/13/2017   Procedure: ABDOMINAL AORTIC ENDOVASCULAR STENT GRAFT;  Surgeon: Rosetta Posner, MD;  Location: [facility];  Service: Vascular;  Laterality: N/A;   AORTOGRAM N/A 03/07/2020   Procedure: AORTOGRAM with insertion of  Aortic Cuff;  Surgeon: Rosetta Posner, MD;  Location: [facility];  Service: Vascular;  Laterality: N/A;   Aortogram and placement of proximal aortic cuff  03/07/2020   BACK SURGERY     BASAL CELL CARCINOMA EXCISION  X~ 9   "chest, back, left leg" (04/13/2017)   CARDIOVASCULAR STRESS TEST  Jan. 14,2014   CATARACT EXTRACTION W/ INTRAOCULAR LENS  IMPLANT, BILATERAL Bilateral    L pupil larger after lens implant   COLONOSCOPY     EMBOLIZATION N/A 04/13/2017   Procedure: EMBOLIZATION LEFT HYPOGASTRIC USING INTERLOCK-35 DETACHABLE COIL;  Surgeon: Rosetta Posner, MD;  Location: [facility];  Service: Vascular;  Laterality: N/A;   INGUINAL HERNIA REPAIR Right 2005   MOHS SURGERY  09/2014; ~ 2014   BCC; nose and left ear   POSTERIOR FUSION LUMBAR SPINE  10/15/1982   "the 2 lowest discs fused"   ULTRASOUND GUIDANCE FOR VASCULAR ACCESS Right 03/07/2020   Procedure: ULTRASOUND GUIDANCE FOR VASCULAR ACCESS, right femoral artery;  Surgeon: Rosetta Posner, MD;  Location: [facility];  Service: Vascular;  Laterality: Right;   Family History  Problem Relation Age of Onset   AAA (abdominal aortic aneurysm) Father        sounds like died of DIC- otherwise healthy   Peripheral vascular disease Father        Aneurysm- Aorta   AAA (abdominal aortic aneurysm) Brother        states multiple medicle issues but died of pulmonary fibrosis- was told smoking related   Heart disease Brother 37       Before age 88   Heart attack Brother        x2   Alcohol abuse Brother        sober 31 years when he died   Prostate  cancer Brother    Cancer Mother        Stomach   Social History   Socioeconomic History   Marital status: Married    Spouse name: Not on file   Number of children: 2   Years of education: Not on file   Highest education level: Not on file  Occupational History   Occupation: Retired    Comment: Programme researcher, 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 2 kids- 1 local. No grandkids and wont have them. No pets.       Retired in 2008 from [employer] to [employer]: golfing 2-3 days a week, down to place in [place]- walk at [employer], walks 2.5 miles daily, service work      Lives in [place] Strain: Low Risk    Difficulty of Paying Living Expenses: Not hard at [employer] Insecurity: No Food Insecurity   Worried About Charity fundraiser in the Last Year: Never true   Arboriculturist in the Last Year: Never true  Transportation Needs: No Transportation Needs   Lack of Transportation (Medical): No   Lack of Transportation (Non-Medical): No  Physical Activity: Sufficiently Active   Days of Exercise per Week: 7 days   Minutes of Exercise per Session: 90 min  Stress: No Stress Concern Present   Feeling of Stress : Not at all  Social Connections: Socially Integrated   Frequency of Communication with Friends and Family: More than three times a week   Frequency of Social Gatherings with Friends and Family: More than three times a week   Attends Religious Services: More than 4 times per year   Active Member of [employer] or  Organizations: Yes   Attends Archivist Meetings: 1 to 4 times per year   Marital Status: Married    Tobacco Counseling Counseling given: Not Answered   Clinical Intake:  Pre-visit preparation completed: Yes  Pain : No/denies pain     BMI - recorded: 23.5 Nutritional Status: BMI of 19-24  Normal Nutritional Risks: None Diabetes: No  How often do you need to have someone help you when you read instructions, pamphlets, or other written materials from your doctor or pharmacy?: 1 - Never  Diabetic?No   Interpreter Needed?: No  Information entered by :: Charlott Rakes, LPN   Activities of Daily Living In your present state of health, do you have any difficulty performing the following activities: 11/19/2020 03/07/2020  Hearing? N N  Vision? N N  Difficulty concentrating or making decisions? N N  Walking or climbing stairs? N Y  Dressing or bathing? N N  Doing errands, shopping? N N  Preparing Food and eating ? N -  Using the Toilet? N -  In the past six months, have you accidently leaked urine? N -  Do you have problems with loss of bowel control? N -  Managing your Medications? N -  Managing your Finances? N -  Housekeeping or managing your Housekeeping? N -  Some recent data might be hidden    Patient Care Team: Marin Olp, MD as PCP - General (Family Medicine) Laurence Spates, MD (Inactive) as Consulting Physician (Gastroenterology) Dermatology, [facility] as Consulting Physician  Indicate any recent Medical Services you may have received from other than [facility] providers in the past year (date may be approximate).     Assessment:   This is a routine wellness examination for Charles Stephenson.  Hearing/Vision screen Hearing Screening - Comments:: Pt denies any hearing issues  Vision Screening - Comments:: Pt follows up with Dr Luvenia Starch in [place]   Dietary issues and exercise activities discussed: Current Exercise Habits: Home exercise routine,  Type of exercise: walking;Other - see comments (golf), Time (Minutes): > 60, Frequency (Times/Week): 7, Weekly Exercise (Minutes/Week): 0   Goals Addressed  This Visit's Progress    Patient Stated       Continue exercise routine        Depression Screen PHQ 2/9 Scores 11/19/2020 04/03/2020 03/18/2019 12/14/2017 05/29/2016 12/10/2015  PHQ - 2 Score 0 0 0 0 0 0    Fall Risk Fall Risk  11/19/2020 04/03/2020 03/18/2019 01/25/2019 12/14/2017  Falls in the past year? 0 0 0 0 No  Comment - - - Emmi Telephone Survey: data to providers prior to load -  Number falls in past yr: 0 0 0 - -  Injury with Fall? 0 0 0 - -  Risk for fall due to : Impaired vision - - - -  Follow up Falls prevention discussed - Falls evaluation completed;Education provided;Falls prevention discussed - -    FALL RISK PREVENTION PERTAINING TO THE HOME:  Any stairs in or around the home? No  If so, are there any without handrails? No  Home free of loose throw rugs in walkways, pet beds, electrical cords, etc? Yes  Adequate lighting in your home to reduce risk of falls? Yes   ASSISTIVE DEVICES UTILIZED TO PREVENT FALLS:  Life alert? Yes  Use of a cane, walker or w/c? No  Grab bars in the bathroom? Yes  Shower chair or bench in shower? No Elevated toilet seat or a handicapped toilet? Yes   TIMED UP AND GO:  Was the test performed? No .   Cognitive Function:     6CIT Screen 11/19/2020 03/18/2019  What Year? 0 points 0 points  What month? 0 points 0 points  What time? 0 points 0 points  Count back from 20 0 points 0 points  Months in reverse 0 points 0 points  Repeat phrase 0 points 0 points  Total Score 0 0    Immunizations Immunization History  Administered Date(s) Administered   BCG 12/14/2017   DT (Pediatric) 08/20/2017   Fluad Quad(high Dose 65+) 12/13/2018   Influenza, High Dose Seasonal PF 12/10/2015, 12/05/2016, 12/14/2017   Influenza,inj,Quad PF,6+ Mos 12/08/2019    Influenza-Unspecified 12/01/2012, 11/28/2016   Moderna Sars-Covid-2 Vaccination 03/15/2019, 04/12/2019, 01/04/2020, 06/20/2020   PPD Test 12/14/2017   Pneumococcal Conjugate-13 05/29/2016   Pneumococcal Polysaccharide-23 08/07/2017   Rabies, IM 08/20/2017, 08/23/2017, 09/03/2017   Tdap 08/20/2017   Zoster, Live 07/14/2019, 12/21/2019    TDAP status: Up to date  Flu Vaccine status: Due, Education has been provided regarding the importance of this vaccine. Advised may receive this vaccine at local pharmacy or Health Dept. Aware to provide a copy of the vaccination record if obtained from local pharmacy or Health Dept. Verbalized acceptance and understanding.  Pneumococcal vaccine status: Up to date  Covid-19 vaccine status: Completed vaccines  Qualifies for Shingles Vaccine? Yes   Zostavax completed Yes   Shingrix Completed?: Yes  Screening Tests Health Maintenance  Topic Date Due   Zoster Vaccines- Shingrix (1 of 2) Never done   INFLUENZA VACCINE  10/01/2020   TETANUS/TDAP  08/21/2027   COVID-19 Vaccine  Completed   HPV VACCINES  Aged Out    Health Maintenance  Health Maintenance Due  Topic Date Due   Zoster Vaccines- Shingrix (1 of 2) Never done   INFLUENZA VACCINE  10/01/2020    Colorectal cancer screening: No longer required.   Additional Screening:   Vision Screening: Recommended annual ophthalmology exams for early detection of glaucoma and other disorders of the eye. Is the patient up to date with their annual eye exam?  Yes  Who is  the provider or what is the name of the office in which the patient attends annual eye exams? Dr Luvenia Starch If pt is not established with a provider, would they like to be referred to a provider to establish care? No .   Dental Screening: Recommended annual dental exams for proper oral hygiene  Community Resource Referral / Chronic Care Management: CRR required this visit?  No   CCM required this visit?  No      Plan:      I have personally reviewed and noted the following in the patient's chart:   Medical and social history Use of alcohol, tobacco or illicit drugs  Current medications and supplements including opioid prescriptions. Patient is not currently taking opioid prescriptions. Functional ability and status Nutritional status Physical activity Advanced directives List of other physicians Hospitalizations, surgeries, and ER visits in previous 12 months Vitals Screenings to include cognitive, depression, and falls Referrals and appointments  In addition, I have reviewed and discussed with patient certain preventive protocols, quality metrics, and best practice recommendations. A written personalized care plan for preventive services as well as general preventive health recommendations were provided to patient.     Willette Brace, LPN   [id number]   Nurse Notes: Pt stated that he submitted date of completion for shingrix and with an email at last visit with staff,  I was unable to locate please advise

## 2020-11-21 ENCOUNTER — Ambulatory Visit: Payer: PPO | Admitting: Vascular Surgery

## 2020-11-21 ENCOUNTER — Encounter: Payer: Self-pay | Admitting: Vascular Surgery

## 2020-11-21 ENCOUNTER — Other Ambulatory Visit: Payer: Self-pay

## 2020-11-21 VITALS — BP 107/69 | HR 70 | Temp 97.3°F | Wt 162.0 lb

## 2020-11-21 DIAGNOSIS — Z95828 Presence of other vascular implants and grafts: Secondary | ICD-10-CM | POA: Diagnosis not present

## 2020-11-21 NOTE — Progress Notes (Signed)
V[facility] and [facility]in Specialist of [place]  Patient name: Charles Stephenson MRN: [id number] DOB: Jul 12, 1934 Sex: male  REASON FOR VISIT: Follow-up stent graft repair abdominal aortic aneurysm  HPI: Charles Stephenson is a 86 y.o. male here today for follow-up.  He continues to be in excellent health.  He does report having COVID which was relatively mild aside from a severe sore throat.  His wife also had COVID.  She and he have both recovered nicely.  He initially had stent graft repair abdominal arctic aneurysm in February 2019.  His initial postop CT scan showed no evidence of type I endoleak.  He did have type II endoleak from lumbar vessels.  Subsequent ultrasound follow-up showed some progression in size of his aneurysm sac.  He had CT scan in December 2021 suggesting expansion to 5.7 cm.  There was some concern regarding a possible type I endoleak at the proximal attachment site and he underwent placement of a aortic cuff at the proximal attachment site.  He did well with this.  He is here today for follow-up.  He did undergo CT scan on 11/15/2020  Past Medical History:  Diagnosis Date   AAA (abdominal aortic aneurysm) ([place])    Basal cell carcinoma    02/09/2020: patient denies   Diverticulitis    3x through 18 months around 2012   Hepatitis    02/29/2020: patient denies hx   History of alcohol abuse    sober since age 63   History of vertigo    december 2007    Hypercholesterolemia    Inguinal hernia 2005   left side per patient   Seasonal allergies    no rx    Family History  Problem Relation Age of Onset   AAA (abdominal aortic aneurysm) Father        sounds like died of DIC- otherwise healthy   Peripheral vascular disease Father        Aneurysm- Aorta   AAA (abdominal aortic aneurysm) Brother        states multiple medicle issues but died of pulmonary fibrosis- was told smoking related   Heart disease Brother 25       Before  age 71   Heart attack Brother        x2   Alcohol abuse Brother        sober 25 years when he died   Prostate cancer Brother    Cancer Mother        Stomach    SOCIAL HISTORY: Social History   Tobacco Use   Smoking status: Never   Smokeless tobacco: Never  Substance Use Topics   Alcohol use: No    Comment: 04/13/2017 "recovering alcoholic; [id number]"    No Known Allergies  Current Outpatient Medications  Medication Sig Dispense Refill   atorvastatin (LIPITOR) 20 MG tablet Take 1 tablet (20 mg total) by mouth daily. 90 tablet 3   loratadine (CLARITIN) 10 MG tablet Take 10 mg by mouth daily as needed for allergies.     METAMUCIL FIBER PO Take 5 capsules by mouth at bedtime.     Multiple Vitamins-Minerals (CENTRUM SILVER PO) Take 1 tablet by mouth daily.      Multiple Vitamins-Minerals (ICAPS AREDS 2 PO) Take by mouth.     Multiple Vitamins-Minerals (ICAPS AREDS FORMULA PO) Take 1 tablet by mouth 2 (two) times daily.     Polyethyl Glycol-Propyl Glycol (SYSTANE) 0.4-0.3 % SOLN Apply to eye in the morning and at  bedtime.     VITAMIN D PO Take 2 capsules by mouth daily.     No current facility-administered medications for this visit.    REVIEW OF SYSTEMS:  [X]  denotes positive finding, [ ]  denotes negative finding Cardiac  Comments:  Chest pain or chest pressure:    Shortness of breath upon exertion:    Short of breath when lying flat:    Irregular heart rhythm:        Vascular    Pain in calf, thigh, or hip brought on by ambulation:    Pain in feet at night that wakes you up from your sleep:     Blood clot in your veins:    Leg swelling:           PHYSICAL EXAM: Vitals:   11/21/20 1101  BP: 107/69  Pulse: 70  Temp: (!) 97.3 F (36.3 C)  TempSrc: Skin  SpO2: 96%  Weight: 162 lb (73.5 kg)    GENERAL: The patient is a well-nourished male, in no acute distress. The vital signs are documented above. CARDIOVASCULAR: He has an easily palpable aneurysm.  He is very  thin and this is not expansile.  Nontender. PULMONARY: There is good air exchange  MUSCULOSKELETAL: There are no major deformities or cyanosis. NEUROLOGIC: No focal weakness or paresthesias are detected. SKIN: There are no ulcers or rashes noted. PSYCHIATRIC: The patient has a normal affect.  DATA:  I reviewed his CT scan images with him.  He has had no change in the maximal diameter of his aneurysm.  Radiologist interpretation is now 5.8 versus 5.7 on 1 axis but a significant diminished size on the other access.  He continues to have a persistent type II endoleak from lumbar vessels.  MEDICAL ISSUES: Stable overall.  I had an extensive discussion regarding the significance of his type II endoleak and that this usually does not cause any difficulty and is very common finding after stent graft repair.  He will continue his usual activities without limitation.  I will see him again in 1 year with repeat CT scan of abdomen and pelvis follow-up    Rosetta Posner, MD FACS [facility] [phone]  Note: Portions of this report may have been transcribed using voice recognition software.  Every effort has been made to ensure accuracy; however, inadvertent computerized transcription errors may still be present.

## 2020-12-10 DIAGNOSIS — C44311 Basal cell carcinoma of skin of nose: Secondary | ICD-10-CM | POA: Diagnosis not present

## 2020-12-10 DIAGNOSIS — Z85828 Personal history of other malignant neoplasm of skin: Secondary | ICD-10-CM | POA: Diagnosis not present

## 2020-12-10 DIAGNOSIS — L821 Other seborrheic keratosis: Secondary | ICD-10-CM | POA: Diagnosis not present

## 2020-12-10 DIAGNOSIS — L814 Other melanin hyperpigmentation: Secondary | ICD-10-CM | POA: Diagnosis not present

## 2020-12-10 DIAGNOSIS — L308 Other specified dermatitis: Secondary | ICD-10-CM | POA: Diagnosis not present

## 2020-12-10 DIAGNOSIS — L820 Inflamed seborrheic keratosis: Secondary | ICD-10-CM | POA: Diagnosis not present

## 2020-12-10 DIAGNOSIS — L570 Actinic keratosis: Secondary | ICD-10-CM | POA: Diagnosis not present

## 2020-12-10 DIAGNOSIS — L858 Other specified epidermal thickening: Secondary | ICD-10-CM | POA: Diagnosis not present

## 2020-12-10 DIAGNOSIS — L720 Epidermal cyst: Secondary | ICD-10-CM | POA: Diagnosis not present

## 2020-12-10 DIAGNOSIS — D485 Neoplasm of uncertain behavior of skin: Secondary | ICD-10-CM | POA: Diagnosis not present

## 2020-12-10 DIAGNOSIS — D1801 Hemangioma of skin and subcutaneous tissue: Secondary | ICD-10-CM | POA: Diagnosis not present

## 2020-12-10 DIAGNOSIS — D225 Melanocytic nevi of trunk: Secondary | ICD-10-CM | POA: Diagnosis not present

## 2021-01-09 DIAGNOSIS — C44311 Basal cell carcinoma of skin of nose: Secondary | ICD-10-CM | POA: Diagnosis not present

## 2021-01-09 DIAGNOSIS — Z85828 Personal history of other malignant neoplasm of skin: Secondary | ICD-10-CM | POA: Diagnosis not present

## 2021-02-04 DIAGNOSIS — H04123 Dry eye syndrome of bilateral lacrimal glands: Secondary | ICD-10-CM | POA: Diagnosis not present

## 2021-02-04 DIAGNOSIS — H40003 Preglaucoma, unspecified, bilateral: Secondary | ICD-10-CM | POA: Diagnosis not present

## 2021-03-12 ENCOUNTER — Other Ambulatory Visit: Payer: Self-pay | Admitting: Family Medicine

## 2021-03-12 DIAGNOSIS — E785 Hyperlipidemia, unspecified: Secondary | ICD-10-CM

## 2021-03-12 DIAGNOSIS — Z0000 Encounter for general adult medical examination without abnormal findings: Secondary | ICD-10-CM

## 2021-03-20 DIAGNOSIS — H61001 Unspecified perichondritis of right external ear: Secondary | ICD-10-CM | POA: Diagnosis not present

## 2021-03-20 DIAGNOSIS — L570 Actinic keratosis: Secondary | ICD-10-CM | POA: Diagnosis not present

## 2021-03-20 DIAGNOSIS — Z85828 Personal history of other malignant neoplasm of skin: Secondary | ICD-10-CM | POA: Diagnosis not present

## 2021-05-13 DIAGNOSIS — S161XXA Strain of muscle, fascia and tendon at neck level, initial encounter: Secondary | ICD-10-CM | POA: Diagnosis not present

## 2021-05-16 DIAGNOSIS — C44622 Squamous cell carcinoma of skin of right upper limb, including shoulder: Secondary | ICD-10-CM | POA: Diagnosis not present

## 2021-05-16 DIAGNOSIS — D0462 Carcinoma in situ of skin of left upper limb, including shoulder: Secondary | ICD-10-CM | POA: Diagnosis not present

## 2021-05-16 DIAGNOSIS — L814 Other melanin hyperpigmentation: Secondary | ICD-10-CM | POA: Diagnosis not present

## 2021-05-16 DIAGNOSIS — L821 Other seborrheic keratosis: Secondary | ICD-10-CM | POA: Diagnosis not present

## 2021-05-16 DIAGNOSIS — D485 Neoplasm of uncertain behavior of skin: Secondary | ICD-10-CM | POA: Diagnosis not present

## 2021-05-16 DIAGNOSIS — C4441 Basal cell carcinoma of skin of scalp and neck: Secondary | ICD-10-CM | POA: Diagnosis not present

## 2021-05-16 DIAGNOSIS — C44319 Basal cell carcinoma of skin of other parts of face: Secondary | ICD-10-CM | POA: Diagnosis not present

## 2021-05-16 DIAGNOSIS — Z85828 Personal history of other malignant neoplasm of skin: Secondary | ICD-10-CM | POA: Diagnosis not present

## 2021-05-16 DIAGNOSIS — L570 Actinic keratosis: Secondary | ICD-10-CM | POA: Diagnosis not present

## 2021-05-16 DIAGNOSIS — L853 Xerosis cutis: Secondary | ICD-10-CM | POA: Diagnosis not present

## 2021-05-16 DIAGNOSIS — D1801 Hemangioma of skin and subcutaneous tissue: Secondary | ICD-10-CM | POA: Diagnosis not present

## 2021-05-16 DIAGNOSIS — D225 Melanocytic nevi of trunk: Secondary | ICD-10-CM | POA: Diagnosis not present

## 2021-05-24 ENCOUNTER — Encounter: Payer: Self-pay | Admitting: Family Medicine

## 2021-06-10 ENCOUNTER — Telehealth: Payer: Self-pay

## 2021-06-10 DIAGNOSIS — Z0000 Encounter for general adult medical examination without abnormal findings: Secondary | ICD-10-CM

## 2021-06-10 DIAGNOSIS — E785 Hyperlipidemia, unspecified: Secondary | ICD-10-CM

## 2021-06-10 MED ORDER — ATORVASTATIN CALCIUM 20 MG PO TABS: ORAL_TABLET | ORAL | 3 refills | Status: DC

## 2021-06-10 NOTE — Telephone Encounter (Signed)
Refill sent to pharmacy.   

## 2021-06-10 NOTE — Telephone Encounter (Signed)
..   Encourage patient to contact the pharmacy for refills or they can request refills through [place]:  04/03/2020  NEXT APPOINTMENT DATE: 09/05/2021  MEDICATION: atorvastatin  Is the patient out of medication?   PHARMACY:  [facility]  Let patient know to contact pharmacy at the end of the day to make sure medication is ready.  Please notify patient to allow 48-72 hours to process

## 2021-06-25 DIAGNOSIS — Z85828 Personal history of other malignant neoplasm of skin: Secondary | ICD-10-CM | POA: Diagnosis not present

## 2021-06-25 DIAGNOSIS — C44319 Basal cell carcinoma of skin of other parts of face: Secondary | ICD-10-CM | POA: Diagnosis not present

## 2021-07-30 NOTE — Progress Notes (Signed)
This encounter was created in error - please disregard.  This encounter was created in error - please disregard.

## 2021-08-08 DIAGNOSIS — H43813 Vitreous degeneration, bilateral: Secondary | ICD-10-CM | POA: Diagnosis not present

## 2021-08-08 DIAGNOSIS — H52203 Unspecified astigmatism, bilateral: Secondary | ICD-10-CM | POA: Diagnosis not present

## 2021-08-08 DIAGNOSIS — H04123 Dry eye syndrome of bilateral lacrimal glands: Secondary | ICD-10-CM | POA: Diagnosis not present

## 2021-08-08 DIAGNOSIS — H02055 Trichiasis without entropian left lower eyelid: Secondary | ICD-10-CM | POA: Diagnosis not present

## 2021-08-08 DIAGNOSIS — Z961 Presence of intraocular lens: Secondary | ICD-10-CM | POA: Diagnosis not present

## 2021-08-08 DIAGNOSIS — H524 Presbyopia: Secondary | ICD-10-CM | POA: Diagnosis not present

## 2021-08-08 DIAGNOSIS — H40003 Preglaucoma, unspecified, bilateral: Secondary | ICD-10-CM | POA: Diagnosis not present

## 2021-08-08 DIAGNOSIS — H353131 Nonexudative age-related macular degeneration, bilateral, early dry stage: Secondary | ICD-10-CM | POA: Diagnosis not present

## 2021-09-05 ENCOUNTER — Ambulatory Visit (INDEPENDENT_AMBULATORY_CARE_PROVIDER_SITE_OTHER): Payer: PPO | Admitting: Family Medicine

## 2021-09-05 ENCOUNTER — Encounter: Payer: Self-pay | Admitting: Family Medicine

## 2021-09-05 VITALS — BP 100/60 | HR 55 | Temp 98.2°F | Ht 70.0 in | Wt 155.0 lb

## 2021-09-05 DIAGNOSIS — Z8679 Personal history of other diseases of the circulatory system: Secondary | ICD-10-CM | POA: Diagnosis not present

## 2021-09-05 DIAGNOSIS — I714 Abdominal aortic aneurysm, without rupture, unspecified: Secondary | ICD-10-CM | POA: Diagnosis not present

## 2021-09-05 DIAGNOSIS — M79671 Pain in right foot: Secondary | ICD-10-CM | POA: Diagnosis not present

## 2021-09-05 DIAGNOSIS — Z0000 Encounter for general adult medical examination without abnormal findings: Secondary | ICD-10-CM | POA: Diagnosis not present

## 2021-09-05 DIAGNOSIS — Z9889 Other specified postprocedural states: Secondary | ICD-10-CM | POA: Diagnosis not present

## 2021-09-05 DIAGNOSIS — E785 Hyperlipidemia, unspecified: Secondary | ICD-10-CM | POA: Diagnosis not present

## 2021-09-05 LAB — COMPREHENSIVE METABOLIC PANEL
ALT: 25 U/L (ref 0–53)
AST: 28 U/L (ref 0–37)
Albumin: 4.5 g/dL (ref 3.5–5.2)
Alkaline Phosphatase: 55 U/L (ref 39–117)
BUN: 19 mg/dL (ref 6–23)
CO2: 31 mEq/L (ref 19–32)
Calcium: 9.9 mg/dL (ref 8.4–10.5)
Chloride: 101 mEq/L (ref 96–112)
Creatinine, Ser: 1.02 mg/dL (ref 0.40–1.50)
GFR: 67.26 mL/min (ref >60.00)
Glucose, Bld: 89 mg/dL (ref 70–99)
Potassium: 4.1 mEq/L (ref 3.5–5.1)
Sodium: 138 mEq/L (ref 135–145)
Total Bilirubin: 0.9 mg/dL (ref 0.2–1.2)
Total Protein: 7.2 g/dL (ref 6.0–8.3)

## 2021-09-05 LAB — CBC WITH DIFFERENTIAL/PLATELET
Basophils Absolute: 0 10*3/uL (ref 0.0–0.1)
Basophils Relative: 0.7 % (ref 0.0–3.0)
Eosinophils Absolute: 0.1 10*3/uL (ref 0.0–0.7)
Eosinophils Relative: 2.4 % (ref 0.0–5.0)
HCT: 40.6 % (ref 39.0–52.0)
Hemoglobin: 13.4 g/dL (ref 13.0–17.0)
Lymphocytes Relative: 19.3 % (ref 12.0–46.0)
Lymphs Abs: 1.1 10*3/uL (ref 0.7–4.0)
MCHC: 33.1 g/dL (ref 30.0–36.0)
MCV: 91.6 fl (ref 78.0–100.0)
Monocytes Absolute: 0.7 10*3/uL (ref 0.1–1.0)
Monocytes Relative: 12.3 % — ABNORMAL HIGH (ref 3.0–12.0)
Neutro Abs: 3.9 10*3/uL (ref 1.4–7.7)
Neutrophils Relative %: 65.3 % (ref 43.0–77.0)
Platelets: 171 10*3/uL (ref 150.0–400.0)
RBC: 4.43 Mil/uL (ref 4.22–5.81)
RDW: 13.7 % (ref 11.5–15.5)
WBC: 5.9 10*3/uL (ref 4.0–10.5)

## 2021-09-05 LAB — LIPID PANEL
Cholesterol: 145 mg/dL (ref 0–200)
HDL: 64 mg/dL (ref >39.00)
LDL Cholesterol: 64 mg/dL (ref 0–99)
NonHDL: 80.89
Total CHOL/HDL Ratio: 2
Triglycerides: 82 mg/dL (ref 0.0–149.0)
VLDL: 16.4 mg/dL (ref 0.0–40.0)

## 2021-09-05 NOTE — Progress Notes (Signed)
Phone: [phone]   Subjective:  Patient presents today for their annual physical. Chief complaint-noted.   See problem oriented charting- ROS- full  review of systems was completed and negative  except for: neck pIn ( biofreeze and old meloxicam helps, slight chest tightness  The following were reviewed and entered/updated in epic: Past Medical History:  Diagnosis Date   AAA (abdominal aortic aneurysm) ([place])    Basal cell carcinoma    02/09/2020: patient denies   Diverticulitis    3x through 18 months around 2012   Hepatitis    02/29/2020: patient denies hx   History of alcohol abuse    sober since age 11   History of vertigo    december 2007    Hypercholesterolemia    Inguinal hernia 2005   left side per patient   Seasonal allergies    no rx   Patient Active Problem List   Diagnosis Date Noted   History of basal cell carcinoma of skin 12/10/2015    Priority: Medium    Hyperlipidemia 03/12/2012    Priority: Medium    AAA (abdominal aortic aneurysm) ([place]) 03/12/2012    Priority: Medium    Macular degeneration 03/18/2019    Priority: Low   Vertigo 05/29/2016    Priority: Low   Colon cancer screening 04/21/2016    Priority: Low   Seasonal allergies     Priority: Low   Chest pain 03/12/2012    Priority: Low   Type II endoleak of aortic graft 03/07/2020   S/P AAA repair 02/22/2018   Lumbosacral radiculitis 09/04/2017   Low back pain 04/21/2017   Past Surgical History:  Procedure Laterality Date   ABDOMINAL AORTIC ENDOVASCULAR STENT GRAFT  04/13/2017   w/EMBOLIZATION LEFT HYPOGASTRIC USING INTERLOCK-35 DETACHABLE COIL/notes 04/13/2017   ABDOMINAL AORTIC ENDOVASCULAR STENT GRAFT N/A 04/13/2017   Procedure: ABDOMINAL AORTIC ENDOVASCULAR STENT GRAFT;  Surgeon: Rosetta Posner, MD;  Location: [facility];  Service: Vascular;  Laterality: N/A;   AORTOGRAM N/A 03/07/2020   Procedure: AORTOGRAM with insertion of  Aortic Cuff;  Surgeon: Rosetta Posner, MD;  Location: [facility];   Service: Vascular;  Laterality: N/A;   Aortogram and placement of proximal aortic cuff  03/07/2020   BACK SURGERY     BASAL CELL CARCINOMA EXCISION  X~ 9   "chest, back, left leg" (04/13/2017)   CARDIOVASCULAR STRESS TEST  03/16/2012   CATARACT EXTRACTION W/ INTRAOCULAR LENS  IMPLANT, BILATERAL Bilateral    L pupil larger after lens implant   COLONOSCOPY     EMBOLIZATION N/A 04/13/2017   Procedure: EMBOLIZATION LEFT HYPOGASTRIC USING INTERLOCK-35 DETACHABLE COIL;  Surgeon: Rosetta Posner, MD;  Location: [facility];  Service: Vascular;  Laterality: N/A;   HERNIA REPAIR Right 08/2020   INGUINAL HERNIA REPAIR Right 2005   MOHS SURGERY  09/2014; ~ 2014   BCC; nose and left ear   POSTERIOR FUSION LUMBAR SPINE  10/15/1982   "the 2 lowest discs fused"   ULTRASOUND GUIDANCE FOR VASCULAR ACCESS Right 03/07/2020   Procedure: ULTRASOUND GUIDANCE FOR VASCULAR ACCESS, right femoral artery;  Surgeon: Rosetta Posner, MD;  Location: [facility];  Service: Vascular;  Laterality: Right;    Family History  Problem Relation Age of Onset   AAA (abdominal aortic aneurysm) Father        sounds like died of DIC- otherwise healthy   Peripheral vascular disease Father        Aneurysm- Aorta   AAA (abdominal aortic aneurysm) Brother  states multiple medicle issues but died of pulmonary fibrosis- was told smoking related   Heart disease Brother 17       Before age 70   Heart attack Brother        x2   Alcohol abuse Brother        sober 25 years when he died   Prostate cancer Brother    Cancer Mother        Stomach    Medications- reviewed and updated Current Outpatient Medications  Medication Sig Dispense Refill   atorvastatin (LIPITOR) 20 MG tablet TAKE ONE (1) TABLET BY MOUTH EACH DAY 90 tablet 3   loratadine (CLARITIN) 10 MG tablet Take 10 mg by mouth daily as needed for allergies.     METAMUCIL FIBER PO Take 5 capsules by mouth at bedtime.     Multiple Vitamins-Minerals (CENTRUM SILVER PO) Take 1 tablet  by mouth daily.      Multiple Vitamins-Minerals (ICAPS AREDS 2 PO) Take by mouth.     Multiple Vitamins-Minerals (ICAPS AREDS FORMULA PO) Take 1 tablet by mouth 2 (two) times daily.     Polyethyl Glycol-Propyl Glycol (SYSTANE) 0.4-0.3 % SOLN Apply to eye in the morning and at bedtime.     VITAMIN D PO Take 2 capsules by mouth daily.     No current facility-administered medications for this visit.    Allergies-reviewed and updated No Known Allergies  Social History   Social History Narrative   Married to wife Kelsen Celona (patient of mine). 2 kids- 1 local. No grandkids and wont have them. No pets.       Retired in 2008 from [employer] to [employer]: golfing 2-3 days a week, down to place in [place]- walk at [employer], walks 2.5 miles daily, service work      Lives in [place]    Objective  Objective:  BP 100/60   Pulse (!) 55   Temp 98.2 F (36.8 C)   Ht '5\' 10"'$  (1.778 m)   Wt 155 lb (70.3 kg)   SpO2 97%   BMI 22.24 kg/m  Gen: NAD, resting comfortably HEENT: Mucous membranes are moist. Oropharynx normal Neck: no thyromegaly CV: RRR no murmurs rubs or gallops Lungs: CTAB no crackles, wheeze, rhonchi Abdomen: soft/nontender/nondistended/normal bowel sounds. No rebound or guarding.  Ext: no edema, corn on right foot removed but patient continued to have pain in area (refer to podiatry) Skin: warm, dry Neuro: grossly normal, moves all extremities, PERRLA    Assessment and Plan  87 y.o. male presenting for annual physical.  Health Maintenance counseling: 1. Anticipatory guidance: Patient counseled regarding regular dental exams -q 64month, eye exams - on areds and seen yearly with Dr. EAmalia Haileydue to macular degeneration,  avoiding smoking and second hand smoke , limiting alcohol to 2 beverages per day - sober over 40 years (45), no illicit drugs.   2. Risk factor reduction:  Advised patient of need for regular exercise and diet  rich and fruits and vegetables to reduce risk of heart attack and stroke.  Exercise-  walks 4 miles most days, plays golf 3x a week Diet/weight management-slight weight loss from last year- discussed maintaining.  Wt Readings from Last 3 Encounters:  09/05/21 155 lb (70.3 kg)  11/21/20 162 lb (73.5 kg)  06/28/20 163 lb 12.8 oz (74.3 kg)  3. Immunizations/screenings/ancillary studies-plans to consider COVID-19 vaccination in fall when new variation comes out Immunization History  Administered Date(s) Administered   BCG 12/14/2017   DT (Pediatric) 08/20/2017   Fluad Quad(high Dose 65+) 12/13/2018   Influenza, High Dose Seasonal PF 12/10/2015, 12/05/2016, 12/14/2017, 12/08/2019, 12/26/2020   Influenza,inj,Quad PF,6+ Mos 12/08/2019   Influenza-Unspecified 12/01/2012, 11/28/2016   Moderna Covid-19 Vaccine Bivalent Booster 34yr & up 01/30/2021   Moderna Sars-Covid-2 Vaccination 03/15/2019, 04/12/2019, 01/04/2020, 06/20/2020   PPD Test 12/14/2017   Pneumococcal Conjugate-13 05/29/2016   Pneumococcal Polysaccharide-23 08/07/2017   Rabies, IM 08/20/2017, 08/23/2017, 09/03/2017   Tdap 08/20/2017   Zoster Recombinat (Shingrix) 07/14/2019, 12/21/2019   Zoster, Live 07/14/2019, 12/21/2019  4. Prostate cancer screening- past age  based screening recommendations.  No significant change in urinary symptoms  5. Colon cancer screening - no recall due to age after 03/25/2016 colonoscopy with Dr[facility].  There were notes about possibly doing stool cards per my prior notes- he opts out  6. [facility] cancer screening-[facility]. advised regular sunscreen use. Denies worrisome, changing, or new skin lesions.  7. Smoking associated screening (lung cancer screening, AAA screen 65-75, UA)-never smoker 8. STD screening -only active with his wife  Status of chronic or acute concerns   #AAA status postrepair 03/07/2020 with Dr. EDonnetta Hutching S: Continues to have type II  endoleak but overall stable and plans for yearly follow-up with vascular surgery Dr. EDonnetta Hutching A/P: overall stable- continue follow up with vascular   #hyperlipidemia S: Medication:Atorvastatin 20 mg daily Lab Results  Component Value Date   CHOL 153 04/03/2020   HDL 65.30 04/03/2020   LDLCALC 72 04/03/2020   TRIG 77.0 04/03/2020   CHOLHDL 2 04/03/2020  A/P: reasonable control for primary prevention- continue current meds   #Rare reflux?-over-the-counter medications as needed- for last few weeks maybe twice a week notes a very slight chest tightness (1/10)- quickly resolves with zantac- has had similar in the past and plans to retry. Never happens with lifting weights or walking quickly.   No neck pain with these episodes and no SOB or sweatiness  or lightheadedness. Stress testing in 2017 was reassuring- saw Dr. AFletcher Anonmost recently 04/07/17 and cleared for surgery withotu further workup. Has never felt with golf or exercise. He agrees to follow up if worsens or if zantac becomes ineffective  #Inguinal hernia on the right-was referred to general surgery 06/28/2020 and repair 08/2020 with Dr. GHarlow Asa #callous? right foot pain-  tried otc meds without significant help and wife tries to keep it down- has to wear socks -I removed a corn but patient continued to have pain- refer to podiatry for further workup.   Recommended follow up: Return in about 1 year (around 09/06/2022) for physical or sooner if needed.Schedule b4 you leave. Future Appointments  Date Time Provider DBosque 11/28/2021 11:45 AM [facility]    Lab/Order associations: fasting   ICD-10-CM   1. Preventative health care  Z00.00     2. Hyperlipidemia, unspecified hyperlipidemia type  E78.5 CBC with Differential/Platelet    Comprehensive metabolic panel    Lipid panel    3. Abdominal aortic aneurysm (AAA) without rupture, unspecified part ([place]  I71.40     4. S/P AAA repair  Z98.890    Z86.79        No orders of the defined types were placed in this encounter.   Return precautions advised.  SGarret Reddish MD

## 2021-09-05 NOTE — Patient Instructions (Addendum)
Health Maintenance Due  Topic Date Due   COVID-19 Vaccine (6 - Moderna series) 05/30/2021  Consider getting this in fall when new vaccine comes out  Please stop by lab before you go If you have mychart- we will send your results within 3 business days of [place] receiving them.  If you do not have mychart- we will call you about results within 5 business days of [place] receiving them.  *please also note that you will see labs on mychart as soon as they post. I will later go in and write notes on them- will say "notes from Dr. Yong Channel"   Recommended follow up: Return in about 1 year (around 09/06/2022) for physical or sooner if needed.Schedule b4 you leave.

## 2021-09-20 ENCOUNTER — Ambulatory Visit (INDEPENDENT_AMBULATORY_CARE_PROVIDER_SITE_OTHER): Payer: PPO

## 2021-09-20 ENCOUNTER — Ambulatory Visit: Payer: PPO | Admitting: Podiatry

## 2021-09-20 DIAGNOSIS — Q666 Other congenital valgus deformities of feet: Secondary | ICD-10-CM

## 2021-09-20 DIAGNOSIS — Q828 Other specified congenital malformations of skin: Secondary | ICD-10-CM

## 2021-09-20 NOTE — Progress Notes (Signed)
Subjective:  Patient ID: Charles Stephenson, male    DOB: April 15, 1934,  MRN: [id number]  Chief Complaint  Patient presents with   Callouses    Corn on R foot    87 y.o. male presents with the above complaint.  Patient presents with complaint of right submetatarsal 5 porokeratotic lesion with benign skin lesion with underlying pes planovalgus deformity.  Patient states pain for touch is progressive gotten worse.  He states it hurts with ambulation he wanted to get it evaluated he has not seen anyone else prior to seeing me..  He would like to discuss orthotics option as well.  He has not seen anyone else prior to seeing me for this.  He is not a diabetic.   Review of Systems: Negative except as noted in the HPI. Denies N/V/F/Ch.  Past Medical History:  Diagnosis Date   AAA (abdominal aortic aneurysm) ([place])    Basal cell carcinoma    02/09/2020: patient denies   Diverticulitis    3x through 18 months around 2012   Hepatitis    02/29/2020: patient denies hx   History of alcohol abuse    sober since age 6   History of vertigo    december 2007    Hypercholesterolemia    Inguinal hernia 2005   left side per patient   Seasonal allergies    no rx    Current Outpatient Medications:    atorvastatin (LIPITOR) 20 MG tablet, TAKE ONE (1) TABLET BY MOUTH EACH DAY, Disp: 90 tablet, Rfl: 3   loratadine (CLARITIN) 10 MG tablet, Take 10 mg by mouth daily as needed for allergies., Disp: , Rfl:    METAMUCIL FIBER PO, Take 5 capsules by mouth at bedtime., Disp: , Rfl:    Multiple Vitamins-Minerals (CENTRUM SILVER PO), Take 1 tablet by mouth daily. , Disp: , Rfl:    Multiple Vitamins-Minerals (ICAPS AREDS 2 PO), Take by mouth., Disp: , Rfl:    Multiple Vitamins-Minerals (ICAPS AREDS FORMULA PO), Take 1 tablet by mouth 2 (two) times daily., Disp: , Rfl:    Polyethyl Glycol-Propyl Glycol (SYSTANE) 0.4-0.3 % SOLN, Apply to eye in the morning and at bedtime., Disp: , Rfl:    VITAMIN D PO, Take 2  capsules by mouth daily., Disp: , Rfl:   Social History   Tobacco Use  Smoking Status Never  Smokeless Tobacco Never    No Known Allergies Objective:  There were no vitals filed for this visit. There is no height or weight on file to calculate BMI. Constitutional Well developed. Well nourished.  Vascular Dorsalis pedis pulses palpable bilaterally. Posterior tibial pulses palpable bilaterally. Capillary refill normal to all digits.  No cyanosis or clubbing noted. Pedal hair growth normal.  Neurologic Normal speech. Oriented to person, place, and time. Epicritic sensation to light touch grossly present bilaterally.  Dermatologic Hyperkeratotic lesion with central nucleated core noted to right submetatarsal 4.  Pain on palpation to the lesion.  Porokeratotic lesion noted.  No pinpoint bleeding noted gait examination shows pes planovalgus foot structure with plantar atrophy calcaneovalgus to many toe signs unable to recruit the arch with dorsiflexion of the hallux.  Orthopedic: Normal joint ROM without pain or crepitus bilaterally. No visible deformities. No bony tenderness.   Radiographs: None Assessment:   1. Pes planovalgus   2. Porokeratosis    Plan:  Patient was evaluated and treated and all questions answered.  Right submetatarsal 4 porokeratosis -All questions and concerns were discussed with the patient in extensive detail  given the amount of pain that he is having he will benefit from debridement of the lesion using chisel blade to handle the lesion was debrided down to healthy striated tissue.  No complication noted no pinpoint bleeding noted.  Pes planovalgus -I explained to patient the etiology of pes planovalgus and relationship with P forefoot pain and various treatment options were discussed.  Given patient foot structure in the setting of forefoot pain I believe patient will benefit from custom-made orthotics to help control the hindfoot motion support the arch of  the foot and take the stress away from forefoot pain patient agrees with the plan like to proceed with orthotics -Patient was casted for orthotics   No follow-ups on file.

## 2021-09-20 NOTE — Progress Notes (Signed)
Patient presents today to be casted for custom molded orthotics. Dr. Posey Pronto has been treating patient for pes planovalgus.   Impression foam cast was taken. ABN signed.  Patient info-  Shoe size: 11 medium men's  Shoe style: athletic shoe    Weight: 155lbs  Holding molds until [employer] authorization is completed.  Patient will be notified once orthotics arrive in office and reappoint for fitting at that time.

## 2021-09-30 ENCOUNTER — Telehealth: Payer: Self-pay | Admitting: Podiatry

## 2021-09-30 NOTE — Telephone Encounter (Signed)
Pt is calling to check on his [employer] authorization for orthotics and see if this has been ordered. Pt was told he would get a call back in a few days from last visit and hasnt heard anything.   Please advise.

## 2021-10-03 DIAGNOSIS — S161XXA Strain of muscle, fascia and tendon at neck level, initial encounter: Secondary | ICD-10-CM | POA: Diagnosis not present

## 2021-10-07 ENCOUNTER — Telehealth: Payer: Self-pay | Admitting: Podiatry

## 2021-10-07 NOTE — Telephone Encounter (Signed)
Spoke with patient and he wanted to know how much of the orthotics are his responsibility.  Called Mila at [employer] and she said that the patient would be responsible for 20% of orthotics.   Ref # [id number] .   Patient wants to make sure the orthotics have been ordered and that they will be in before his 11/04/21 appointment so he can pick them up at the same time.

## 2021-10-18 DIAGNOSIS — M6281 Muscle weakness (generalized): Secondary | ICD-10-CM | POA: Diagnosis not present

## 2021-10-18 DIAGNOSIS — M542 Cervicalgia: Secondary | ICD-10-CM | POA: Diagnosis not present

## 2021-10-22 DIAGNOSIS — M6281 Muscle weakness (generalized): Secondary | ICD-10-CM | POA: Diagnosis not present

## 2021-10-22 DIAGNOSIS — M542 Cervicalgia: Secondary | ICD-10-CM | POA: Diagnosis not present

## 2021-10-25 DIAGNOSIS — M6281 Muscle weakness (generalized): Secondary | ICD-10-CM | POA: Diagnosis not present

## 2021-10-25 DIAGNOSIS — M542 Cervicalgia: Secondary | ICD-10-CM | POA: Diagnosis not present

## 2021-10-29 DIAGNOSIS — M542 Cervicalgia: Secondary | ICD-10-CM | POA: Diagnosis not present

## 2021-10-29 DIAGNOSIS — M6281 Muscle weakness (generalized): Secondary | ICD-10-CM | POA: Diagnosis not present

## 2021-11-01 DIAGNOSIS — M542 Cervicalgia: Secondary | ICD-10-CM | POA: Diagnosis not present

## 2021-11-01 DIAGNOSIS — M6281 Muscle weakness (generalized): Secondary | ICD-10-CM | POA: Diagnosis not present

## 2021-11-05 DIAGNOSIS — M6281 Muscle weakness (generalized): Secondary | ICD-10-CM | POA: Diagnosis not present

## 2021-11-05 DIAGNOSIS — M542 Cervicalgia: Secondary | ICD-10-CM | POA: Diagnosis not present

## 2021-11-06 ENCOUNTER — Ambulatory Visit: Payer: PPO | Admitting: Podiatry

## 2021-11-06 DIAGNOSIS — Q666 Other congenital valgus deformities of feet: Secondary | ICD-10-CM | POA: Diagnosis not present

## 2021-11-06 DIAGNOSIS — Q828 Other specified congenital malformations of skin: Secondary | ICD-10-CM

## 2021-11-06 NOTE — Progress Notes (Signed)
Subjective:  Patient ID: Charles Stephenson, male    DOB: 02/08/1935,  MRN: [id number]  Chief Complaint  Patient presents with   Foot Orthotics    87 y.o. male presents with the above complaint.  Patient presents with follow-up of bilateral assisted right submetatarsal 5 porokeratotic lesion.  He states is doing a lot better the making changes to the shoes help.  He is here to pick up his orthotics.   Review of Systems: Negative except as noted in the HPI. Denies N/V/F/Ch.  Past Medical History:  Diagnosis Date   AAA (abdominal aortic aneurysm) ([place])    Basal cell carcinoma    02/09/2020: patient denies   Diverticulitis    3x through 18 months around 2012   Hepatitis    02/29/2020: patient denies hx   History of alcohol abuse    sober since age 79   History of vertigo    december 2007    Hypercholesterolemia    Inguinal hernia 2005   left side per patient   Seasonal allergies    no rx    Current Outpatient Medications:    atorvastatin (LIPITOR) 20 MG tablet, TAKE ONE (1) TABLET BY MOUTH EACH DAY, Disp: 90 tablet, Rfl: 3   loratadine (CLARITIN) 10 MG tablet, Take 10 mg by mouth daily as needed for allergies., Disp: , Rfl:    METAMUCIL FIBER PO, Take 5 capsules by mouth at bedtime., Disp: , Rfl:    Multiple Vitamins-Minerals (CENTRUM SILVER PO), Take 1 tablet by mouth daily. , Disp: , Rfl:    Multiple Vitamins-Minerals (ICAPS AREDS 2 PO), Take by mouth., Disp: , Rfl:    Multiple Vitamins-Minerals (ICAPS AREDS FORMULA PO), Take 1 tablet by mouth 2 (two) times daily., Disp: , Rfl:    Polyethyl Glycol-Propyl Glycol (SYSTANE) 0.4-0.3 % SOLN, Apply to eye in the morning and at bedtime., Disp: , Rfl:    VITAMIN D PO, Take 2 capsules by mouth daily., Disp: , Rfl:   Social History   Tobacco Use  Smoking Status Never  Smokeless Tobacco Never    No Known Allergies Objective:  There were no vitals filed for this visit. There is no height or weight on file to calculate  BMI. Constitutional Well developed. Well nourished.  Vascular Dorsalis pedis pulses palpable bilaterally. Posterior tibial pulses palpable bilaterally. Capillary refill normal to all digits.  No cyanosis or clubbing noted. Pedal hair growth normal.  Neurologic Normal speech. Oriented to person, place, and time. Epicritic sensation to light touch grossly present bilaterally.  Dermatologic Hyperkeratotic lesion with central nucleated core noted to right submetatarsal 4.  Pain on palpation to the lesion.  Porokeratotic lesion noted.  No pinpoint bleeding noted gait examination shows pes planovalgus foot structure with plantar atrophy calcaneovalgus to many toe signs unable to recruit the arch with dorsiflexion of the hallux.  Orthopedic: Normal joint ROM without pain or crepitus bilaterally. No visible deformities. No bony tenderness.   Radiographs: None Assessment:   1. Pes planovalgus   2. Porokeratosis     Plan:  Patient was evaluated and treated and all questions answered.  Right submetatarsal 4 porokeratosis -All questions and concerns were discussed with the patient in extensive detail given the amount of pain that he is having he will benefit from debridement of the lesion using chisel blade to handle the lesion was debrided down to healthy striated tissue.  No complication noted no pinpoint bleeding noted.  Pes planovalgus -I explained to patient the etiology of pes  planovalgus and relationship with P forefoot pain and various treatment options were discussed.  Given patient foot structure in the setting of forefoot pain I believe patient will benefit from custom-made orthotics to help control the hindfoot motion support the arch of the foot and take the stress away from forefoot pain patient agrees with the plan like to proceed with orthotics -Orthotics were dispensed and are functioning well.   No follow-ups on file.

## 2021-11-08 DIAGNOSIS — M542 Cervicalgia: Secondary | ICD-10-CM | POA: Diagnosis not present

## 2021-11-08 DIAGNOSIS — M6281 Muscle weakness (generalized): Secondary | ICD-10-CM | POA: Diagnosis not present

## 2021-11-12 DIAGNOSIS — M542 Cervicalgia: Secondary | ICD-10-CM | POA: Diagnosis not present

## 2021-11-12 DIAGNOSIS — M6281 Muscle weakness (generalized): Secondary | ICD-10-CM | POA: Diagnosis not present

## 2021-11-15 DIAGNOSIS — M542 Cervicalgia: Secondary | ICD-10-CM | POA: Diagnosis not present

## 2021-11-15 DIAGNOSIS — M6281 Muscle weakness (generalized): Secondary | ICD-10-CM | POA: Diagnosis not present

## 2021-11-18 DIAGNOSIS — L814 Other melanin hyperpigmentation: Secondary | ICD-10-CM | POA: Diagnosis not present

## 2021-11-18 DIAGNOSIS — L853 Xerosis cutis: Secondary | ICD-10-CM | POA: Diagnosis not present

## 2021-11-18 DIAGNOSIS — L821 Other seborrheic keratosis: Secondary | ICD-10-CM | POA: Diagnosis not present

## 2021-11-18 DIAGNOSIS — L570 Actinic keratosis: Secondary | ICD-10-CM | POA: Diagnosis not present

## 2021-11-18 DIAGNOSIS — Z85828 Personal history of other malignant neoplasm of skin: Secondary | ICD-10-CM | POA: Diagnosis not present

## 2021-11-18 DIAGNOSIS — D1801 Hemangioma of skin and subcutaneous tissue: Secondary | ICD-10-CM | POA: Diagnosis not present

## 2021-11-18 DIAGNOSIS — L720 Epidermal cyst: Secondary | ICD-10-CM | POA: Diagnosis not present

## 2021-11-18 DIAGNOSIS — L2089 Other atopic dermatitis: Secondary | ICD-10-CM | POA: Diagnosis not present

## 2021-11-18 DIAGNOSIS — D225 Melanocytic nevi of trunk: Secondary | ICD-10-CM | POA: Diagnosis not present

## 2021-11-19 DIAGNOSIS — M542 Cervicalgia: Secondary | ICD-10-CM | POA: Diagnosis not present

## 2021-11-19 DIAGNOSIS — M6281 Muscle weakness (generalized): Secondary | ICD-10-CM | POA: Diagnosis not present

## 2021-11-24 ENCOUNTER — Encounter: Payer: Self-pay | Admitting: Family Medicine

## 2021-11-25 ENCOUNTER — Other Ambulatory Visit: Payer: Self-pay

## 2021-11-25 ENCOUNTER — Encounter: Payer: Self-pay | Admitting: *Deleted

## 2021-11-25 DIAGNOSIS — Z95828 Presence of other vascular implants and grafts: Secondary | ICD-10-CM

## 2021-11-26 DIAGNOSIS — M542 Cervicalgia: Secondary | ICD-10-CM | POA: Diagnosis not present

## 2021-11-26 DIAGNOSIS — M6281 Muscle weakness (generalized): Secondary | ICD-10-CM | POA: Diagnosis not present

## 2021-11-28 ENCOUNTER — Ambulatory Visit (INDEPENDENT_AMBULATORY_CARE_PROVIDER_SITE_OTHER): Payer: PPO

## 2021-11-28 DIAGNOSIS — Z0000 Encounter for general adult medical examination without abnormal findings: Secondary | ICD-10-CM | POA: Diagnosis not present

## 2021-11-28 NOTE — Progress Notes (Signed)
Virtual Visit via Telephone Note  I connected with  Charles Stephenson on 11/28/21 at 11:45 AM EDT by telephone and verified that I am speaking with the correct person using two identifiers.  Medicare Annual Wellness visit completed telephonically due to Covid-19 pandemic.   Persons participating in this call: This Health Coach and this patient.   Location: Patient: home Provider: office    I discussed the limitations, risks, security and privacy concerns of performing an evaluation and management service by telephone and the availability of in person appointments. The patient expressed understanding and agreed to proceed.  Unable to perform video visit due to video visit attempted and failed and/or patient does not have video capability.   Some vital signs may be absent or patient reported.   Willette Brace, LPN   Subjective:   Charles Stephenson is a 87 y.o. male who presents for Medicare Annual/Subsequent preventive examination.  Review of Systems     Cardiac Risk Factors include: advanced age (>6mn, >>68women);dyslipidemia;male gender     Objective:    There were no vitals filed for this visit. There is no height or weight on file to calculate BMI.     11/28/2021   11:33 AM 11/19/2020   11:08 AM 03/07/2020    4:47 PM 03/07/2020    9:12 AM 02/29/2020   11:27 AM 03/18/2019    1:29 PM 04/13/2017    2:38 PM  Advanced Directives  Does Patient Have a Medical Advance Directive? Yes Yes Yes  Yes Yes Yes  Type of [place] Healthcare Power of [place]  [place] Living will;Healthcare Power of Attorney Living will  Does patient want to make changes to medical advance directive?   No - Patient declined No - Patient declined No - Patient declined No - Patient declined No - Patient declined  Copy of [place] Chart? No - copy requested  No - copy requested Yes - validated most recent copy scanned in chart (See row information)  Yes - validated most recent copy scanned in chart (See row information) No - copy requested     Current Medications (verified) Outpatient Encounter Medications as of 11/28/2021  Medication Sig   atorvastatin (LIPITOR) 20 MG tablet TAKE ONE (1) TABLET BY MOUTH EACH DAY   Menthol, Topical Analgesic, (BIOFREEZE EX) APPLY FOUR TIMES DAILY AS NEEDED   METAMUCIL FIBER PO Take 5 capsules by mouth at bedtime.   Multiple Vitamins-Minerals (CENTRUM SILVER PO) Take 1 tablet by mouth daily.    Multiple Vitamins-Minerals (ICAPS AREDS 2 PO) Take by mouth.   Polyethyl Glycol-Propyl Glycol (SYSTANE) 0.4-0.3 % SOLN Apply to eye in the morning and at bedtime.   VITAMIN D PO Take 2 capsules by mouth daily.   [DISCONTINUED] loratadine (CLARITIN) 10 MG tablet Take 10 mg by mouth daily as needed for allergies.   [DISCONTINUED] Multiple Vitamins-Minerals (ICAPS AREDS FORMULA PO) Take 1 tablet by mouth 2 (two) times daily.   No facility-administered encounter medications on file as of 11/28/2021.    Allergies (verified) Patient has no known allergies.   History: Past Medical History:  Diagnosis Date   [place] aortic [place]) [place]    Basal cell carcinoma    02/09/2020: patient denies   Diverticulitis    3x through 18 months around 2012   Hepatitis    02/29/2020: patient denies hx   History of alcohol abuse  sober since age 78   History of vertigo    december 2007    Hypercholesterolemia    Inguinal hernia 2005   left side per patient   Seasonal allergies    no rx   Past Surgical History:  Procedure Laterality Date   ABDOMINAL AORTIC ENDOVASCULAR STENT GRAFT  04/13/2017   w/EMBOLIZATION LEFT HYPOGASTRIC USING INTERLOCK-35 DETACHABLE COIL/notes 04/13/2017   ABDOMINAL AORTIC ENDOVASCULAR STENT GRAFT N/A 04/13/2017   Procedure: ABDOMINAL AORTIC ENDOVASCULAR STENT GRAFT;  Surgeon: Rosetta Posner, MD;   Location: [facility];  Service: Vascular;  Laterality: N/A;   AORTOGRAM N/A 03/07/2020   Procedure: AORTOGRAM with insertion of  Aortic Cuff;  Surgeon: Rosetta Posner, MD;  Location: [place] [facility];  Service: Vascular;  Laterality: N/A;   Aortogram and placement of proximal aortic cuff  03/07/2020   BACK SURGERY     BASAL CELL CARCINOMA EXCISION  X~ 9   "chest, back, left leg" (04/13/2017)   CARDIOVASCULAR STRESS TEST  03/16/2012   CATARACT EXTRACTION W/ INTRAOCULAR LENS  IMPLANT, BILATERAL Bilateral    L pupil larger after lens implant   COLONOSCOPY     EMBOLIZATION N/A 04/13/2017   Procedure: EMBOLIZATION LEFT HYPOGASTRIC USING INTERLOCK-35 DETACHABLE COIL;  Surgeon: Rosetta Posner, MD;  Location: [facility];  Service: Vascular;  Laterality: N/A;   HERNIA REPAIR Right 08/2020   INGUINAL HERNIA REPAIR Right 2005   MOHS SURGERY  09/2014; ~ 2014   BCC; nose and left ear   POSTERIOR FUSION LUMBAR SPINE  10/15/1982   "the 2 lowest discs fused"   ULTRASOUND GUIDANCE FOR VASCULAR ACCESS Right 03/07/2020   Procedure: ULTRASOUND GUIDANCE FOR VASCULAR ACCESS, right femoral artery;  Surgeon: Rosetta Posner, MD;  Location: [facility];  Service: Vascular;  Laterality: Right;   Family History  Problem Relation Age of Onset   AAA (abdominal aortic aneurysm) Father        sounds like died of DIC- otherwise healthy   Peripheral vascular disease Father        Aneurysm- Aorta   AAA (abdominal aortic aneurysm) Brother        states multiple medicle issues but died of pulmonary fibrosis- was told smoking related   Heart disease Brother 8       Before age 44   Heart attack Brother        x2   Alcohol abuse Brother        sober 75 years when he died   Prostate cancer Brother    Cancer Mother        Stomach   Social History   Socioeconomic History   Marital status: Married    Spouse name: Not on file   Number of children: 2   Years of education: Not on file   Highest education level: Not on file  Occupational History    Occupation: Retired    Comment: Programme researcher, 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 2 kids- 1 local. No grandkids and wont have them. No pets.  Retired in 2008 from [employer] to [employer]: golfing 2-3 days a week, down to place in [place]- walk at [employer], walks 2.5 miles daily, service work      Lives in [place] Strain: [place]  (11/28/2021)   Overall Financial Resource Strain (CARDIA)    Difficulty of Paying Living Expenses: Not hard at all  Food Insecurity: No Food Insecurity (11/28/2021)   Hunger Vital Sign    Worried About Running Out of Food in the Last Year: Never true    [place] in the Last Year: Never true  Transportation Needs: No Transportation Needs (11/28/2021)   PRAPARE - Hydrologist (Medical): No    Lack of Transportation (Non-Medical): No  Physical Activity: Sufficiently Active (11/28/2021)   Exercise Vital Sign    Days of Exercise per Week: 7 days    Minutes of Exercise per Session: 90 min  Stress: No Stress Concern Present (11/28/2021)   [place]    Feeling of Stress : Not at all  Social Connections: [place] (11/28/2021)   Social Connection and Isolation Panel [NHANES]    Frequency of Communication with Friends and Family: More than three times a week    Frequency of Social Gatherings with Friends and Family: More than three times a week    Attends Religious Services: More than  4 times per year    Active Member of [employer] or Organizations: Yes    Attends Archivist Meetings: 1 to 4 times per year    Marital Status: Married    Tobacco Counseling Counseling given: Not Answered   Clinical Intake:  Pre-visit preparation completed: Yes  Pain : No/denies pain     Diabetes: No  How often do you need to have someone help you when you read instructions, pamphlets, or other written materials from your doctor or pharmacy?: 1 - Never  Diabetic?no  Interpreter Needed?: No  Information entered by :: Charlott Rakes, LPN   Activities of Daily Living    11/28/2021   11:34 AM  In your present state of health, do you have any difficulty performing the following activities:  Hearing? 0  Vision? 0  Difficulty concentrating or making decisions? 0  Walking or climbing stairs? 0  Dressing or bathing? 0  Doing errands, shopping? 0  Preparing Food and eating ? N  Using the Toilet? N  In the past six months, have you accidently leaked urine? N  Do you have problems with loss of bowel control? N  Managing your Medications? N  Managing your Finances? N  Housekeeping or managing your Housekeeping? N    Patient Care Team: Marin Olp, MD as PCP - General (Family Medicine) Laurence Spates, MD (Inactive) as Consulting Physician (Gastroenterology) Dermatology, [facility] as Consulting Physician  Indicate any recent Medical Services you may have received from other than [facility] providers in the past year (date may be approximate).     Assessment:   This is a routine wellness examination for [place].  Hearing/Vision screen Hearing Screening - Comments:: Pt denies any hearing issues  Vision Screening - Comments:: Pt follows up with Dr Amalia Hailey for annul eye exams   Dietary issues and exercise activities discussed: Current Exercise Habits: Home exercise routine, Type of exercise: Other - see comments;walking (golf), Time (Minutes): >  60, Frequency  (Times/Week): 7, Weekly Exercise (Minutes/Week): 0   Goals Addressed             This Visit's Progress    Patient Stated       Maintain health        Depression Screen    11/28/2021   11:31 AM 11/19/2020   11:07 AM 04/03/2020    1:14 PM 03/18/2019    1:30 PM 12/14/2017   11:47 AM 05/29/2016    8:09 AM 12/10/2015    8:14 AM  PHQ 2/9 Scores  PHQ - 2 Score 0 0 0 0 0 0 0    Fall Risk    11/28/2021   11:33 AM 11/19/2020   11:09 AM 04/03/2020    1:14 PM 03/18/2019    1:30 PM 01/25/2019    4:00 PM  [place] in the past year? 0 0 0 0 0  Comment     Emmi Telephone Survey: data to providers prior to load  Number falls in past yr: 0 0 0 0   Injury with Fall? 0 0 0 0   Risk for fall due to : No Fall Risks Impaired vision     Follow up Falls prevention discussed Falls prevention discussed  Falls evaluation completed;Education provided;Falls prevention discussed     FALL RISK PREVENTION PERTAINING TO THE HOME:  Any stairs in or around the home? No  If so, are there any without handrails? No  Home free of loose throw rugs in walkways, pet beds, electrical cords, etc? Yes  Adequate lighting in your home to reduce risk of falls? Yes   ASSISTIVE DEVICES UTILIZED TO PREVENT FALLS:  Life alert? Yes  Use of a cane, walker or w/c? No  Grab bars in the bathroom? Yes  Shower chair or bench in shower? Yes  Elevated toilet seat or a handicapped toilet? Yes   TIMED UP AND GO:  Was the test performed? No .   Cognitive Function:        11/28/2021   11:35 AM 11/19/2020   11:11 AM 03/18/2019    1:30 PM  6CIT Screen  What Year? 0 points 0 points 0 points  What month? 0 points 0 points 0 points  What time? 0 points 0 points 0 points  Count back from 20 0 points 0 points 0 points  Months in reverse 0 points 0 points 0 points  Repeat phrase 0 points 0 points 0 points  Total Score 0 points 0 points 0 points    Immunizations Immunization History  Administered Date(s)  Administered   BCG 12/14/2017   DT (Pediatric) 08/20/2017   Fluad Quad(high Dose 65+) 12/13/2018   Influenza, High Dose Seasonal PF 12/10/2015, 12/05/2016, 12/14/2017, 12/08/2019, 12/26/2020   Influenza,inj,Quad PF,6+ Mos 12/08/2019   Influenza-Unspecified 12/01/2012, 11/28/2016   Moderna Covid-19 Vaccine Bivalent Booster 103yr & up 01/30/2021   Moderna Sars-Covid-2 Vaccination 03/15/2019, 04/12/2019, 01/04/2020, 06/20/2020   PPD Test 12/14/2017   Pneumococcal Conjugate-13 05/29/2016   Pneumococcal Polysaccharide-23 08/07/2017   Rabies, IM 08/20/2017, 08/23/2017, 09/03/2017   Tdap 08/20/2017   Zoster Recombinat (Shingrix) 07/14/2019, 12/21/2019   Zoster, Live 07/14/2019, 12/21/2019    TDAP status: Up to date  Flu Vaccine status: Due, Education has been provided regarding the importance of this vaccine. Advised may receive this vaccine at local pharmacy or Health Dept. Aware to provide a copy of the vaccination record if obtained from local pharmacy or Health Dept. Verbalized acceptance and understanding.  Pneumococcal vaccine status: Up to date  Covid-19 vaccine status: Completed vaccines  Qualifies for Shingles Vaccine? Yes   Zostavax completed Yes   Shingrix Completed?: Yes  Screening Tests Health Maintenance  Topic Date Due   COVID-19 Vaccine (6 - Moderna series) 05/30/2021   INFLUENZA VACCINE  10/01/2021   TETANUS/TDAP  08/21/2027   Pneumonia Vaccine 71+ Years old  Completed   Zoster Vaccines- Shingrix  Completed   HPV VACCINES  Aged Out    Health Maintenance  Health Maintenance Due  Topic Date Due   COVID-19 Vaccine (6 - Moderna series) 05/30/2021   INFLUENZA VACCINE  10/01/2021    Colorectal cancer screening: No longer required.    Additional Screening:   Vision Screening: Recommended annual ophthalmology exams for early detection of glaucoma and other disorders of the eye. Is the patient up to date with their annual eye exam?  Yes  Who is the provider  or what is the name of the office in which the patient attends annual eye exams? Dr Amalia Hailey  If pt is not established with a provider, would they like to be referred to a provider to establish care? No .   Dental Screening: Recommended annual dental exams for proper oral hygiene  Community Resource Referral / Chronic Care Management: CRR required this visit?  No   CCM required this visit?  No      Plan:     I have personally reviewed and noted the following in the patient's chart:   Medical and social history Use of alcohol, tobacco or illicit drugs  Current medications and supplements including opioid prescriptions. Patient is not currently taking opioid prescriptions. Functional ability and status Nutritional status Physical activity Advanced directives List of other physicians Hospitalizations, surgeries, and ER visits in previous 12 months Vitals Screenings to include cognitive, depression, and falls Referrals and appointments  In addition, I have reviewed and discussed with patient certain preventive protocols, quality metrics, and best practice recommendations. A written personalized care plan for preventive services as well as general preventive health recommendations were provided to patient.     Willette Brace, LPN   06/10/7351   Nurse Notes: none

## 2021-11-28 NOTE — Patient Instructions (Signed)
Charles Stephenson , Thank you for taking time to come for your Medicare Wellness Visit. I appreciate your ongoing commitment to your health goals. Please review the following plan we discussed and let me know if I can assist you in the future.   These are the goals we discussed:  Goals      Patient Stated     Continue exercise routine      Patient Stated     Maintain health         This is a list of the screening recommended for you and due dates:  Health Maintenance  Topic Date Due   COVID-19 Vaccine (6 - Moderna series) 05/30/2021   Flu Shot  10/01/2021   Tetanus Vaccine  08/21/2027   Pneumonia Vaccine  Completed   Zoster (Shingles) Vaccine  Completed   HPV Vaccine  Aged Out    Advanced directives: Please bring a copy of your health care power of attorney and living will to the office at your convenience.  Conditions/risks identified: Maintain health   Next appointment: Follow up in one year for your annual wellness visit.   Preventive Care 20 Years and Older, Male  Preventive care refers to lifestyle choices and visits with your health care provider that can promote health and wellness. What does preventive care include? A yearly physical exam. This is also called an annual well check. Dental exams once or twice a year. Routine eye exams. Ask your health care provider how often you should have your eyes checked. Personal lifestyle choices, including: Daily care of your teeth and gums. Regular physical activity. Eating a healthy diet. Avoiding tobacco and drug use. Limiting alcohol use. Practicing safe sex. Taking low doses of aspirin every day. Taking vitamin and mineral supplements as recommended by your health care provider. What happens during an annual well check? The services and screenings done by your health care provider during your annual well check will depend on your age, overall health, lifestyle risk factors, and family history of disease. Counseling  Your  health care provider may ask you questions about your: Alcohol use. Tobacco use. Drug use. Emotional well-being. Home and relationship well-being. Sexual activity. Eating habits. History of falls. Memory and ability to understand (cognition). Work and work Statistician. Screening  You may have the following tests or measurements: Height, weight, and BMI. Blood pressure. Lipid and cholesterol levels. These may be checked every 5 years, or more frequently if you are over 96 years old. Skin check. Lung cancer screening. You may have this screening every year starting at age 46 if you have a 30-pack-year history of smoking and currently smoke or have quit within the past 15 years. Fecal occult blood test (FOBT) of the stool. You may have this test every year starting at age 38. Flexible sigmoidoscopy or colonoscopy. You may have a sigmoidoscopy every 5 years or a colonoscopy every 10 years starting at age 52. Prostate cancer screening. Recommendations will vary depending on your family history and other risks. Hepatitis C blood test. Hepatitis B blood test. Sexually transmitted disease (STD) testing. Diabetes screening. This is done by checking your blood sugar (glucose) after you have not eaten for a while (fasting). You may have this done every 1-3 years. Abdominal aortic aneurysm (AAA) screening. You may need this if you are a current or former smoker. Osteoporosis. You may be screened starting at age 17 if you are at high risk. Talk with your health care provider about your test results, treatment  options, and if necessary, the need for more tests. Vaccines  Your health care provider may recommend certain vaccines, such as: Influenza vaccine. This is recommended every year. Tetanus, diphtheria, and acellular pertussis (Tdap, Td) vaccine. You may need a Td booster every 10 years. Zoster vaccine. You may need this after age 43. Pneumococcal 13-valent conjugate (PCV13) vaccine. One dose  is recommended after age 63. Pneumococcal polysaccharide (PPSV23) vaccine. One dose is recommended after age 68. Talk to your health care provider about which screenings and vaccines you need and how often you need them. This information is not intended to replace advice given to you by your health care provider. Make sure you discuss any questions you have with your health care provider. Document Released: 03/16/2015 Document Revised: 11/07/2015 Document Reviewed: 12/19/2014 Elsevier Interactive Patient Education  2017 [employer] Prevention in the Home Falls can cause injuries. They can happen to people of all ages. There are many things you can do to make your home safe and to help prevent falls. What can I do on the outside of my home? Regularly fix the edges of walkways and driveways and fix any cracks. Remove anything that might make you trip as you walk through a door, such as a raised step or threshold. Trim any bushes or trees on the path to your home. Use bright outdoor lighting. Clear any walking paths of anything that might make someone trip, such as rocks or tools. Regularly check to see if handrails are loose or broken. Make sure that both sides of any steps have handrails. Any raised decks and porches should have guardrails on the edges. Have any leaves, snow, or ice cleared regularly. Use sand or salt on walking paths during winter. Clean up any spills in your garage right away. This includes oil or grease spills. What can I do in the bathroom? Use night lights. Install grab bars by the toilet and in the tub and shower. Do not use towel bars as grab bars. Use non-skid mats or decals in the tub or shower. If you need to sit down in the shower, use a plastic, non-slip stool. Keep the floor dry. Clean up any water that spills on the floor as soon as it happens. Remove soap buildup in the tub or shower regularly. Attach bath mats securely with double-sided non-slip rug  tape. Do not have throw rugs and other things on the floor that can make you trip. What can I do in the bedroom? Use night lights. Make sure that you have a light by your bed that is easy to reach. Do not use any sheets or blankets that are too big for your bed. They should not hang down onto the floor. Have a firm chair that has side arms. You can use this for support while you get dressed. Do not have throw rugs and other things on the floor that can make you trip. What can I do in the kitchen? Clean up any spills right away. Avoid walking on wet floors. Keep items that you use a lot in easy-to-reach places. If you need to reach something above you, use a strong step stool that has a grab bar. Keep electrical cords out of the way. Do not use floor polish or wax that makes floors slippery. If you must use wax, use non-skid floor wax. Do not have throw rugs and other things on the floor that can make you trip. What can I do with my stairs? Do not  leave any items on the stairs. Make sure that there are handrails on both sides of the stairs and use them. Fix handrails that are broken or loose. Make sure that handrails are as long as the stairways. Check any carpeting to make sure that it is firmly attached to the stairs. Fix any carpet that is loose or worn. Avoid having throw rugs at the top or bottom of the stairs. If you do have throw rugs, attach them to the floor with carpet tape. Make sure that you have a light switch at the top of the stairs and the bottom of the stairs. If you do not have them, ask someone to add them for you. What else can I do to help prevent falls? Wear shoes that: Do not have high heels. Have rubber bottoms. Are comfortable and fit you well. Are closed at the toe. Do not wear sandals. If you use a stepladder: Make sure that it is fully opened. Do not climb a closed stepladder. Make sure that both sides of the stepladder are locked into place. Ask someone to  hold it for you, if possible. Clearly mark and make sure that you can see: Any grab bars or handrails. First and last steps. Where the edge of each step is. Use tools that help you move around (mobility aids) if they are needed. These include: Canes. Walkers. Scooters. Crutches. Turn on the lights when you go into a dark area. Replace any light bulbs as soon as they burn out. Set up your furniture so you have a clear path. Avoid moving your furniture around. If any of your floors are uneven, fix them. If there are any pets around you, be aware of where they are. Review your medicines with your doctor. Some medicines can make you feel dizzy. This can increase your chance of falling. Ask your doctor what other things that you can do to help prevent falls. This information is not intended to replace advice given to you by your health care provider. Make sure you discuss any questions you have with your health care provider. Document Released: 12/14/2008 Document Revised: 07/26/2015 Document Reviewed: 03/24/2014 Elsevier Interactive Patient Education  2017 [employer].

## 2021-11-29 DIAGNOSIS — M542 Cervicalgia: Secondary | ICD-10-CM | POA: Diagnosis not present

## 2021-11-29 DIAGNOSIS — M6281 Muscle weakness (generalized): Secondary | ICD-10-CM | POA: Diagnosis not present

## 2021-12-03 DIAGNOSIS — M542 Cervicalgia: Secondary | ICD-10-CM | POA: Diagnosis not present

## 2021-12-03 DIAGNOSIS — M6281 Muscle weakness (generalized): Secondary | ICD-10-CM | POA: Diagnosis not present

## 2021-12-06 DIAGNOSIS — M542 Cervicalgia: Secondary | ICD-10-CM | POA: Diagnosis not present

## 2021-12-06 DIAGNOSIS — M6281 Muscle weakness (generalized): Secondary | ICD-10-CM | POA: Diagnosis not present

## 2021-12-18 ENCOUNTER — Ambulatory Visit: Payer: PPO | Admitting: Vascular Surgery

## 2021-12-26 ENCOUNTER — Telehealth: Payer: Self-pay | Admitting: *Deleted

## 2021-12-26 ENCOUNTER — Encounter: Payer: Self-pay | Admitting: *Deleted

## 2021-12-26 NOTE — Patient Outreach (Signed)
  Care Coordination   Initial Visit Note   12/26/2021 Name: Harless Molinari MRN: [id number] DOB: [birth year]  Ziaire Hagos is a 87 y.o. year old male who sees Yong Channel, Brayton Mars, MD for primary care. I spoke with  Benson Setting by phone today.  What matters to the patients health and wellness today?  No needs    Goals Addressed               This Visit's Progress     No needs (pt-stated)        Care Coordination Interventions: Reviewed medications with patient and discussed adherence with all prescribed medications with no needed refills Reviewed scheduled/upcoming provider appointments including pending upcoming appointments  Screening for signs and symptoms of depression related to chronic disease state  Assessed social determinant of health barriers          SDOH assessments and interventions completed:  Yes  SDOH Interventions Today    Flowsheet Row Most Recent Value  SDOH Interventions   Food Insecurity Interventions Intervention Not Indicated  Housing Interventions Intervention Not Indicated  Transportation Interventions Intervention Not Indicated  Utilities Interventions Intervention Not Indicated        Care Coordination Interventions Activated:  Yes  Care Coordination Interventions:  Yes, provided   Follow up plan: No further intervention required.   Encounter Outcome:  Pt. Visit Completed   Raina Mina, RN Care Management Coordinator [place] Office [phone]

## 2021-12-26 NOTE — Patient Instructions (Signed)
Visit Information  Thank you for taking time to visit with me today. Please don't hesitate to contact me if I can be of assistance to you.   Following are the goals we discussed today:   Goals Addressed               This Visit's Progress     No needs (pt-stated)        Care Coordination Interventions: Reviewed medications with patient and discussed adherence with all prescribed medications with no needed refills Reviewed scheduled/upcoming provider appointments including pending upcoming appointments  Screening for signs and symptoms of depression related to chronic disease state  Assessed social determinant of health barriers          Please call the care guide team at [phone] if you need to cancel or reschedule your appointment.   If you are experiencing a Mental Health or [place] or need someone to talk to, please call the Suicide and Crisis Lifeline: 988  Patient verbalizes understanding of instructions and care plan provided today and agrees to view in [place]. Active MyChart status and patient understanding of how to access instructions and care plan via MyChart confirmed with patient.     No further follow up required: No needs at this time    Raina Mina, RN Care Management Coordinator [place] Office [phone]

## 2022-01-01 ENCOUNTER — Ambulatory Visit (HOSPITAL_COMMUNITY)
Admission: RE | Admit: 2022-01-01 | Discharge: 2022-01-01 | Disposition: A | Discharge status: Home / Self Care | Payer: PPO | Source: Ambulatory Visit | Attending: Vascular Surgery | Admitting: Vascular Surgery

## 2022-01-01 DIAGNOSIS — Z95828 Presence of other vascular implants and grafts: Secondary | ICD-10-CM | POA: Insufficient documentation

## 2022-01-01 DIAGNOSIS — I714 Abdominal aortic aneurysm, without rupture, unspecified: Secondary | ICD-10-CM | POA: Diagnosis not present

## 2022-01-01 DIAGNOSIS — I9789 Other postprocedural complications and disorders of the circulatory system, not elsewhere classified: Secondary | ICD-10-CM | POA: Diagnosis not present

## 2022-01-01 LAB — POCT I-STAT CREATININE: Creatinine, Ser: 0.9 mg/dL (ref 0.61–1.24)

## 2022-01-01 MED ORDER — IOHEXOL 350 MG/ML SOLN
100.0000 mL | Freq: Once | INTRAVENOUS | Status: AC | PRN
  Administered 2022-01-01: 100 mL via INTRAVENOUS

## 2022-01-01 MED ORDER — SODIUM CHLORIDE (PF) 0.9 % IJ SOLN
INTRAMUSCULAR | Status: AC
  Filled 2022-01-01: qty 50

## 2022-01-08 ENCOUNTER — Encounter: Payer: Self-pay | Admitting: Vascular Surgery

## 2022-01-08 ENCOUNTER — Encounter: Payer: Self-pay | Admitting: Family Medicine

## 2022-01-08 ENCOUNTER — Ambulatory Visit: Payer: PPO | Admitting: Vascular Surgery

## 2022-01-08 VITALS — BP 123/76 | HR 55 | Temp 97.5°F | Ht 70.0 in | Wt 153.6 lb

## 2022-01-08 DIAGNOSIS — I7143 Infrarenal abdominal aortic aneurysm, without rupture: Secondary | ICD-10-CM | POA: Diagnosis not present

## 2022-01-08 NOTE — Progress Notes (Signed)
[facility] [place]  Patient name: Charles Stephenson MRN: [id number] DOB: March 24, 1934 Sex: male  REASON FOR VISIT: Follow-up stent graft repair abdominal aortic aneurysm  HPI: Levonte Stephenson is a 87 y.o. male here today for follow-up of stent graft repair abdominal aortic aneurysm.  He had initial stent graft repair in February 2019.  There was some question of increased size by CT scan and also possible type I proximal endoleak.  He therefore underwent possible aortic cuff placement in January 2022.  He looks quite good.  He is enjoying retirement in a retirement community.  He reports that he walks daily and plays golf several times per week.  His wife also enjoys good health.  Past Medical History:  Diagnosis Date   AAA (abdominal aortic aneurysm) ([place])    Basal cell carcinoma    02/09/2020: patient denies   Diverticulitis    3x through 18 months around 2012   Hepatitis    02/29/2020: patient denies hx   History of alcohol abuse    sober since age 32   History of vertigo    december 2007    Hypercholesterolemia    Inguinal hernia 2005   left side per patient   Seasonal allergies    no rx    Family History  Problem Relation Age of Onset   AAA (abdominal aortic aneurysm) Father        sounds like died of DIC- otherwise healthy   Peripheral vascular disease Father        Aneurysm- Aorta   AAA (abdominal aortic aneurysm) Brother        states multiple medicle issues but died of pulmonary fibrosis- was told smoking related   Heart disease Brother 85       Before age 82   Heart attack Brother        x2   Alcohol abuse Brother        sober 25 years when he died   Prostate cancer Brother    Cancer Mother        Stomach    SOCIAL HISTORY: Social History   Tobacco Use   Smoking status: Never   Smokeless tobacco: Never  Substance Use Topics   Alcohol use: No    Comment: 04/13/2017 "recovering alcoholic;  [phone]"    No Known Allergies  Current Outpatient Medications  Medication Sig Dispense Refill   atorvastatin (LIPITOR) 20 MG tablet TAKE ONE (1) TABLET BY MOUTH EACH DAY 90 tablet 3   Menthol, Topical Analgesic, (BIOFREEZE EX) APPLY FOUR TIMES DAILY AS NEEDED     METAMUCIL FIBER PO Take 5 capsules by mouth at bedtime.     Multiple Vitamins-Minerals (CENTRUM SILVER PO) Take 1 tablet by mouth daily.      Multiple Vitamins-Minerals (ICAPS AREDS 2 PO) Take by mouth.     Polyethyl Glycol-Propyl Glycol (SYSTANE) 0.4-0.3 % SOLN Apply to eye in the morning and at bedtime.     VITAMIN D PO Take 2 capsules by mouth daily.     No current facility-administered medications for this visit.    REVIEW OF SYSTEMS:  '[X]'$  denotes positive finding, '[ ]'$  denotes negative finding Cardiac  Comments:  Chest pain or chest pressure:    Shortness of breath upon exertion:    Short of breath when lying flat:    Irregular heart rhythm:        Vascular    Pain in calf, thigh, or hip brought on by  ambulation:    Pain in feet at night that wakes you up from your sleep:     Blood clot in your veins:    Leg swelling:           PHYSICAL EXAM: Vitals:   01/08/22 1057  BP: 123/76  Pulse: (!) 55  Temp: (!) 97.5 F (36.4 C)  SpO2: 98%  Weight: 153 lb 9.6 oz (69.7 kg)  Height: '5\' 10"'$  (1.778 m)    GENERAL: The patient is a well-nourished male, in no acute distress. The vital signs are documented above. CARDIOVASCULAR: 2+ radial pulses.  Abdominal exam reveals palpable aneurysm which is not expansile.  No tenderness PULMONARY: There is good air exchange  MUSCULOSKELETAL: There are no major deformities or cyanosis. NEUROLOGIC: No focal weakness or paresthesias are detected. SKIN: There are no ulcers or rashes noted. PSYCHIATRIC: The patient has a normal affect.  DATA:  CT scan from 01/01/2022 was reviewed with the patient.  We reviewed his actual images.  This shows no evidence of type I endoleak.  He has  a known type II endoleak.  There is no change in his excluded left internal iliac aneurysm and no change over his right small internal iliac artery aneurysm.  Maximal aneurysm sac size is predicted at 6.1 cm as compared to 5.8 cm in September 2022  MEDICAL ISSUES: Stable status post endograft repair abdominal aortic aneurysm.  He may have a slight increase in his maximal sac size since his CT scan 14 months earlier..  I do not feel that he has any risk for rupture of his aneurysm.  His internal iliac artery aneurysms are stable.  I would recommend CT scan in 1 year for continued follow-up.    Rosetta Posner, MD FACS [facility] [phone]  Note: Portions of this report may have been transcribed using voice recognition software.  Every effort has been made to ensure accuracy; however, inadvertent computerized transcription errors may still be present.

## 2022-02-10 DIAGNOSIS — H04123 Dry eye syndrome of bilateral lacrimal glands: Secondary | ICD-10-CM | POA: Diagnosis not present

## 2022-02-10 DIAGNOSIS — H40003 Preglaucoma, unspecified, bilateral: Secondary | ICD-10-CM | POA: Diagnosis not present

## 2022-02-17 ENCOUNTER — Telehealth: Payer: Self-pay | Admitting: *Deleted

## 2022-02-17 NOTE — Telephone Encounter (Signed)
This encounter was created in error - please disregard.

## 2022-02-17 NOTE — Addendum Note (Signed)
Addended by: Raina Mina D on: 02/17/2022 03:24 PM   Modules accepted: Orders, Level of Service

## 2022-02-26 DIAGNOSIS — Z20822 Contact with and (suspected) exposure to covid-19: Secondary | ICD-10-CM | POA: Diagnosis not present

## 2022-02-26 DIAGNOSIS — R0981 Nasal congestion: Secondary | ICD-10-CM | POA: Diagnosis not present

## 2022-02-26 DIAGNOSIS — J00 Acute nasopharyngitis [common cold]: Secondary | ICD-10-CM | POA: Diagnosis not present

## 2022-05-19 DIAGNOSIS — L821 Other seborrheic keratosis: Secondary | ICD-10-CM | POA: Diagnosis not present

## 2022-05-19 DIAGNOSIS — L814 Other melanin hyperpigmentation: Secondary | ICD-10-CM | POA: Diagnosis not present

## 2022-05-19 DIAGNOSIS — D225 Melanocytic nevi of trunk: Secondary | ICD-10-CM | POA: Diagnosis not present

## 2022-05-19 DIAGNOSIS — H61002 Unspecified perichondritis of left external ear: Secondary | ICD-10-CM | POA: Diagnosis not present

## 2022-05-19 DIAGNOSIS — D692 Other nonthrombocytopenic purpura: Secondary | ICD-10-CM | POA: Diagnosis not present

## 2022-05-19 DIAGNOSIS — D1801 Hemangioma of skin and subcutaneous tissue: Secondary | ICD-10-CM | POA: Diagnosis not present

## 2022-05-19 DIAGNOSIS — L570 Actinic keratosis: Secondary | ICD-10-CM | POA: Diagnosis not present

## 2022-05-19 DIAGNOSIS — Z85828 Personal history of other malignant neoplasm of skin: Secondary | ICD-10-CM | POA: Diagnosis not present

## 2022-08-18 DIAGNOSIS — L308 Other specified dermatitis: Secondary | ICD-10-CM | POA: Diagnosis not present

## 2022-08-18 DIAGNOSIS — L814 Other melanin hyperpigmentation: Secondary | ICD-10-CM | POA: Diagnosis not present

## 2022-08-18 DIAGNOSIS — L820 Inflamed seborrheic keratosis: Secondary | ICD-10-CM | POA: Diagnosis not present

## 2022-08-18 DIAGNOSIS — H61001 Unspecified perichondritis of right external ear: Secondary | ICD-10-CM | POA: Diagnosis not present

## 2022-08-18 DIAGNOSIS — Z85828 Personal history of other malignant neoplasm of skin: Secondary | ICD-10-CM | POA: Diagnosis not present

## 2022-08-18 DIAGNOSIS — L570 Actinic keratosis: Secondary | ICD-10-CM | POA: Diagnosis not present

## 2022-09-03 ENCOUNTER — Other Ambulatory Visit: Payer: Self-pay | Admitting: Family Medicine

## 2022-09-03 DIAGNOSIS — Z0000 Encounter for general adult medical examination without abnormal findings: Secondary | ICD-10-CM

## 2022-09-03 DIAGNOSIS — E785 Hyperlipidemia, unspecified: Secondary | ICD-10-CM

## 2022-09-08 ENCOUNTER — Encounter: Payer: PPO | Admitting: Family Medicine

## 2022-09-15 ENCOUNTER — Encounter: Payer: Self-pay | Admitting: Family Medicine

## 2022-09-15 ENCOUNTER — Ambulatory Visit (INDEPENDENT_AMBULATORY_CARE_PROVIDER_SITE_OTHER): Payer: PPO | Admitting: Family Medicine

## 2022-09-15 DIAGNOSIS — Z0000 Encounter for general adult medical examination without abnormal findings: Secondary | ICD-10-CM | POA: Diagnosis not present

## 2022-09-15 DIAGNOSIS — I7143 Infrarenal abdominal aortic aneurysm, without rupture: Secondary | ICD-10-CM

## 2022-09-15 DIAGNOSIS — E785 Hyperlipidemia, unspecified: Secondary | ICD-10-CM | POA: Diagnosis not present

## 2022-09-15 LAB — LIPID PANEL
Cholesterol: 137 mg/dL (ref 0–200)
HDL: 63.2 mg/dL (ref >39.00)
LDL Cholesterol: 62 mg/dL (ref 0–99)
NonHDL: 73.85
Total CHOL/HDL Ratio: 2
Triglycerides: 61 mg/dL (ref 0.0–149.0)
VLDL: 12.2 mg/dL (ref 0.0–40.0)

## 2022-09-15 LAB — COMPREHENSIVE METABOLIC PANEL
ALT: 25 U/L (ref 0–53)
AST: 28 U/L (ref 0–37)
Albumin: 4.3 g/dL (ref 3.5–5.2)
Alkaline Phosphatase: 60 U/L (ref 39–117)
BUN: 15 mg/dL (ref 6–23)
CO2: 28 mEq/L (ref 19–32)
Calcium: 9.9 mg/dL (ref 8.4–10.5)
Chloride: 103 mEq/L (ref 96–112)
Creatinine, Ser: 0.87 mg/dL (ref 0.40–1.50)
GFR: 78.4 mL/min (ref >60.00)
Glucose, Bld: 90 mg/dL (ref 70–99)
Potassium: 4.3 mEq/L (ref 3.5–5.1)
Sodium: 138 mEq/L (ref 135–145)
Total Bilirubin: 1 mg/dL (ref 0.2–1.2)
Total Protein: 7.1 g/dL (ref 6.0–8.3)

## 2022-09-15 LAB — CBC WITH DIFFERENTIAL/PLATELET
Basophils Absolute: 0 10*3/uL (ref 0.0–0.1)
Basophils Relative: 0.6 % (ref 0.0–3.0)
Eosinophils Absolute: 0.1 10*3/uL (ref 0.0–0.7)
Eosinophils Relative: 1.7 % (ref 0.0–5.0)
HCT: 40.8 % (ref 39.0–52.0)
Hemoglobin: 13.5 g/dL (ref 13.0–17.0)
Lymphocytes Relative: 18.9 % (ref 12.0–46.0)
Lymphs Abs: 1.1 10*3/uL (ref 0.7–4.0)
MCHC: 33.1 g/dL (ref 30.0–36.0)
MCV: 91.7 fl (ref 78.0–100.0)
Monocytes Absolute: 0.7 10*3/uL (ref 0.1–1.0)
Monocytes Relative: 11.9 % (ref 3.0–12.0)
Neutro Abs: 3.7 10*3/uL (ref 1.4–7.7)
Neutrophils Relative %: 66.9 % (ref 43.0–77.0)
Platelets: 158 10*3/uL (ref 150.0–400.0)
RBC: 4.45 Mil/uL (ref 4.22–5.81)
RDW: 14.3 % (ref 11.5–15.5)
WBC: 5.6 10*3/uL (ref 4.0–10.5)

## 2022-09-15 MED ORDER — ATORVASTATIN CALCIUM 20 MG PO TABS: ORAL_TABLET | ORAL | 3 refills | Status: DC

## 2022-09-15 NOTE — Progress Notes (Signed)
Phone: [phone]   Subjective:  Patient presents today for their annual physical. Chief complaint-noted.   See problem oriented charting- ROS- full  review of systems was completed and negative  Per full ROS sheet completed by patient  The following were reviewed and entered/updated in epic: Past Medical History:  Diagnosis Date   AAA (abdominal aortic aneurysm) ([facility])    Allergy    Arthritis    Basal cell carcinoma    02/09/2020: patient denies   Diverticulitis    3x through 18 months around 2012   Hepatitis    02/29/2020: patient denies hx   History of alcohol abuse    sober since age 16   History of vertigo    december 2007    Hypercholesterolemia    Inguinal hernia 2005   left side per patient   Seasonal allergies    no rx   Patient Active Problem List   Diagnosis Date Noted   History of basal cell carcinoma of skin 12/10/2015    Priority: Medium    Hyperlipidemia 03/12/2012    Priority: Medium    AAA (abdominal aortic aneurysm) ([facility]) 03/12/2012    Priority: Medium    Macular degeneration 03/18/2019    Priority: Low   Vertigo 05/29/2016    Priority: Low   Colon cancer screening 04/21/2016    Priority: Low   Seasonal allergies     Priority: Low   Chest pain 03/12/2012    Priority: Low   Type II endoleak of aortic graft 03/07/2020   S/P AAA repair 02/22/2018   Lumbosacral radiculitis 09/04/2017   Low back pain 04/21/2017   Past Surgical History:  Procedure Laterality Date   ABDOMINAL AORTIC ENDOVASCULAR STENT GRAFT  04/13/2017   w/EMBOLIZATION LEFT HYPOGASTRIC USING INTERLOCK-35 DETACHABLE COIL/notes 04/13/2017   ABDOMINAL AORTIC ENDOVASCULAR STENT GRAFT N/A 04/13/2017   Procedure: ABDOMINAL AORTIC ENDOVASCULAR STENT GRAFT;  Surgeon: Larina Earthly, MD;  Location: [facility] OR;  Service: Vascular;  Laterality: N/A;   AORTOGRAM N/A 03/07/2020   Procedure: AORTOGRAM with insertion of  Aortic Cuff;  Surgeon: Larina Earthly, MD;  Location: [facility] OR;  Service:  Vascular;  Laterality: N/A;   Aortogram and placement of proximal aortic cuff  03/07/2020   BACK SURGERY     BASAL CELL CARCINOMA EXCISION  X~ 9   "chest, back, left leg" (04/13/2017)   CARDIOVASCULAR STRESS TEST  03/16/2012   CATARACT EXTRACTION W/ INTRAOCULAR LENS  IMPLANT, BILATERAL Bilateral    L pupil larger after lens implant   COLONOSCOPY     EMBOLIZATION N/A 04/13/2017   Procedure: EMBOLIZATION LEFT HYPOGASTRIC USING INTERLOCK-35 DETACHABLE COIL;  Surgeon: Larina Earthly, MD;  Location: [facility] OR;  Service: Vascular;  Laterality: N/A;   HERNIA REPAIR Right 08/2020   INGUINAL HERNIA REPAIR Right 2005   MOHS SURGERY  09/2014; ~ 2014   BCC; nose and left ear   POSTERIOR FUSION LUMBAR SPINE  10/15/1982   "the 2 lowest discs fused"   ULTRASOUND GUIDANCE FOR VASCULAR ACCESS Right 03/07/2020   Procedure: ULTRASOUND GUIDANCE FOR VASCULAR ACCESS, right femoral artery;  Surgeon: Larina Earthly, MD;  Location: [facility] OR;  Service: Vascular;  Laterality: Right;    Family History  Problem Relation Age of Onset   AAA (abdominal aortic aneurysm) Father        sounds like died of DIC- otherwise healthy   Peripheral vascular disease Father        Aneurysm- Aorta   AAA (abdominal aortic aneurysm) Brother  states multiple medicle issues but died of pulmonary fibrosis- was told smoking related   Heart disease Brother 44       Before age 47   Heart attack Brother        x2   Alcohol abuse Brother        sober 25 years when he died   Prostate cancer Brother    Cancer Mother        Stomach    Medications- reviewed and updated Current Outpatient Medications  Medication Sig Dispense Refill   atorvastatin (LIPITOR) 20 MG tablet TAKE 1 TABLET BY MOUTH EVERY DAY 90 tablet 3   Multiple Vitamins-Minerals (CENTRUM SILVER PO) Take 1 tablet by mouth daily.      Multiple Vitamins-Minerals (ICAPS AREDS 2 PO) Take by mouth.     Polyethyl Glycol-Propyl Glycol (SYSTANE) 0.4-0.3 % SOLN Apply to eye in the  morning and at bedtime.     VITAMIN D PO Take 2 capsules by mouth daily.     Menthol, Topical Analgesic, (BIOFREEZE EX) APPLY FOUR TIMES DAILY AS NEEDED     METAMUCIL FIBER PO Take 5 capsules by mouth at bedtime.     No current facility-administered medications for this visit.    Allergies-reviewed and updated No Known Allergies  Social History   Social History Narrative   Married to wife Antiono Ettinger (patient of mine). 2 kids- 1 local. No grandkids and wont have them. No pets.       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 Oropharynx normal Neck: no thyromegaly CV: RRR no murmurs rubs or gallops Lungs: CTAB no crackles, wheeze, rhonchi Abdomen: soft/nontender/nondistended/normal bowel sounds. No rebound or guarding.  Ext: no edema Skin: warm, dry Neuro: grossly normal, moves all extremities, PERRLA   Assessment and Plan  88 y.o. male presenting for annual physical.  Health Maintenance counseling: 1. Anticipatory guidance: Patient counseled regarding regular dental exams -q4 months, eye exams - at leastyearly with macular degeneration on AREDs followed by Dr. Logan Bores- sees end of this month,  avoiding smoking and second hand smoke , limiting alcohol to 2 beverages per day -sober 46 years- 47 in november, no illicit drugs .   2. Risk factor reduction:  Advised patient of need for regular exercise and diet rich and fruits and vegetables to reduce risk of heart attack and stroke.  Exercise- still excellent- 3 miles most days (other than Sunday) and golf 2x a week when weather  reasonable, also in fitness center 30 days 3 days a week. Still gardening as well Diet/weight management-weight stable from last year- healthy diet in general .  Wt Readings from Last 3 Encounters:  09/15/22 156 lb 6.4 oz (70.9 kg)  01/08/22 153 lb 9.6 oz (69.7 kg)  09/05/21 155 lb (70.3 kg)  3. Immunizations/screenings/ancillary studies- consider fall COVID shot when new one released  Immunization History  Administered Date(s) Administered   BCG 12/14/2017   Covid-19, Mrna,Vaccine(Spikevax)66yrs  and older 01/08/2022   DT (Pediatric) 08/20/2017   Fluad Quad(high Dose 65+) 12/13/2018   Influenza, High Dose Seasonal PF 12/10/2015, 12/05/2016, 12/14/2017, 12/08/2019, 12/26/2020, 12/04/2021   Influenza,inj,Quad PF,6+ Mos 12/08/2019   Influenza-Unspecified 12/01/2012, 11/28/2016   Moderna Covid-19 Vaccine Bivalent Booster 26yrs & up 01/30/2021   Moderna SARS-COV2 Booster Vaccination 01/08/2022   Moderna Sars-Covid-2 Vaccination 03/15/2019, 04/12/2019, 01/04/2020, 06/20/2020   PPD Test 12/14/2017   Pneumococcal Conjugate-13 05/29/2016   Pneumococcal Polysaccharide-23 08/07/2017   Rabies, IM 08/20/2017, 08/23/2017, 09/03/2017   Respiratory Syncytial Virus Vaccine,Recomb Aduvanted(Arexvy) 12/04/2021   Tdap 08/20/2017   Zoster Recombinant(Shingrix) 07/14/2019, 12/21/2019   Zoster, Live 07/14/2019, 12/21/2019  4. Prostate cancer screening-  past age based screening recommendations- stable urinary symptoms -nocturia once or twice a night but drinks a fair amount of water Lab Results  Component Value Date   PSA 1.0 05/10/2015   5. Colon cancer screening - no recall due to age after 03/25/2016 colonoscopy with [facility]- no blood in stool 6. Skin cancer screening- [facility] derm q6 months. advised regular sunscreen use. Denies worrisome, changing, or new skin lesions.  7. Smoking associated screening (lung cancer screening, AAA screen 65-75, UA)- never smoker 8. STD screening - only active with  wife  Status of chronic or acute concerns   #AAA status postrepair 03/07/2020 with Dr. Arbie Cookey. S: Continues to have type II endoleak but overall stable and plans for yearly follow-up with vascular surgery Dr. Arbie Cookey- last imaging 01/01/22  A/P: doing well- and has cloe vascular follow up- no changes needed at this time.   -blood pressure at home most recently 121/68- very reasonable and has had better readings in other offices BP Readings from Last 3 Encounters:  09/15/22 134/78  01/08/22 123/76  09/05/21 100/60    #hyperlipidemia S: Medication:Atorvastatin 20 mg daily Lab Results  Component Value Date   CHOL 145 09/05/2021   HDL 64.00 09/05/2021   LDLCALC 64 09/05/2021   TRIG 82.0 09/05/2021   CHOLHDL 2 09/05/2021  A/P: hopefully stable- update lipic panel today. Continue current meds for now    #Rare reflux-over-the-counter medications as needed-minor about 6 weeks ago  #flat feet- Dr. Allena Katz was very helpful- inserts helpful  Recommended follow up: Return in about 1 year (around 09/15/2023) for physical or sooner if needed.Schedule b4 you leave.  Lab/Order associations: fasting   ICD-10-CM   1. Preventative health care  Z00.00     2. Hyperlipidemia, unspecified hyperlipidemia type  E78.5     3. Infrarenal abdominal aortic aneurysm (AAA) without rupture (HCC) Chronic I71.43       No orders of the defined types were placed in this encounter.   Return precautions advised.  Tana Conch, MD

## 2022-09-15 NOTE — Patient Instructions (Addendum)
Let us know if you get any COVID vaccines this fall.  Please stop by lab before you go If you have mychart- we will send your results within 3 business days of [place] receiving them.  If you do not have mychart- we will call you about results within 5 business days of [place] receiving them.  *please also note that you will see labs on mychart as soon as they post. I will later go in and write notes on them- will say "notes from Dr. Durene Cal"   Recommended follow up: Return in about 1 year (around 09/15/2023) for physical or sooner if needed.Schedule b4 you leave.

## 2022-09-26 DIAGNOSIS — H40003 Preglaucoma, unspecified, bilateral: Secondary | ICD-10-CM | POA: Diagnosis not present

## 2022-09-26 DIAGNOSIS — H5202 Hypermetropia, left eye: Secondary | ICD-10-CM | POA: Diagnosis not present

## 2022-09-26 DIAGNOSIS — H524 Presbyopia: Secondary | ICD-10-CM | POA: Diagnosis not present

## 2022-09-26 DIAGNOSIS — H5211 Myopia, right eye: Secondary | ICD-10-CM | POA: Diagnosis not present

## 2022-09-26 DIAGNOSIS — H52203 Unspecified astigmatism, bilateral: Secondary | ICD-10-CM | POA: Diagnosis not present

## 2022-09-26 DIAGNOSIS — H353132 Nonexudative age-related macular degeneration, bilateral, intermediate dry stage: Secondary | ICD-10-CM | POA: Diagnosis not present

## 2022-09-26 DIAGNOSIS — Z961 Presence of intraocular lens: Secondary | ICD-10-CM | POA: Diagnosis not present

## 2022-09-26 DIAGNOSIS — H04123 Dry eye syndrome of bilateral lacrimal glands: Secondary | ICD-10-CM | POA: Diagnosis not present

## 2022-09-26 DIAGNOSIS — H43813 Vitreous degeneration, bilateral: Secondary | ICD-10-CM | POA: Diagnosis not present

## 2022-10-22 DIAGNOSIS — M25551 Pain in right hip: Secondary | ICD-10-CM | POA: Diagnosis not present

## 2022-11-10 ENCOUNTER — Ambulatory Visit (INDEPENDENT_AMBULATORY_CARE_PROVIDER_SITE_OTHER): Payer: PPO

## 2022-11-10 VITALS — Wt 156.0 lb

## 2022-11-10 DIAGNOSIS — Z0000 Encounter for general adult medical examination without abnormal findings: Secondary | ICD-10-CM

## 2022-11-10 NOTE — Progress Notes (Signed)
Subjective:   Charles Stephenson is a 88 y.o. male who presents for Medicare Annual/Subsequent preventive examination.  Visit Complete: Virtual  I connected with  Charles Stephenson on 11/10/22 by a audio enabled telemedicine application and verified that I am speaking with the correct person using two identifiers.  Patient Location: Home  Provider Location: Office/Clinic  I discussed the limitations of evaluation and management by telemedicine. The patient expressed understanding and agreed to proceed.  Patient Medicare AWV questionnaire was completed by the patient on 11/06/22; I have confirmed that all information answered by patient is correct and no changes since this date.  Vital Signs: Unable to obtain new vitals due to this being a telehealth visit.   Review of Systems     Cardiac Risk Factors include: advanced age (>75men, >67 women);dyslipidemia;male gender     Objective:    Today's Vitals   11/10/22 1103  Weight: 156 lb (70.8 kg)   Body mass index is 22.38 kg/m.     11/10/2022   11:06 AM 11/28/2021   11:33 AM 11/19/2020   11:08 AM 03/07/2020    4:47 PM 03/07/2020    9:12 AM 02/29/2020   11:27 AM 03/18/2019    1:29 PM  Advanced Directives  Does Patient Have a Medical Advance Directive? Yes Yes Yes Yes  Yes Yes  Type of Estate agent of [place];Living will Healthcare Power of [place];Living will Healthcare Power of [employer] of [place];Living will  Healthcare Power of [place];Living will Living will;Healthcare Power of Attorney  Does patient want to make changes to medical advance directive?    No - Patient declined No - Patient declined No - Patient declined No - Patient declined  Copy of Healthcare Power of Attorney in Chart? No - copy requested No - copy requested No - copy requested Yes - validated most recent copy scanned in chart (See row information)  Yes - validated most recent copy scanned in chart (See row  information) No - copy requested    Current Medications (verified) Outpatient Encounter Medications as of 11/10/2022  Medication Sig   atorvastatin (LIPITOR) 20 MG tablet TAKE 1 TABLET BY MOUTH EVERY DAY   Cholecalciferol (VITAMIN D3) 50 MCG (2000 UT) capsule Take by mouth.   Multiple Vitamins-Minerals (CENTRUM SILVER PO) Take 1 tablet by mouth daily.    Multiple Vitamins-Minerals (ICAPS AREDS 2 PO) Take by mouth.   Polyethyl Glycol-Propyl Glycol (SYSTANE) 0.4-0.3 % SOLN Apply to eye in the morning and at bedtime.   [DISCONTINUED] VITAMIN D PO Take 2 capsules by mouth daily.   No facility-administered encounter medications on file as of 11/10/2022.    Allergies (verified) Patient has no known allergies.   History: Past Medical History:  Diagnosis Date   AAA (abdominal aortic aneurysm) (HCC)    Allergy    Arthritis    Basal cell carcinoma    02/09/2020: patient denies   Diverticulitis    3x through 18 months around 2012   Hepatitis    02/29/2020: patient denies hx   History of alcohol abuse    sober since age 58   History of vertigo    december 2007    Hypercholesterolemia    Inguinal hernia 2005   left side per patient   Seasonal allergies    no rx   Past Surgical History:  Procedure Laterality Date   ABDOMINAL AORTIC ENDOVASCULAR STENT GRAFT  04/13/2017   w/EMBOLIZATION LEFT HYPOGASTRIC USING INTERLOCK-35 DETACHABLE COIL/notes 04/13/2017  ABDOMINAL AORTIC ENDOVASCULAR STENT GRAFT N/A 04/13/2017   Procedure: ABDOMINAL AORTIC ENDOVASCULAR STENT GRAFT;  Surgeon: Larina Earthly, MD;  Location: [facility] OR;  Service: Vascular;  Laterality: N/A;   AORTOGRAM N/A 03/07/2020   Procedure: AORTOGRAM with insertion of  Aortic Cuff;  Surgeon: Larina Earthly, MD;  Location: [facility] OR;  Service: Vascular;  Laterality: N/A;   Aortogram and placement of proximal aortic cuff  03/07/2020   BACK SURGERY     BASAL CELL CARCINOMA EXCISION  X~ 9   "chest, back, left leg" (04/13/2017)   CARDIOVASCULAR  STRESS TEST  03/16/2012   CATARACT EXTRACTION W/ INTRAOCULAR LENS  IMPLANT, BILATERAL Bilateral    L pupil larger after lens implant   COLONOSCOPY     EMBOLIZATION N/A 04/13/2017   Procedure: EMBOLIZATION LEFT HYPOGASTRIC USING INTERLOCK-35 DETACHABLE COIL;  Surgeon: Larina Earthly, MD;  Location: [facility] OR;  Service: Vascular;  Laterality: N/A;   HERNIA REPAIR Right 08/2020   INGUINAL HERNIA REPAIR Right 2005   MOHS SURGERY  09/2014; ~ 2014   BCC; nose and left ear   POSTERIOR FUSION LUMBAR SPINE  10/15/1982   "the 2 lowest discs fused"   ULTRASOUND GUIDANCE FOR VASCULAR ACCESS Right 03/07/2020   Procedure: ULTRASOUND GUIDANCE FOR VASCULAR ACCESS, right femoral artery;  Surgeon: Larina Earthly, MD;  Location: [facility] OR;  Service: Vascular;  Laterality: Right;   Family History  Problem Relation Age of Onset   AAA (abdominal aortic aneurysm) Father        sounds like died of DIC- otherwise healthy   Peripheral vascular disease Father        Aneurysm- Aorta   AAA (abdominal aortic aneurysm) Brother        states multiple medicle issues but died of pulmonary fibrosis- was told smoking related   Heart disease Brother 56       Before age 25   Heart attack Brother        x2   Alcohol abuse Brother        sober 25 years when he died   Prostate cancer Brother    Cancer Mother        Stomach   Social History   Socioeconomic History   Marital status: Married    Spouse name: Not on file   Number of children: 2   Years of education: Not on file   Highest education level: Not on file  Occupational History   Occupation: Retired    Comment: Psychologist, 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 2 kids- 1 local. No grandkids and wont have them. No pets.       Retired in 2008 from [employer] to [employer]: golfing 2-3 days a week, down to place in [place]- walk at [employer], walks 2.5 miles daily, service work      Lives in [place]    Social Determinants of Health   Financial Resource Strain: Low Risk  (11/06/2022)   Overall Physicist, medical  Strain (CARDIA)    Difficulty of Paying Living Expenses: Not hard at all  Food Insecurity: No Food Insecurity (11/06/2022)   Hunger Vital Sign    Worried About Running Out of Food in the Last Year: Never true    Ran Out of Food in the Last Year: Never true  Transportation Needs: No Transportation Needs (11/06/2022)   PRAPARE - Administrator, Civil Service (Medical): No    Lack of Transportation (Non-Medical): No  Physical Activity: Sufficiently Active (11/06/2022)   Exercise Vital Sign    Days of Exercise per Week: 7 days    Minutes of Exercise per Session: 90 min  Stress: No Stress Concern Present (11/06/2022)   [employer] of Occupational Health - Occupational Stress Questionnaire    Feeling of Stress : Not at all  Social Connections: Socially Integrated (11/06/2022)   Social Connection and Isolation Panel [NHANES]    Frequency of Communication with Friends and Family: More than three times a week    Frequency of Social Gatherings with Friends and Family: More than three times a week    Attends Religious Services: More than 4 times per year    Active Member of [employer] or Organizations: Yes    Attends Engineer, structural: More than 4 times per year    Marital Status: Married    Tobacco Counseling Counseling given: Not Answered   Clinical Intake:  Pre-visit preparation completed: Yes  Pain : No/denies pain     BMI - recorded: 22.38 Nutritional Status: BMI of 19-24  Normal Nutritional Risks:  None Diabetes: No  How often do you need to have someone help you when you read instructions, pamphlets, or other written materials from your doctor or pharmacy?: 1 - Never  Interpreter Needed?: No  Information entered by :: Lanier Ensign, LPN   Activities of Daily Living    11/06/2022   10:52 AM 11/28/2021   11:34 AM  In your present state of health, do you have any difficulty performing the following activities:  Hearing? 0 0  Vision? 0 0  Difficulty concentrating or making decisions? 0 0  Walking or climbing stairs? 0 0  Dressing or bathing? 0 0  Doing errands, shopping? 0 0  Preparing Food and eating ? N N  Using the Toilet? N N  In the past six months, have you accidently leaked urine? N N  Do you have problems with loss of bowel control? N N  Managing your Medications? N N  Managing your Finances? N N  Housekeeping or managing your Housekeeping? N N    Patient Care Team: Shelva Majestic, MD as PCP - General (Family Medicine) Carman Ching, MD (Inactive) as Consulting Physician (Gastroenterology) Dermatology, [facility] as Consulting Physician  Indicate any recent Medical Services you may have received from other than [facility] providers in the past year (date may be approximate).     Assessment:   This is a routine wellness examination for Esiah.  Hearing/Vision screen Hearing Screening - Comments:: Pt denies any hearing issues  Vision Screening - Comments:: Pt follows up with Dr Clayburn Pert for annual eye exams    Goals Addressed             This Visit's Progress    Patient Stated       Stay active and healthy        Depression Screen    11/10/2022   11:06 AM 09/15/2022   10:51 AM 12/26/2021   11:32 AM 11/28/2021  11:31 AM 11/19/2020   11:07 AM 04/03/2020    1:14 PM 03/18/2019    1:30 PM  PHQ 2/9 Scores  PHQ - 2 Score 0 0 0 0 0 0 0  PHQ- 9 Score 0 0         Fall Risk    11/06/2022   10:52 AM 09/15/2022   10:51 AM 11/28/2021   11:33 AM 11/19/2020   11:09  AM 04/03/2020    1:14 PM  Fall Risk   Falls in the past year? 0 0 0 0 0  Number falls in past yr:  0 0 0 0  Injury with Fall?  0 0 0 0  Risk for fall due to : Impaired vision No Fall Risks No Fall Risks Impaired vision   Follow up Falls prevention discussed Falls evaluation completed Falls prevention discussed Falls prevention discussed     MEDICARE RISK AT HOME: Medicare Risk at Home Any stairs in or around the home?: No If so, are there any without handrails?: No Home free of loose throw rugs in walkways, pet beds, electrical cords, etc?: Yes Adequate lighting in your home to reduce risk of falls?: Yes Life alert?: Yes Use of a cane, walker or w/c?: No Grab bars in the bathroom?: Yes Shower chair or bench in shower?: No Elevated toilet seat or a handicapped toilet?: No  TIMED UP AND GO:  Was the test performed?  No    Cognitive Function:        11/10/2022   11:07 AM 11/28/2021   11:35 AM 11/19/2020   11:11 AM 03/18/2019    1:30 PM  6CIT Screen  What Year? 0 points 0 points 0 points 0 points  What month? 0 points 0 points 0 points 0 points  What time? 0 points 0 points 0 points 0 points  Count back from 20 0 points 0 points 0 points 0 points  Months in reverse 0 points 0 points 0 points 0 points  Repeat phrase 0 points 0 points 0 points 0 points  Total Score 0 points 0 points 0 points 0 points    Immunizations Immunization History  Administered Date(s) Administered   BCG 12/14/2017   Covid-19, Mrna,Vaccine(Spikevax)19yrs and older 01/08/2022   DT (Pediatric) 08/20/2017   Fluad Quad(high Dose 65+) 12/13/2018   Influenza Split 11/28/2016   Influenza, High Dose Seasonal PF 12/10/2015, 12/05/2016, 12/14/2017, 12/08/2019, 12/26/2020, 12/04/2021   Influenza,inj,Quad PF,6+ Mos 12/08/2019   Influenza-Unspecified 12/01/2012, 11/28/2016   Moderna Covid-19 Vaccine Bivalent Booster 17yrs & up 01/30/2021   Moderna SARS-COV2 Booster Vaccination 01/08/2022   Moderna Sars-Covid-2  Vaccination 03/15/2019, 04/12/2019, 01/04/2020, 06/20/2020, 01/30/2021   PPD Test 12/14/2017   Pneumococcal Conjugate-13 05/29/2016   Pneumococcal Polysaccharide-23 08/07/2017   Rabies, IM 08/20/2017, 08/23/2017, 09/03/2017   Respiratory Syncytial Virus Vaccine,Recomb Aduvanted(Arexvy) 12/04/2021   Tdap 08/20/2017   Zoster Recombinant(Shingrix) 07/14/2019, 12/21/2019   Zoster, Live 07/14/2019, 12/21/2019    TDAP status: Up to date  Flu Vaccine status: Due, Education has been provided regarding the importance of this vaccine. Advised may receive this vaccine at local pharmacy or Health Dept. Aware to provide a copy of the vaccination record if obtained from local pharmacy or Health Dept. Verbalized acceptance and understanding.  Pneumococcal vaccine status: Up to date  Covid-19 vaccine status: Information provided on how to obtain vaccines.   Qualifies for Shingles Vaccine? Yes   Zostavax completed Yes   Shingrix Completed?: Yes  Screening Tests Health Maintenance  Topic Date Due  INFLUENZA VACCINE  10/02/2022   COVID-19 Vaccine (7 - 2023-24 season) 11/02/2022   Medicare Annual Wellness (AWV)  11/10/2023   DTaP/Tdap/Td (3 - Td or Tdap) 08/21/2027   Pneumonia Vaccine 69+ Years old  Completed   Zoster Vaccines- Shingrix  Completed   HPV VACCINES  Aged Out    Health Maintenance  Health Maintenance Due  Topic Date Due   INFLUENZA VACCINE  10/02/2022   COVID-19 Vaccine (7 - 2023-24 season) 11/02/2022    Colorectal cancer screening: No longer required.     Additional Screening:   Vision Screening: Recommended annual ophthalmology exams for early detection of glaucoma and other disorders of the eye. Is the patient up to date with their annual eye exam?  Yes  Who is the provider or what is the name of the office in which the patient attends annual eye exams? Dr Logan Bores  If pt is not established with a provider, would they like to be referred to a provider to establish care?  No .   Dental Screening: Recommended annual dental exams for proper oral hygiene   Community Resource Referral / Chronic Care Management: CRR required this visit?  No   CCM required this visit?  No     Plan:     I have personally reviewed and noted the following in the patient's chart:   Medical and social history Use of alcohol, tobacco or illicit drugs  Current medications and supplements including opioid prescriptions. Patient is not currently taking opioid prescriptions. Functional ability and status Nutritional status Physical activity Advanced directives List of other physicians Hospitalizations, surgeries, and ER visits in previous 12 months Vitals Screenings to include cognitive, depression, and falls Referrals and appointments  In addition, I have reviewed and discussed with patient certain preventive protocols, quality metrics, and best practice recommendations. A written personalized care plan for preventive services as well as general preventive health recommendations were provided to patient.     Marzella Schlein, LPN   03/08/1094   After Visit Summary: (MyChart) Due to this being a telephonic visit, the after visit summary with patients personalized plan was offered to patient via MyChart   Nurse Notes: none

## 2022-11-10 NOTE — Patient Instructions (Signed)
Charles Stephenson , Thank you for taking time to come for your Medicare Wellness Visit. I appreciate your ongoing commitment to your health goals. Please review the following plan we discussed and let me know if I can assist you in the future.   Referrals/Orders/Follow-Ups/Clinician Recommendations: stay healthy and active   This is a list of the screening recommended for you and due dates:  Health Maintenance  Topic Date Due   Flu Shot  10/02/2022   COVID-19 Vaccine (7 - 2023-24 season) 11/02/2022   Medicare Annual Wellness Visit  11/29/2022   DTaP/Tdap/Td vaccine (3 - Td or Tdap) 08/21/2027   Pneumonia Vaccine  Completed   Zoster (Shingles) Vaccine  Completed   HPV Vaccine  Aged Out    Advanced directives: (Copy Requested) Please bring a copy of your health care power of attorney and living will to the office to be added to your chart at your convenience.  Next Medicare Annual Wellness Visit scheduled for next year: Yes

## 2022-12-19 ENCOUNTER — Other Ambulatory Visit: Payer: Self-pay

## 2022-12-19 DIAGNOSIS — I7143 Infrarenal abdominal aortic aneurysm, without rupture: Secondary | ICD-10-CM

## 2022-12-26 ENCOUNTER — Encounter: Payer: Self-pay | Admitting: Family Medicine

## 2023-01-05 ENCOUNTER — Ambulatory Visit
Admission: RE | Admit: 2023-01-05 | Discharge: 2023-01-05 | Disposition: A | Discharge status: Home / Self Care | Payer: PPO | Source: Ambulatory Visit | Attending: Vascular Surgery

## 2023-01-05 DIAGNOSIS — I7143 Infrarenal abdominal aortic aneurysm, without rupture: Secondary | ICD-10-CM

## 2023-01-05 DIAGNOSIS — Z9889 Other specified postprocedural states: Secondary | ICD-10-CM | POA: Diagnosis not present

## 2023-01-05 MED ORDER — IOPAMIDOL (ISOVUE-370) INJECTION 76%
100.0000 mL | Freq: Once | INTRAVENOUS | Status: AC | PRN
  Administered 2023-01-05: 100 mL via INTRAVENOUS

## 2023-01-06 DIAGNOSIS — D1801 Hemangioma of skin and subcutaneous tissue: Secondary | ICD-10-CM | POA: Diagnosis not present

## 2023-01-06 DIAGNOSIS — Z85828 Personal history of other malignant neoplasm of skin: Secondary | ICD-10-CM | POA: Diagnosis not present

## 2023-01-06 DIAGNOSIS — L570 Actinic keratosis: Secondary | ICD-10-CM | POA: Diagnosis not present

## 2023-01-06 DIAGNOSIS — H61001 Unspecified perichondritis of right external ear: Secondary | ICD-10-CM | POA: Diagnosis not present

## 2023-01-06 DIAGNOSIS — L2089 Other atopic dermatitis: Secondary | ICD-10-CM | POA: Diagnosis not present

## 2023-01-06 DIAGNOSIS — L821 Other seborrheic keratosis: Secondary | ICD-10-CM | POA: Diagnosis not present

## 2023-01-06 DIAGNOSIS — D225 Melanocytic nevi of trunk: Secondary | ICD-10-CM | POA: Diagnosis not present

## 2023-01-06 DIAGNOSIS — L814 Other melanin hyperpigmentation: Secondary | ICD-10-CM | POA: Diagnosis not present

## 2023-01-06 DIAGNOSIS — L853 Xerosis cutis: Secondary | ICD-10-CM | POA: Diagnosis not present

## 2023-01-16 ENCOUNTER — Ambulatory Visit: Payer: PPO | Admitting: Vascular Surgery

## 2023-01-19 NOTE — Progress Notes (Deleted)
[facility] [place]  Patient name: Charles Stephenson MRN: [id number] DOB: 05/31/1934 Sex: male  REASON FOR VISIT: Follow-up stent graft repair abdominal aortic aneurysm  HPI: Charles Stephenson is a 88 y.o. male here today for follow-up of stent graft repair abdominal aortic aneurysm.  He had initial stent graft repair in February 2019.  There was some question of increased size by CT scan and also possible type I proximal endoleak.  He therefore underwent possible aortic cuff placement in January 2022.  He looks quite good.  He is enjoying retirement in a retirement community.  He reports that he walks daily and plays golf several times per week.  His wife also enjoys good health.  Past Medical History:  Diagnosis Date   AAA (abdominal aortic aneurysm) (HCC)    Allergy    Arthritis    Basal cell carcinoma    02/09/2020: patient denies   Diverticulitis    3x through 18 months around 2012   Hepatitis    02/29/2020: patient denies hx   History of alcohol abuse    sober since age 17   History of vertigo    december 2007    Hypercholesterolemia    Inguinal hernia 2005   left side per patient   Seasonal allergies    no rx    Family History  Problem Relation Age of Onset   AAA (abdominal aortic aneurysm) Father        sounds like died of DIC- otherwise healthy   Peripheral vascular disease Father        Aneurysm- Aorta   AAA (abdominal aortic aneurysm) Brother        states multiple medicle issues but died of pulmonary fibrosis- was told smoking related   Heart disease Brother 53       Before age 37   Heart attack Brother        x2   Alcohol abuse Brother        sober 25 years when he died   Prostate cancer Brother    Cancer Mother        Stomach    SOCIAL HISTORY: Social History   Tobacco Use   Smoking status: Never   Smokeless tobacco: Never  Substance Use Topics   Alcohol use: No    Comment:  04/13/2017 "recovering alcoholic; 114/1979"    No Known Allergies  Current Outpatient Medications  Medication Sig Dispense Refill   atorvastatin (LIPITOR) 20 MG tablet TAKE 1 TABLET BY MOUTH EVERY DAY 90 tablet 3   Cholecalciferol (VITAMIN D3) 50 MCG (2000 UT) capsule Take by mouth.     Multiple Vitamins-Minerals (CENTRUM SILVER PO) Take 1 tablet by mouth daily.      Multiple Vitamins-Minerals (ICAPS AREDS 2 PO) Take by mouth.     Polyethyl Glycol-Propyl Glycol (SYSTANE) 0.4-0.3 % SOLN Apply to eye in the morning and at bedtime.     No current facility-administered medications for this visit.    REVIEW OF SYSTEMS:  [X]  denotes positive finding, [ ]  denotes negative finding Cardiac  Comments:  Chest pain or chest pressure:    Shortness of breath upon exertion:    Short of breath when lying flat:    Irregular heart rhythm:        Vascular    Pain in calf, thigh, or hip brought on by ambulation:    Pain in feet at night that wakes you up from your sleep:  Blood clot in your veins:    Leg swelling:           PHYSICAL EXAM: There were no vitals filed for this visit.   GENERAL: The patient is a well-nourished male, in no acute distress. The vital signs are documented above. CARDIOVASCULAR: 2+ radial pulses.  Abdominal exam reveals palpable aneurysm which is not expansile.  No tenderness PULMONARY: There is good air exchange  MUSCULOSKELETAL: There are no major deformities or cyanosis. NEUROLOGIC: No focal weakness or paresthesias are detected. SKIN: There are no ulcers or rashes noted. PSYCHIATRIC: The patient has a normal affect.  DATA:  CT scan from 01/01/2022 was reviewed with the patient.  We reviewed his actual images.  This shows no evidence of type I endoleak.  He has a known type II endoleak.  There is no change in his excluded left internal iliac aneurysm and no change over his right small internal iliac artery aneurysm.  Maximal aneurysm sac size is predicted at 6.1  cm as compared to 5.8 cm in September 2022  MEDICAL ISSUES: Stable status post endograft repair abdominal aortic aneurysm.  He may have a slight increase in his maximal sac size since his CT scan 14 months earlier..  I do not feel that he has any risk for rupture of his aneurysm.  His internal iliac artery aneurysms are stable.  I would recommend CT scan in 1 year for continued follow-up.    Larina Earthly, MD FACS [facility] [phone]  Note: Portions of this report may have been transcribed using voice recognition software.  Every effort has been made to ensure accuracy; however, inadvertent computerized transcription errors may still be present.

## 2023-01-20 ENCOUNTER — Ambulatory Visit: Payer: PPO | Admitting: Vascular Surgery

## 2023-02-03 NOTE — Progress Notes (Signed)
Patient ID: Charles Stephenson, male   DOB: 1935-01-04, 88 y.o.   MRN: [id number]  Reason for Consult: Follow-up   Referred by Shelva Majestic, MD  Subjective:     HPI  Charles Stephenson is a 88 y.o. male presenting for follow-up.  He underwent EVAR for abdominal aortic aneurysm in February 2019.  He then subsequently underwent aortic cuff placement for a type Ia endoleak in January 2022.  He denies any new or unusual abdominal or back pain.  He exercises 5 times a week and denies any other issues.  He is compliant with atorvastatin.  Past Medical History:  Diagnosis Date   AAA (abdominal aortic aneurysm) (HCC)    Allergy    Arthritis    Basal cell carcinoma    02/09/2020: patient denies   Diverticulitis    3x through 18 months around 2012   Hepatitis    02/29/2020: patient denies hx   History of alcohol abuse    sober since age 21   History of vertigo    december 2007    Hypercholesterolemia    Inguinal hernia 2005   left side per patient   Seasonal allergies    no rx   Family History  Problem Relation Age of Onset   AAA (abdominal aortic aneurysm) Father        sounds like died of DIC- otherwise healthy   Peripheral vascular disease Father        Aneurysm- Aorta   AAA (abdominal aortic aneurysm) Brother        states multiple medicle issues but died of pulmonary fibrosis- was told smoking related   Heart disease Brother 32       Before age 69   Heart attack Brother        x2   Alcohol abuse Brother        sober 25 years when he died   Prostate cancer Brother    Cancer Mother        Stomach   Past Surgical History:  Procedure Laterality Date   ABDOMINAL AORTIC ENDOVASCULAR STENT GRAFT  04/13/2017   w/EMBOLIZATION LEFT HYPOGASTRIC USING INTERLOCK-35 DETACHABLE COIL/notes 04/13/2017   ABDOMINAL AORTIC ENDOVASCULAR STENT GRAFT N/A 04/13/2017   Procedure: ABDOMINAL AORTIC ENDOVASCULAR STENT GRAFT;  Surgeon: Larina Earthly, MD;  Location: [facility] OR;  Service:  Vascular;  Laterality: N/A;   AORTOGRAM N/A 03/07/2020   Procedure: AORTOGRAM with insertion of  Aortic Cuff;  Surgeon: Larina Earthly, MD;  Location: [facility] OR;  Service: Vascular;  Laterality: N/A;   Aortogram and placement of proximal aortic cuff  03/07/2020   BACK SURGERY     BASAL CELL CARCINOMA EXCISION  X~ 9   "chest, back, left leg" (04/13/2017)   CARDIOVASCULAR STRESS TEST  03/16/2012   CATARACT EXTRACTION W/ INTRAOCULAR LENS  IMPLANT, BILATERAL Bilateral    L pupil larger after lens implant   COLONOSCOPY     EMBOLIZATION N/A 04/13/2017   Procedure: EMBOLIZATION LEFT HYPOGASTRIC USING INTERLOCK-35 DETACHABLE COIL;  Surgeon: Larina Earthly, MD;  Location: [facility] OR;  Service: Vascular;  Laterality: N/A;   HERNIA REPAIR Right 08/2020   INGUINAL HERNIA REPAIR Right 2005   MOHS SURGERY  09/2014; ~ 2014   BCC; nose and left ear   POSTERIOR FUSION LUMBAR SPINE  10/15/1982   "the 2 lowest discs fused"   ULTRASOUND GUIDANCE FOR VASCULAR ACCESS Right 03/07/2020   Procedure: ULTRASOUND GUIDANCE FOR VASCULAR ACCESS, right femoral artery;  Surgeon:  Larina Earthly, MD;  Location: [facility] OR;  Service: Vascular;  Laterality: Right;    Short Social History:  Social History   Tobacco Use   Smoking status: Never   Smokeless tobacco: Never  Substance Use Topics   Alcohol use: No    Comment: 04/13/2017 "recovering alcoholic; 114/1979"    No Known Allergies  Current Outpatient Medications  Medication Sig Dispense Refill   atorvastatin (LIPITOR) 20 MG tablet TAKE 1 TABLET BY MOUTH EVERY DAY 90 tablet 3   Cholecalciferol (VITAMIN D3) 50 MCG (2000 UT) capsule Take by mouth.     Multiple Vitamins-Minerals (CENTRUM SILVER PO) Take 1 tablet by mouth daily.      Multiple Vitamins-Minerals (ICAPS AREDS 2 PO) Take by mouth.     Polyethyl Glycol-Propyl Glycol (SYSTANE) 0.4-0.3 % SOLN Apply to eye in the morning and at bedtime.     No current facility-administered medications for this visit.    REVIEW OF  SYSTEMS  Negative other than noted in HPI     Objective:  Objective   Vitals:   02/06/23 0920  BP: (!) 150/78  Pulse: (!) 58  Resp: 20  Temp: 98.1 F (36.7 C)  SpO2: 97%  Weight: 156 lb (70.8 kg)  Height: 5\' 10"  (1.778 m)   Body mass index is 22.38 kg/m.  Physical Exam General: no acute distress Cardiac: hemodynamically stable, nontachycardic Pulm: normal work of breathing GI: non-tender, no pulsatile mass Neuro: alert, no focal deficit Extremities: no edema, cyanosis or wounds Vascular:   Right: Palpable femoral  Left: Palpable femoral   Data: CT abdomen pelvis independently reviewed Slight increased size of AAA from 6.3 to 6.1 cm with evidence of type II endoleak No evidence of type I or III endoleak Embolized and excluded left internal iliac aneurysm with no evidence of endoleak Stable right internal iliac aneurysm measuring approximately 2.3 cm     Assessment/Plan:     Charles Stephenson is a 88 y.o. male who is status post EVAR in 2019 followed by proximal aortic cuff placement in 2022 found to have a slowly enlarging aneurysm with a type II endoleak likely from lumbar artery or IMA backbleeding. We discussed that with this very slight growth that he is at extremely low risk of rupture although we will continue surveillance annually.  I also explained that if he has continued slow growth over the course of the next few years that at that time would likely be reasonable to undergo angiogram for endoleak evaluation and possible lumbar artery and/or IMA embolization.  Plan for continued annual surveillance with follow-up in 1 year with a CT  Recommendations to optimize cardiovascular risk: Abstinence from all tobacco products. Blood glucose control with goal A1c < 7%. Blood pressure control with goal blood pressure < 140/90 mmHg. Lipid reduction therapy with goal LDL-C <100 mg/dL  Aspirin 81mg  PO QD.  Atorvastatin 40-80mg  PO QD (or other "high intensity"  statin therapy).     Daria Pastures MD [facility]

## 2023-02-06 ENCOUNTER — Encounter: Payer: Self-pay | Admitting: Vascular Surgery

## 2023-02-06 ENCOUNTER — Ambulatory Visit: Payer: PPO | Admitting: Vascular Surgery

## 2023-02-06 VITALS — BP 150/78 | HR 58 | Temp 98.1°F | Resp 20 | Ht 70.0 in | Wt 156.0 lb

## 2023-02-06 DIAGNOSIS — I7143 Infrarenal abdominal aortic aneurysm, without rupture: Secondary | ICD-10-CM | POA: Diagnosis not present

## 2023-04-02 DIAGNOSIS — H04123 Dry eye syndrome of bilateral lacrimal glands: Secondary | ICD-10-CM | POA: Diagnosis not present

## 2023-04-02 DIAGNOSIS — H35363 Drusen (degenerative) of macula, bilateral: Secondary | ICD-10-CM | POA: Diagnosis not present

## 2023-04-02 DIAGNOSIS — H40003 Preglaucoma, unspecified, bilateral: Secondary | ICD-10-CM | POA: Diagnosis not present

## 2023-04-02 DIAGNOSIS — H353132 Nonexudative age-related macular degeneration, bilateral, intermediate dry stage: Secondary | ICD-10-CM | POA: Diagnosis not present

## 2023-04-02 DIAGNOSIS — H52203 Unspecified astigmatism, bilateral: Secondary | ICD-10-CM | POA: Diagnosis not present

## 2023-04-02 DIAGNOSIS — H5211 Myopia, right eye: Secondary | ICD-10-CM | POA: Diagnosis not present

## 2023-04-02 DIAGNOSIS — H524 Presbyopia: Secondary | ICD-10-CM | POA: Diagnosis not present

## 2023-04-23 DIAGNOSIS — M25521 Pain in right elbow: Secondary | ICD-10-CM | POA: Diagnosis not present

## 2023-04-23 DIAGNOSIS — M5416 Radiculopathy, lumbar region: Secondary | ICD-10-CM | POA: Diagnosis not present

## 2023-04-23 DIAGNOSIS — M79661 Pain in right lower leg: Secondary | ICD-10-CM | POA: Diagnosis not present

## 2023-05-07 DIAGNOSIS — M5416 Radiculopathy, lumbar region: Secondary | ICD-10-CM | POA: Diagnosis not present

## 2023-06-04 DIAGNOSIS — M5416 Radiculopathy, lumbar region: Secondary | ICD-10-CM | POA: Diagnosis not present

## 2023-06-06 DIAGNOSIS — I714 Abdominal aortic aneurysm, without rupture, unspecified: Secondary | ICD-10-CM | POA: Diagnosis not present

## 2023-06-06 DIAGNOSIS — M48062 Spinal stenosis, lumbar region with neurogenic claudication: Secondary | ICD-10-CM | POA: Diagnosis not present

## 2023-06-06 DIAGNOSIS — M539 Dorsopathy, unspecified: Secondary | ICD-10-CM | POA: Diagnosis not present

## 2023-06-06 DIAGNOSIS — M5416 Radiculopathy, lumbar region: Secondary | ICD-10-CM | POA: Diagnosis not present

## 2023-06-09 DIAGNOSIS — M25551 Pain in right hip: Secondary | ICD-10-CM | POA: Diagnosis not present

## 2023-06-13 DIAGNOSIS — M5416 Radiculopathy, lumbar region: Secondary | ICD-10-CM | POA: Diagnosis not present

## 2023-07-06 DIAGNOSIS — L821 Other seborrheic keratosis: Secondary | ICD-10-CM | POA: Diagnosis not present

## 2023-07-06 DIAGNOSIS — L570 Actinic keratosis: Secondary | ICD-10-CM | POA: Diagnosis not present

## 2023-07-06 DIAGNOSIS — Z85828 Personal history of other malignant neoplasm of skin: Secondary | ICD-10-CM | POA: Diagnosis not present

## 2023-07-06 DIAGNOSIS — L820 Inflamed seborrheic keratosis: Secondary | ICD-10-CM | POA: Diagnosis not present

## 2023-07-06 DIAGNOSIS — L308 Other specified dermatitis: Secondary | ICD-10-CM | POA: Diagnosis not present

## 2023-07-06 DIAGNOSIS — D225 Melanocytic nevi of trunk: Secondary | ICD-10-CM | POA: Diagnosis not present

## 2023-08-28 ENCOUNTER — Other Ambulatory Visit: Payer: Self-pay | Admitting: Family Medicine

## 2023-08-28 MED ORDER — ATORVASTATIN CALCIUM 20 MG PO TABS: ORAL_TABLET | ORAL | 3 refills | Status: AC

## 2023-08-28 NOTE — Telephone Encounter (Signed)
 Copied from CRM [id number]. Topic: Clinical - Medication Refill >> Aug 28, 2023  9:09 AM Charlet HERO wrote: Medication: atorvastatin  (LIPITOR) 20 MG tablet  Has the patient contacted their pharmacy? No Switched to new pharmacy  This is the patient's preferred pharmacy:    [id number] [place], [place] - [address] [place] [address] Phone: [phone] Fax: [phone]  Is this the correct pharmacy for this prescription? Yes If no, delete pharmacy and type the correct one.   Has the prescription been filled recently? Yes  Is the patient out of the medication? Yes  Has the patient been seen for an appointment in the last year OR does the patient have an upcoming appointment? Yes  Can we respond through MyChart? Yes  Agent: Please be advised that Rx refills may take up to 3 business days. We ask that you follow-up with your pharmacy.

## 2023-09-16 ENCOUNTER — Ambulatory Visit (INDEPENDENT_AMBULATORY_CARE_PROVIDER_SITE_OTHER): Payer: PPO | Admitting: Family Medicine

## 2023-09-16 ENCOUNTER — Encounter: Payer: Self-pay | Admitting: Family Medicine

## 2023-09-16 VITALS — BP 128/80 | HR 53 | Temp 97.9°F | Ht 70.0 in | Wt 152.0 lb

## 2023-09-16 DIAGNOSIS — I714 Abdominal aortic aneurysm, without rupture, unspecified: Secondary | ICD-10-CM | POA: Diagnosis not present

## 2023-09-16 DIAGNOSIS — Z0000 Encounter for general adult medical examination without abnormal findings: Secondary | ICD-10-CM

## 2023-09-16 DIAGNOSIS — E785 Hyperlipidemia, unspecified: Secondary | ICD-10-CM

## 2023-09-16 LAB — COMPREHENSIVE METABOLIC PANEL WITH GFR
ALT: 19 U/L (ref 0–53)
AST: 24 U/L (ref 0–37)
Albumin: 4.5 g/dL (ref 3.5–5.2)
Alkaline Phosphatase: 69 U/L (ref 39–117)
BUN: 17 mg/dL (ref 6–23)
CO2: 32 meq/L (ref 19–32)
Calcium: 9.5 mg/dL (ref 8.4–10.5)
Chloride: 99 meq/L (ref 96–112)
Creatinine, Ser: 0.84 mg/dL (ref 0.40–1.50)
GFR: 78.68 mL/min (ref >60.00)
Glucose, Bld: 88 mg/dL (ref 70–99)
Potassium: 4.4 meq/L (ref 3.5–5.1)
Sodium: 137 meq/L (ref 135–145)
Total Bilirubin: 1 mg/dL (ref 0.2–1.2)
Total Protein: 7.1 g/dL (ref 6.0–8.3)

## 2023-09-16 LAB — CBC WITH DIFFERENTIAL/PLATELET
Basophils Absolute: 0 K/uL (ref 0.0–0.1)
Basophils Relative: 0.8 % (ref 0.0–3.0)
Eosinophils Absolute: 0.1 K/uL (ref 0.0–0.7)
Eosinophils Relative: 2.2 % (ref 0.0–5.0)
HCT: 41.2 % (ref 39.0–52.0)
Hemoglobin: 13.7 g/dL (ref 13.0–17.0)
Lymphocytes Relative: 19.5 % (ref 12.0–46.0)
Lymphs Abs: 1 K/uL (ref 0.7–4.0)
MCHC: 33.3 g/dL (ref 30.0–36.0)
MCV: 90.6 fl (ref 78.0–100.0)
Monocytes Absolute: 0.7 K/uL (ref 0.1–1.0)
Monocytes Relative: 12.9 % — ABNORMAL HIGH (ref 3.0–12.0)
Neutro Abs: 3.3 K/uL (ref 1.4–7.7)
Neutrophils Relative %: 64.6 % (ref 43.0–77.0)
Platelets: 167 K/uL (ref 150.0–400.0)
RBC: 4.55 Mil/uL (ref 4.22–5.81)
RDW: 13.7 % (ref 11.5–15.5)
WBC: 5.1 K/uL (ref 4.0–10.5)

## 2023-09-16 LAB — LIPID PANEL
Cholesterol: 160 mg/dL (ref 0–200)
HDL: 65.3 mg/dL (ref >39.00)
LDL Cholesterol: 83 mg/dL (ref 0–99)
NonHDL: 94.62
Total CHOL/HDL Ratio: 2
Triglycerides: 56 mg/dL (ref 0.0–149.0)
VLDL: 11.2 mg/dL (ref 0.0–40.0)

## 2023-09-16 NOTE — Progress Notes (Signed)
 Phone: [phone]   Subjective:  Patient presents today for their annual physical. Chief complaint-noted.   See problem oriented charting- ROS- full  review of systems was completed and negative  Per full ROS sheet completed by patient except for topics noted under acute/chronic concerns  The following were reviewed and entered/updated in epic: Past Medical History:  Diagnosis Date   AAA (abdominal aortic aneurysm) (HCC)    Allergy    Arthritis    Basal cell carcinoma    02/09/2020: patient denies   Diverticulitis    3x through 18 months around 2012   Hepatitis    02/29/2020: patient denies hx   History of alcohol abuse    sober since age 51   History of vertigo    december 2007    Hypercholesterolemia    Inguinal hernia 2005   left side per patient   Seasonal allergies    no rx   Patient Active Problem List   Diagnosis Date Noted   History of basal cell carcinoma of skin 12/10/2015    Priority: Medium    Hyperlipidemia 03/12/2012    Priority: Medium    AAA (abdominal aortic aneurysm) (HCC) 03/12/2012    Priority: Medium    Macular degeneration 03/18/2019    Priority: Low   Vertigo 05/29/2016    Priority: Low   Colon cancer screening 04/21/2016    Priority: Low   Seasonal allergies     Priority: Low   Chest pain 03/12/2012    Priority: Low   Type II endoleak of aortic graft 03/07/2020   S/P AAA repair 02/22/2018   Lumbosacral radiculitis 09/04/2017   Low back pain 04/21/2017   Past Surgical History:  Procedure Laterality Date   ABDOMINAL AORTIC ENDOVASCULAR STENT GRAFT  04/13/2017   w/EMBOLIZATION LEFT HYPOGASTRIC USING INTERLOCK-35 DETACHABLE COIL/notes 04/13/2017   ABDOMINAL AORTIC ENDOVASCULAR STENT GRAFT N/A 04/13/2017   Procedure: ABDOMINAL AORTIC ENDOVASCULAR STENT GRAFT;  Surgeon: Oris Krystal FALCON, MD;  Location: [facility] OR;  Service: Vascular;  Laterality: N/A;   AORTOGRAM N/A 03/07/2020   Procedure: AORTOGRAM with insertion of  Aortic Cuff;  Surgeon:  Oris Krystal FALCON, MD;  Location: [facility];  Service: Vascular;  Laterality: N/A;   Aortogram and placement of proximal aortic cuff  03/07/2020   BACK SURGERY     BASAL CELL CARCINOMA EXCISION  X~ 9   chest, back, left leg (04/13/2017)   CARDIOVASCULAR STRESS TEST  03/16/2012   CATARACT EXTRACTION W/ INTRAOCULAR LENS  IMPLANT, BILATERAL Bilateral    L pupil larger after lens implant   COLONOSCOPY     EMBOLIZATION N/A 04/13/2017   Procedure: EMBOLIZATION LEFT HYPOGASTRIC USING INTERLOCK-35 DETACHABLE COIL;  Surgeon: Oris Krystal FALCON, MD;  Location: [facility];  Service: Vascular;  Laterality: N/A;   HERNIA REPAIR Right 08/2020   INGUINAL HERNIA REPAIR Right 2005   MOHS SURGERY  09/2014; ~ 2014   BCC; nose and left ear   POSTERIOR FUSION LUMBAR SPINE  10/15/1982   the 2 lowest discs fused   ULTRASOUND GUIDANCE FOR VASCULAR ACCESS Right 03/07/2020   Procedure: ULTRASOUND GUIDANCE FOR VASCULAR ACCESS, right femoral artery;  Surgeon: Oris Krystal FALCON, MD;  Location: [facility] OR;  Service: Vascular;  Laterality: Right;    Family History  Problem Relation Age of Onset   AAA (abdominal aortic aneurysm) Father        sounds like died of DIC- otherwise healthy   Peripheral vascular disease Father        Aneurysm- Aorta  AAA (abdominal aortic aneurysm) Brother        states multiple medicle issues but died of pulmonary fibrosis- was told smoking related   Heart disease Brother 57       Before age 42   Heart attack Brother        x2   Alcohol abuse Brother        sober 25 years when he died   Prostate cancer Brother    COPD Brother    Cancer Mother        Stomach    Medications- reviewed and updated Current Outpatient Medications  Medication Sig Dispense Refill   atorvastatin  (LIPITOR) 20 MG tablet TAKE 1 TABLET BY MOUTH EVERY DAY 90 tablet 3   Cholecalciferol (VITAMIN D3) 50 MCG (2000 UT) capsule Take by mouth.     Multiple Vitamins-Minerals (ICAPS AREDS 2 PO) Take by mouth.     Polyethyl  Glycol-Propyl Glycol (SYSTANE) 0.4-0.3 % SOLN Apply to eye in the morning and at bedtime.     Multiple Vitamins-Minerals (CENTRUM SILVER PO) Take 1 tablet by mouth daily.      No current facility-administered medications for this visit.    Allergies-reviewed and updated No Known Allergies  Social History   Social History Narrative   Married to wife Skyeler Smola (patient of mine). 2 kids- 1 local. No grandkids and wont have them. No pets.       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 Oropharynx normal Neck: no thyromegaly CV: bradycardic,  no murmurs rubs or gallops Lungs: CTAB no crackles, wheeze, rhonchi Abdomen: soft/nontender/nondistended/normal bowel sounds. No rebound or guarding.  Ext: no edema Skin: warm, dry Neuro: grossly normal, moves all extremities, PERRLA   Assessment and Plan  89 y.o. male presenting for annual physical.  Health Maintenance counseling: 1. Anticipatory guidance: Patient counseled regarding regular dental exams -q4 months, eye exams -yearly with Dr. Janit [facility]- macular degeneration but stable,  avoiding smoking and second hand smoke , limiting alcohol to 2 beverages per day - sober 46 years in november, no illicit drugs .   2. Risk factor reduction:  Advised patient of need for regular exercise and diet rich and fruits and vegetables to reduce risk of heart attack and stroke.  Exercise- still regular walking daily and then some exercises in the gym.  Diet/weight management-very healthy weight overall- would prefer to avoid weight loss. Does  ensure daily but can slow appetite depending on time- has to find right time- mainly evenings. Appetite/drive lower . Some muscle mass loss. May go to 2 a day Wt Readings from Last 3 Encounters:  09/16/23 152 lb (68.9 kg)  02/06/23 156 lb (70.8 kg)  11/10/22 156 lb (70.8 kg)  3. Immunizations/screenings/ancillary studies- COVID 19 - will get in the fall Immunization History  Administered Date(s) Administered   BCG 12/14/2017   DT (Pediatric) 08/20/2017   Fluad Quad(high Dose 65+) 12/13/2018   Influenza Split 11/28/2016  Influenza, High Dose Seasonal PF 12/10/2015, 12/05/2016, 12/14/2017, 12/08/2019, 12/26/2020, 12/04/2021, 12/09/2022   Influenza,inj,Quad PF,6+ Mos 12/08/2019   Influenza-Unspecified 12/01/2012, 11/28/2016   Moderna Covid-19 Fall Seasonal Vaccine 4yrs & older 01/08/2022   Moderna Covid-19 Vaccine Bivalent Booster 78yrs & up 01/30/2021, 12/09/2022   Moderna SARS-COV2 Booster Vaccination 01/08/2022   Moderna Sars-Covid-2 Vaccination 03/15/2019, 04/12/2019, 01/04/2020, 06/20/2020, 01/30/2021   PPD Test 12/14/2017   Pneumococcal Conjugate-13 05/29/2016   Pneumococcal Polysaccharide-23 08/07/2017   Rabies, IM 08/20/2017, 08/23/2017, 09/03/2017   Respiratory Syncytial Virus Vaccine,Recomb Aduvanted(Arexvy) 12/04/2021   Tdap 08/20/2017   Zoster Recombinant(Shingrix) 07/14/2019, 12/21/2019   Zoster, Live 07/14/2019, 12/21/2019  4. Prostate cancer screening- past age based screening recommendations  - no change in urinary symptoms. Nocturia stable twice a night  5. Colon cancer screening - no recall due to age after 03/25/2016 colonoscopy with [facility]- no blood in stool  6. Skin cancer screening- [facility] dermatology Dr. Court- didn't even need a biopsy last time- first time in years! SABRA advised regular sunscreen use. Denies worrisome, changing, or new skin lesions.  7. Smoking associated screening (lung cancer screening, AAA screen 65-75, UA)- never smoker 8. STD screening - only  active with wife  Status of chronic or acute concerns   #Right hip issues/radiculopathy- working with Dr. Bonner- prior injections in back helped but has upcoming visit more for hip issues mainly at night- still able to walk ok. Wants to follow up with orthopedic as next step  #AAA status postrepair 03/07/2020 with Dr. Guillermo RAMAN: Continues to have type II endoleak but overall stable and plans for yearly follow-up with vascular surgery Dr. Boykin- had visit in december  A/P: overall stable- continue to monitor    #hyperlipidemia S: Medication:Atorvastatin  20 mg daily- tolerating Lab Results  Component Value Date   CHOL 137 09/15/2022   HDL 63.20 09/15/2022   LDLCALC 62 09/15/2022   TRIG 61.0 09/15/2022   CHOLHDL 2 09/15/2022  A/P: lipids looked great last year- update today- likely continue current medications    #Rare reflux-over-the-counter medications as needed- no recent issues  - eating slower and smaller bites helps  #Inguinal hernia on the right-was referred to general surgery 06/28/2020- and repair 08/2020 with Dr. Eletha. Prior left surgery as well.   #recent tickbike- was not on long. No systemic symptoms- gave red flags to look out for. Was on right leg- no worrisome areas noted today  Recommended follow up: Return in about 1 year (around 09/15/2024) for physical or sooner if needed.Schedule b4 you leave. Future Appointments  Date Time Provider Department Center  11/11/2023 10:00 AM [facility] ANNUAL WELLNESS VISIT 1 [facility]   Lab/Order associations: fasting   ICD-10-CM   1. Preventative health care  Z00.00     2. Abdominal aortic aneurysm (AAA) without rupture, unspecified part (HCC)  I71.40     3. Hyperlipidemia, unspecified hyperlipidemia type  E78.5 Comprehensive metabolic panel with GFR    CBC with Differential/Platelet    Lipid panel      No orders of the defined types were placed in this encounter.   Return precautions advised.  Garnette Lukes, MD

## 2023-09-16 NOTE — Patient Instructions (Addendum)
 Please stop by lab before you go If you have mychart- we will send your results within 3 business days of us  receiving them.  If you do not have mychart- we will call you about results within 5 business days of us  receiving them.  *please also note that you will see labs on mychart as soon as they post. I will later go in and write notes on them- will say notes from Dr. Katrinka   Recommended follow up: Return in about 1 year (around 09/15/2024) for physical or sooner if needed.Schedule b4 you leave.

## 2023-09-17 ENCOUNTER — Ambulatory Visit: Payer: Self-pay | Admitting: Family Medicine

## 2023-09-17 ENCOUNTER — Encounter: Payer: Self-pay | Admitting: Family Medicine

## 2023-09-24 DIAGNOSIS — M25551 Pain in right hip: Secondary | ICD-10-CM | POA: Diagnosis not present

## 2023-10-15 DIAGNOSIS — Z961 Presence of intraocular lens: Secondary | ICD-10-CM | POA: Diagnosis not present

## 2023-10-15 DIAGNOSIS — H04123 Dry eye syndrome of bilateral lacrimal glands: Secondary | ICD-10-CM | POA: Diagnosis not present

## 2023-10-15 DIAGNOSIS — H524 Presbyopia: Secondary | ICD-10-CM | POA: Diagnosis not present

## 2023-10-15 DIAGNOSIS — H353132 Nonexudative age-related macular degeneration, bilateral, intermediate dry stage: Secondary | ICD-10-CM | POA: Diagnosis not present

## 2023-10-15 DIAGNOSIS — H43813 Vitreous degeneration, bilateral: Secondary | ICD-10-CM | POA: Diagnosis not present

## 2023-10-15 DIAGNOSIS — H52203 Unspecified astigmatism, bilateral: Secondary | ICD-10-CM | POA: Diagnosis not present

## 2023-10-15 DIAGNOSIS — H40003 Preglaucoma, unspecified, bilateral: Secondary | ICD-10-CM | POA: Diagnosis not present

## 2023-10-19 DIAGNOSIS — R599 Enlarged lymph nodes, unspecified: Secondary | ICD-10-CM | POA: Diagnosis not present

## 2023-10-26 DIAGNOSIS — R599 Enlarged lymph nodes, unspecified: Secondary | ICD-10-CM | POA: Diagnosis not present

## 2023-11-11 ENCOUNTER — Ambulatory Visit (INDEPENDENT_AMBULATORY_CARE_PROVIDER_SITE_OTHER)

## 2023-11-11 VITALS — Ht 70.0 in | Wt 152.0 lb

## 2023-11-11 DIAGNOSIS — Z0000 Encounter for general adult medical examination without abnormal findings: Secondary | ICD-10-CM | POA: Diagnosis not present

## 2023-11-11 NOTE — Patient Instructions (Signed)
 Charles Stephenson,  Thank you for taking the time for your Medicare Wellness Visit. I appreciate your continued commitment to your health goals. Please review the care plan we discussed, and feel free to reach out if I can assist you further.  Medicare recommends these wellness visits once per year to help you and your care team stay ahead of potential health issues. These visits are designed to focus on prevention, allowing your provider to concentrate on managing your acute and chronic conditions during your regular appointments.  Please note that Annual Wellness Visits do not include a physical exam. Some assessments may be limited, especially if the visit was conducted virtually. If needed, we may recommend a separate in-person follow-up with your provider.  Ongoing Care Seeing your primary care provider every 3 to 6 months helps us  monitor your health and provide consistent, personalized care.   Referrals If a referral was made during today's visit and you haven't received any updates within two weeks, please contact the referred provider directly to check on the status.  Recommended Screenings:  Health Maintenance  Topic Date Due   Flu Shot  10/02/2023   COVID-19 Vaccine (9 - 2025-26 season) 11/02/2023   Medicare Annual Wellness Visit  11/10/2023   DTaP/Tdap/Td vaccine (3 - Td or Tdap) 08/21/2027   Pneumococcal Vaccine for age over 98  Completed   Zoster (Shingles) Vaccine  Completed   HPV Vaccine  Aged Out   Meningitis B Vaccine  Aged Out       11/10/2022   11:06 AM  Advanced Directives  Does Patient Have a Medical Advance Directive? Yes  Type of Estate agent of [place];Living will  Copy of Healthcare Power of Attorney in Chart? No - copy requested   Advance Care Planning is important because it: Ensures you receive medical care that aligns with your values, goals, and preferences. Provides guidance to your family and loved ones, reducing the emotional burden  of decision-making during critical moments.  Vision: Annual vision screenings are recommended for early detection of glaucoma, cataracts, and diabetic retinopathy. These exams can also reveal signs of chronic conditions such as diabetes and high blood pressure.  Dental: Annual dental screenings help detect early signs of oral cancer, gum disease, and other conditions linked to overall health, including heart disease and diabetes.  Please see the attached documents for additional preventive care recommendations.

## 2023-11-11 NOTE — Progress Notes (Signed)
 Subjective:   Charles Stephenson is a 89 y.o. who presents for a Medicare Wellness preventive visit.  As a reminder, Annual Wellness Visits don't include a physical exam, and some assessments may be limited, especially if this visit is performed virtually. We may recommend an in-person follow-up visit with your provider if needed.  Visit Complete: Virtual I connected with  [name] on 11/11/23 by a audio enabled telemedicine application and verified that I am speaking with the correct person using two identifiers.  Patient Location: Home  Provider Location: Home Office  I discussed the limitations of evaluation and management by telemedicine. The patient expressed understanding and agreed to proceed.  Vital Signs: Because this visit was a virtual/telehealth visit, some criteria may be missing or patient reported. Any vitals not documented were not able to be obtained and vitals that have been documented are patient reported.  VideoDeclined- This patient declined Librarian, academic. Therefore the visit was completed with audio only.  Persons Participating in Visit: Patient.  AWV Questionnaire: Yes: Patient Medicare AWV questionnaire was completed by the patient on 11/10/23; I have confirmed that all information answered by patient is correct and no changes since this date.  Cardiac Risk Factors include: advanced age (>35men, >1 women);dyslipidemia;male gender     Objective:    Today's Vitals   11/11/23 0951  Weight: 152 lb (68.9 kg)  Height: 5' 10 (1.778 m)   Body mass index is 21.81 kg/m.     11/11/2023    9:54 AM 11/10/2022   11:06 AM 11/28/2021   11:33 AM 11/19/2020   11:08 AM 03/07/2020    4:47 PM 03/07/2020    9:12 AM 02/29/2020   11:27 AM  Advanced Directives  Does Patient Have a Medical Advance Directive? Yes Yes Yes Yes Yes  Yes  Type of Estate agent of [place];Living will Healthcare Power of  [place];Living will Healthcare Power of [place];Living will Healthcare Power of [employer] of [place];Living will  Healthcare Power of [place];Living will  Does patient want to make changes to medical advance directive?     No - Patient declined No - Patient declined No - Patient declined  Copy of Healthcare Power of Attorney in Chart? No - copy requested No - copy requested No - copy requested No - copy requested Yes - validated most recent copy scanned in chart (See row information)  Yes - validated most recent copy scanned in chart (See row information)    Current Medications (verified) Outpatient Encounter Medications as of 11/11/2023  Medication Sig   atorvastatin  (LIPITOR) 20 MG tablet TAKE 1 TABLET BY MOUTH EVERY DAY   Cholecalciferol (VITAMIN D3) 50 MCG (2000 UT) capsule Take by mouth.   Multiple Vitamins-Minerals (CENTRUM SILVER PO) Take 1 tablet by mouth daily.    Multiple Vitamins-Minerals (ICAPS AREDS 2 PO) Take by mouth.   Polyethyl Glycol-Propyl Glycol (SYSTANE) 0.4-0.3 % SOLN Apply to eye in the morning and at bedtime.   No facility-administered encounter medications on file as of 11/11/2023.    Allergies (verified) Patient has no known allergies.   History: Past Medical History:  Diagnosis Date   AAA (abdominal aortic aneurysm) (HCC)    Allergy    Arthritis    Basal cell carcinoma    02/09/2020: patient denies   Diverticulitis    3x through 18 months around 2012   Hepatitis    02/29/2020: patient denies hx   History of alcohol abuse  sober since age 41   History of vertigo    december 2007    Hypercholesterolemia    Inguinal hernia 2005   left side per patient   Seasonal allergies    no rx   Past Surgical History:  Procedure Laterality Date   ABDOMINAL AORTIC ENDOVASCULAR STENT GRAFT  04/13/2017   w/EMBOLIZATION LEFT HYPOGASTRIC USING INTERLOCK-35 DETACHABLE COIL/notes 04/13/2017   ABDOMINAL AORTIC ENDOVASCULAR STENT GRAFT N/A 04/13/2017    Procedure: ABDOMINAL AORTIC ENDOVASCULAR STENT GRAFT;  Surgeon: Oris Krystal FALCON, MD;  Location: [facility] OR;  Service: Vascular;  Laterality: N/A;   AORTOGRAM N/A 03/07/2020   Procedure: AORTOGRAM with insertion of  Aortic Cuff;  Surgeon: Oris Krystal FALCON, MD;  Location: [facility] OR;  Service: Vascular;  Laterality: N/A;   Aortogram and placement of proximal aortic cuff  03/07/2020   BACK SURGERY     BASAL CELL CARCINOMA EXCISION  X~ 9   chest, back, left leg (04/13/2017)   CARDIOVASCULAR STRESS TEST  03/16/2012   CATARACT EXTRACTION W/ INTRAOCULAR LENS  IMPLANT, BILATERAL Bilateral    L pupil larger after lens implant   COLONOSCOPY     EMBOLIZATION N/A 04/13/2017   Procedure: EMBOLIZATION LEFT HYPOGASTRIC USING INTERLOCK-35 DETACHABLE COIL;  Surgeon: Oris Krystal FALCON, MD;  Location: [facility] OR;  Service: Vascular;  Laterality: N/A;   HERNIA REPAIR Right 08/2020   INGUINAL HERNIA REPAIR Right 2005   MOHS SURGERY  09/2014; ~ 2014   BCC; nose and left ear   POSTERIOR FUSION LUMBAR SPINE  10/15/1982   the 2 lowest discs fused   ULTRASOUND GUIDANCE FOR VASCULAR ACCESS Right 03/07/2020   Procedure: ULTRASOUND GUIDANCE FOR VASCULAR ACCESS, right femoral artery;  Surgeon: Oris Krystal FALCON, MD;  Location: [facility];  Service: Vascular;  Laterality: Right;   Family History  Problem Relation Age of Onset   AAA (abdominal aortic aneurysm) Father        sounds like died of DIC- otherwise healthy   Peripheral vascular disease Father        Aneurysm- Aorta   AAA (abdominal aortic aneurysm) Brother        states multiple medicle issues but died of pulmonary fibrosis- was told smoking related   Heart disease Brother 57       Before age 13   Heart attack Brother        x2   Alcohol abuse Brother        sober 25 years when he died   Prostate cancer Brother    COPD Brother    Cancer Mother        Stomach   Social History   Socioeconomic History   Marital status: Married    Spouse name: Not on file   Number of  children: 2   Years of education: Not on file   Highest education level: Bachelor's degree (e.g., BA, AB, BS)  Occupational History   Occupation: Retired    Comment: Psychologist, 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 2 kids- 1 local. No grandkids and wont  have them. No pets.       Retired in 2008 from [employer] to [employer]: golfing 2-3 days a week, down to place in [place]- walk at [employer], walks 2.5 miles daily, service work      Lives in [place]    Social Drivers of Health   Financial Resource Strain: Low Risk  (11/10/2023)   Overall Financial Resource Strain (CARDIA)    Difficulty of Paying Living Expenses: Not hard at all  Food Insecurity: No Food Insecurity (11/10/2023)   Hunger Vital Sign    Worried About Running Out of Food in the Last Year: Never true    Ran Out of Food in the Last Year: Never true  Transportation Needs: No Transportation Needs (11/10/2023)   PRAPARE - Administrator, Civil Service (Medical): No    Lack of Transportation (Non-Medical): No  Physical Activity: Sufficiently Active (11/10/2023)   Exercise Vital Sign    Days of Exercise per Week: 7 days    Minutes of Exercise per Session: 90 min  Stress: No Stress Concern Present (11/10/2023)   [employer] of Occupational Health - Occupational Stress Questionnaire    Feeling of Stress: Not at all  Social Connections: Socially Integrated (11/10/2023)   Social Connection and Isolation Panel    Frequency of Communication with Friends and Family: More than three times a week     Frequency of Social Gatherings with Friends and Family: More than three times a week    Attends Religious Services: More than 4 times per year    Active Member of [employer] or Organizations: Yes    Attends Engineer, structural: More than 4 times per year    Marital Status: Married    Tobacco Counseling Counseling given: Not Answered    Clinical Intake:  Pre-visit preparation completed: Yes  Pain : No/denies pain     BMI - recorded: 21.81 Nutritional Status: BMI of 19-24  Normal Nutritional Risks: None Diabetes: No  No results found for: HGBA1C   How often do you need to have someone help you when you read instructions, pamphlets, or other written materials from your doctor or pharmacy?: 1 - Never  Interpreter Needed?: No  Information entered by :: Ellouise Haws, LPN   Activities of Daily Living     11/10/2023   10:52 AM  In your present state of health, do you have any difficulty performing the following activities:  Hearing? 0  Vision? 0  Difficulty concentrating or making decisions? 0  Walking or climbing stairs? 0  Dressing or bathing? 0  Doing errands, shopping? 0  Preparing Food and eating ? N  Using the Toilet? N  In the past six months, have you accidently leaked urine? N  Do you have problems with loss of bowel control? N  Managing your Medications? N  Managing your Finances? N  Housekeeping or managing your Housekeeping? N    Patient Care Team: Katrinka Garnette KIDD, MD as PCP - General (Family Medicine) Celestia Agent, MD (Inactive) as Consulting Physician (Gastroenterology) Dermatology, [facility] as Consulting Physician  I have updated your Care Teams any recent Medical Services you may have received from other providers in the past year.     Assessment:   This is a routine wellness examination for Charles Stephenson.  Hearing/Vision screen Vision Screening - Comments:: Wears rx glasses - up to date with routine eye exams with  Dr janit  Goals  Addressed             This Visit's Progress    Patient Stated       Maintain health and activity        Depression Screen     11/11/2023    9:54 AM 11/10/2022   11:06 AM 09/15/2022   10:51 AM 12/26/2021   11:32 AM 11/28/2021   11:31 AM 11/19/2020   11:07 AM 04/03/2020    1:14 PM  PHQ 2/9 Scores  PHQ - 2 Score 0 0 0 0 0 0 0  PHQ- 9 Score  0 0        Fall Risk     11/10/2023   10:52 AM 11/06/2022   10:52 AM 09/15/2022   10:51 AM 11/28/2021   11:33 AM 11/19/2020   11:09 AM  Fall Risk   Falls in the past year? 0 0 0 0 0  Number falls in past yr: 0  0 0 0  Injury with Fall? 0  0 0 0  Risk for fall due to : No Fall Risks Impaired vision No Fall Risks No Fall Risks Impaired vision  Follow up Falls prevention discussed Falls prevention discussed Falls evaluation completed Falls prevention discussed  Falls prevention discussed      Data saved with a previous flowsheet row definition    MEDICARE RISK AT HOME:  Medicare Risk at Home Any stairs in or around the home?: (Patient-Rptd) No Home free of loose throw rugs in walkways, pet beds, electrical cords, etc?: (Patient-Rptd) Yes Adequate lighting in your home to reduce risk of falls?: (Patient-Rptd) Yes Life alert?: (Patient-Rptd) Yes Use of a cane, walker or w/c?: (Patient-Rptd) No Grab bars in the bathroom?: (Patient-Rptd) Yes Shower chair or bench in shower?: (Patient-Rptd) No Elevated toilet seat or a handicapped toilet?: (Patient-Rptd) No  TIMED UP AND GO:  Was the test performed?  No  Cognitive Function: 6CIT completed        11/11/2023    9:56 AM 11/10/2022   11:07 AM 11/28/2021   11:35 AM 11/19/2020   11:11 AM 03/18/2019    1:30 PM  6CIT Screen  What Year? 0 points 0 points 0 points 0 points 0 points  What month? 0 points 0 points 0 points 0 points 0 points  What time? 0 points 0 points 0 points 0 points 0 points  Count back from 20 0 points 0 points 0 points 0 points 0 points  Months in reverse 0 points 0 points 0  points 0 points 0 points  Repeat phrase 0 points 0 points 0 points 0 points 0 points  Total Score 0 points 0 points 0 points 0 points 0 points    Immunizations Immunization History  Administered Date(s) Administered   BCG 12/14/2017   DT (Pediatric) 08/20/2017   Fluad Quad(high Dose 65+) 12/13/2018   INFLUENZA, HIGH DOSE SEASONAL PF 12/10/2015, 12/05/2016, 12/14/2017, 12/08/2019, 12/26/2020, 12/04/2021, 12/09/2022   Influenza Split 11/28/2016   Influenza,inj,Quad PF,6+ Mos 12/08/2019   Influenza-Unspecified 12/01/2012, 11/28/2016   Moderna Covid-19 Fall Seasonal Vaccine 7yrs & older 01/08/2022   Moderna Covid-19 Vaccine Bivalent Booster 3yrs & up 01/30/2021, 12/09/2022   Moderna SARS-COV2 Booster Vaccination 01/08/2022   Moderna Sars-Covid-2 Vaccination 03/15/2019, 04/12/2019, 01/04/2020, 06/20/2020, 01/30/2021   PPD Test 12/14/2017   Pneumococcal Conjugate-13 05/29/2016   Pneumococcal Polysaccharide-23 08/07/2017   Rabies, IM 08/20/2017, 08/23/2017, 09/03/2017   Respiratory Syncytial Virus Vaccine,Recomb Aduvanted(Arexvy) 12/04/2021   Tdap 08/20/2017   Zoster Recombinant(Shingrix) 07/14/2019,  12/21/2019   Zoster, Live 07/14/2019, 12/21/2019    Screening Tests Health Maintenance  Topic Date Due   Influenza Vaccine  10/02/2023   COVID-19 Vaccine (9 - 2025-26 season) 11/02/2023   Medicare Annual Wellness (AWV)  11/10/2024   DTaP/Tdap/Td (3 - Td or Tdap) 08/21/2027   Pneumococcal Vaccine: 50+ Years  Completed   Zoster Vaccines- Shingrix  Completed   HPV VACCINES  Aged Out   Meningococcal B Vaccine  Aged Out    Health Maintenance Items Addressed: See Nurse Notes at the end of this note  Additional Screening:  Vision Screening: Recommended annual ophthalmology exams for early detection of glaucoma and other disorders of the eye. Is the patient up to date with their annual eye exam?  Yes  Who is the provider or what is the name of the office in which the patient attends  annual eye exams? Dr Janit   Dental Screening: Recommended annual dental exams for proper oral hygiene  Community Resource Referral / Chronic Care Management: CRR required this visit?  No   CCM required this visit?  No   Plan:    I have personally reviewed and noted the following in the patient's chart:   Medical and social history Use of alcohol, tobacco or illicit drugs  Current medications and supplements including opioid prescriptions. Patient is not currently taking opioid prescriptions. Functional ability and status Nutritional status Physical activity Advanced directives List of other physicians Hospitalizations, surgeries, and ER visits in previous 12 months Vitals Screenings to include cognitive, depression, and falls Referrals and appointments  In addition, I have reviewed and discussed with patient certain preventive protocols, quality metrics, and best practice recommendations. A written personalized care plan for preventive services as well as general preventive health recommendations were provided to patient.   Ellouise VEAR Haws, LPN   [id number]   After Visit Summary: (MyChart) Due to this being a telephonic visit, the after visit summary with patients personalized plan was offered to patient via MyChart   Notes: Nothing significant to report at this time.

## 2023-12-01 DIAGNOSIS — L570 Actinic keratosis: Secondary | ICD-10-CM | POA: Diagnosis not present

## 2023-12-01 DIAGNOSIS — Z85828 Personal history of other malignant neoplasm of skin: Secondary | ICD-10-CM | POA: Diagnosis not present

## 2023-12-01 DIAGNOSIS — L820 Inflamed seborrheic keratosis: Secondary | ICD-10-CM | POA: Diagnosis not present

## 2023-12-02 ENCOUNTER — Encounter: Payer: Self-pay | Admitting: Family Medicine

## 2023-12-08 DIAGNOSIS — D485 Neoplasm of uncertain behavior of skin: Secondary | ICD-10-CM | POA: Diagnosis not present

## 2023-12-08 DIAGNOSIS — Z85828 Personal history of other malignant neoplasm of skin: Secondary | ICD-10-CM | POA: Diagnosis not present

## 2024-01-11 DIAGNOSIS — D1801 Hemangioma of skin and subcutaneous tissue: Secondary | ICD-10-CM | POA: Diagnosis not present

## 2024-01-11 DIAGNOSIS — L821 Other seborrheic keratosis: Secondary | ICD-10-CM | POA: Diagnosis not present

## 2024-01-11 DIAGNOSIS — L570 Actinic keratosis: Secondary | ICD-10-CM | POA: Diagnosis not present

## 2024-01-11 DIAGNOSIS — L814 Other melanin hyperpigmentation: Secondary | ICD-10-CM | POA: Diagnosis not present

## 2024-01-11 DIAGNOSIS — D485 Neoplasm of uncertain behavior of skin: Secondary | ICD-10-CM | POA: Diagnosis not present

## 2024-01-11 DIAGNOSIS — C44519 Basal cell carcinoma of skin of other part of trunk: Secondary | ICD-10-CM | POA: Diagnosis not present

## 2024-01-11 DIAGNOSIS — Z85828 Personal history of other malignant neoplasm of skin: Secondary | ICD-10-CM | POA: Diagnosis not present

## 2024-01-13 ENCOUNTER — Encounter: Payer: Self-pay | Admitting: Family Medicine

## 2024-01-13 NOTE — Telephone Encounter (Signed)
 Patient sent a MyChart message with his updated vaccine dates. Immunizations have been updated in both charts.

## 2024-01-18 DIAGNOSIS — C44519 Basal cell carcinoma of skin of other part of trunk: Secondary | ICD-10-CM | POA: Diagnosis not present

## 2024-01-18 DIAGNOSIS — L570 Actinic keratosis: Secondary | ICD-10-CM | POA: Diagnosis not present

## 2024-01-18 DIAGNOSIS — Z85828 Personal history of other malignant neoplasm of skin: Secondary | ICD-10-CM | POA: Diagnosis not present

## 2024-01-21 DIAGNOSIS — J019 Acute sinusitis, unspecified: Secondary | ICD-10-CM | POA: Diagnosis not present

## 2024-01-21 DIAGNOSIS — H6121 Impacted cerumen, right ear: Secondary | ICD-10-CM | POA: Diagnosis not present

## 2024-01-21 DIAGNOSIS — J029 Acute pharyngitis, unspecified: Secondary | ICD-10-CM | POA: Diagnosis not present

## 2024-02-01 ENCOUNTER — Other Ambulatory Visit: Payer: Self-pay

## 2024-02-01 DIAGNOSIS — I7143 Infrarenal abdominal aortic aneurysm, without rupture: Secondary | ICD-10-CM

## 2024-02-03 ENCOUNTER — Ambulatory Visit: Payer: Self-pay

## 2024-02-03 NOTE — Telephone Encounter (Signed)
 Would he be ok with Friday 11 20- hospi follow up slot?

## 2024-02-03 NOTE — Telephone Encounter (Signed)
 1st attempt at calling patient back--left a voicemail               Copied from CRM [id number]. Topic: Clinical - Red Word Triage >> Feb 03, 2024 11:39 AM Deaijah H wrote: Red Word that prompted transfer to Nurse Triage: Prolong sore throat ; swollen ; trouble swallowing

## 2024-02-03 NOTE — Telephone Encounter (Signed)
 FYI Only or Action Required?: Action required by provider: request for appointment and referral request. Work in appt with pcp requested, requests cb or MyChart message back today.   Patient was last seen in primary care on 09/16/2023 by Katrinka Garnette KIDD, MD.  Called Nurse Triage reporting Sore Throat.  Symptoms began several weeks ago.  Interventions attempted: Prescription medications: completed 10 day antibiotic and Rest, hydration, or home remedies.  Symptoms are: gradually improving.  Triage Disposition: See PCP When Office is Open (Within 3 Days)  Patient/caregiver understands and will follow disposition?: No, refuses disposition    Reason for Disposition  [1] Sore throat with cough/cold symptoms AND [2] present > 5 days  Answer Assessment - Initial Assessment Questions PA in clinic at retirement home 3 weeks ago rx abx, he completed one week ago and throat pain is still tender. No difficulty swallowing but painful to swallow. No swelling. Has sinus conditions he used to be followed by ENT. He has gone back to PA at retirement community who let him know that his  nasal passages are inflamed.  No appointments with pcp until late December patient is requesting work in to [employer] schedule or if he should be referred to ENT. Patient asks for follow up call today re: work in appointment and/or ENT referral, also asked that call back be from office directly as 800 number not in director and shows spam, he states he will also check his MyChart message for appointment and/or recommendation.    1. ONSET: When did the throat start hurting? (Hours or days ago)      3 weeks ago  2. SEVERITY: How bad is the sore throat? (Scale 1-10; mild, moderate or severe)     Painful only when swallowing 3. STREP EXPOSURE: Has there been any exposure to strep within the past week? If Yes, ask: What type of contact occurred?      no 4.  VIRAL SYMPTOMS: Are there any symptoms of a cold, such as a  runny nose, cough, hoarse voice or red eyes?      denies 5. FEVER: Do you have a fever? If Yes, ask: What is your temperature, how was it measured, and when did it start?     denies 6. PUS ON THE TONSILS: Is there pus on the tonsils in the back of your throat?     denies 7. OTHER SYMPTOMS: Do you have any other symptoms? (e.g., difficulty breathing, headache, rash)     denies 8. PREGNANCY: Is there any chance you are pregnant? When was your last menstrual period?  Protocols used: Sore Throat-A-AH

## 2024-02-03 NOTE — Telephone Encounter (Signed)
 Scheduled

## 2024-02-03 NOTE — Telephone Encounter (Signed)
 Please see message and advise. Patient requesting work in appt or referral to ent for nasal passage inflammation per PA at assisted living.

## 2024-02-05 ENCOUNTER — Ambulatory Visit: Admitting: Family Medicine

## 2024-02-05 VITALS — BP 126/74 | HR 68 | Temp 97.2°F | Ht 70.0 in | Wt 158.8 lb

## 2024-02-05 DIAGNOSIS — J329 Chronic sinusitis, unspecified: Secondary | ICD-10-CM | POA: Diagnosis not present

## 2024-02-05 DIAGNOSIS — B9689 Other specified bacterial agents as the cause of diseases classified elsewhere: Secondary | ICD-10-CM

## 2024-02-05 MED ORDER — AMOXICILLIN-POT CLAVULANATE 875-125 MG PO TABS: 1.0000 | ORAL_TABLET | Freq: Two times a day (BID) | ORAL | 0 refills | Status: AC

## 2024-02-05 NOTE — Progress Notes (Signed)
 Phone [phone] In person visit   Subjective:   Charles Stephenson is a 89 y.o. year old very pleasant male patient who presents for/with See problem oriented charting Chief Complaint  Patient presents with   Nasal Congestion    Nasal congestion and sore throat for a month. No headache and no cough. Primarily in the throat, doesn't feel any post nasal drip. Was see by PA at assisted living. Was on 7 day amoxicillin  then just started flonase . Has tried several OTC meds that are not working.     Past Medical History-  Patient Active Problem List   Diagnosis Date Noted   S/P AAA repair 02/22/2018    Priority: High   History of basal cell carcinoma of skin 12/10/2015    Priority: Medium    Hyperlipidemia 03/12/2012    Priority: Medium    AAA (abdominal aortic aneurysm) 03/12/2012    Priority: Medium    Macular degeneration 03/18/2019    Priority: Low   Vertigo 05/29/2016    Priority: Low   Colon cancer screening 04/21/2016    Priority: Low   Seasonal allergies     Priority: Low   Chest pain 03/12/2012    Priority: Low   Lumbosacral radiculitis 09/04/2017    Priority: 1.   Low back pain 04/21/2017    Priority: 1.   Type II endoleak of aortic graft 03/07/2020    Medications- reviewed and updated Current Outpatient Medications  Medication Sig Dispense Refill   amoxicillin -clavulanate (AUGMENTIN ) 875-125 MG tablet Take 1 tablet by mouth 2 (two) times daily for 10 days. 20 tablet 0   atorvastatin  (LIPITOR) 20 MG tablet TAKE 1 TABLET BY MOUTH EVERY DAY 90 tablet 3   Cholecalciferol (VITAMIN D3) 50 MCG (2000 UT) capsule Take by mouth.     fluticasone  (FLONASE ) 50 MCG/ACT nasal spray Place into both nostrils.     Sulfacetamide  Sodium-Sulfur  9-4.5 % LIQD SMARTSIG:sparingly Topical Daily     Multiple Vitamins-Minerals (ICAPS AREDS 2 PO) Take by mouth. (Patient not taking: Reported on 02/05/2024)     No current facility-administered medications for this visit.      Objective:  BP 126/74 (BP Location: Left Arm, Patient Position: Sitting, Cuff Size: Normal)   Pulse 68   Temp (!) 97.2 F (36.2 C) (Temporal)   Ht 5' 10 (1.778 m)   Wt 158 lb 12.8 oz (72 kg)   SpO2 97%   BMI 22.79 kg/m  Gen: NAD, resting comfortably Nasal turbinates edematous and erythematous with mixture of clear and yellow discharge.  Frontal sinuses nontender.  Painful bilaterally maxillary sinuses CV: RRR no murmurs rubs or gallops Lungs: CTAB no crackles, wheeze, rhonchi Ext: no edema Skin: warm, dry     Assessment and Plan   # Nasal congestion/pressure S: Patient reports nearly a month of symptoms.  Denies headache or cough other than frontal sinus pressure- almost never takes tylenol .  Has had ongoing sore throat.  Denies significant postnasal drip.no wheeze or shortness of breath- still able to walk his 3 miles in for most part.   He had been seen by a PA about 2 weeks ago at [facility] and was placed on amoxicillin  for 7 days and just recently started on Flonase  twice daily but has stopped about a week ago.  Has tried over the counter medications which have not been helpful. Has tried cloraseptic spray and that helps some. Also does cepacol sore throat lozenges  He's generally remained active outside of on Tuesday of this  week when didn't do his normal 3 mile walk or his Bible Study that day- perhaps was his worst day. Starting to improve some from them. Always worse in the morning- does at least rest well. Hoarse in morning and improves as day goes on.   Years ago saw Dr. Floy. Has had similar symptom(s) in past and did better with antibiotic(s) than he did this initial time A/P:  with a month of sinus congestion and sinus tenderness on exam today and with minimal improvement on amocillin I'm concnered this could be inadequately treated bacterial sinusitis. We are going to use more aggressive Augmentin  for 10 days and I want you to update me Monday after next if not  significantly improved (just message me). I may use an alternative antibiotic(s) vs extending course. Please take this with food because can be tougher on stomach.  -Consider doxycycline as backup.  With aneurysm history would avoid quinolones  Recommended follow up: Return for as needed for new, worsening, persistent symptoms. Future Appointments  Date Time Provider Department Center  02/22/2024 11:20 AM [facility] 2 [facility]  03/11/2024 11:20 AM [name] RAMAN, MD [facility]  09/19/2024 11:00 AM Katrinka Garnette KIDD, MD [facility]  11/15/2024 10:00 AM [facility] ANNUAL WELLNESS VISIT 1 [facility]    Lab/Order associations:   ICD-10-CM   1. Bacterial sinusitis  J32.9    B96.89       Meds ordered this encounter  Medications   amoxicillin -clavulanate (AUGMENTIN ) 875-125 MG tablet    Sig: Take 1 tablet by mouth 2 (two) times daily for 10 days.    Dispense:  20 tablet    Refill:  0    Return precautions advised.  Garnette Katrinka, MD

## 2024-02-05 NOTE — Patient Instructions (Addendum)
 with a month of sinus congestion and sinus tenderness on exam today and with minimal improvement on amocillin I'm concnered this could be inadequately treated bacterial sinusitis. We are going to use more aggressive Augmentin  for 10 days and I want you to update me Monday after next if not significantly improved (just message me). I may use an alternative antibiotic(s) vs extending course. Please take this with food because can be tougher on stomach.   Recommended follow up: Return for as needed for new, worsening, persistent symptoms.

## 2024-02-15 ENCOUNTER — Encounter: Payer: Self-pay | Admitting: Family Medicine

## 2024-02-22 ENCOUNTER — Inpatient Hospital Stay: Admission: RE | Admit: 2024-02-22 | Discharge: 2024-02-22 | Attending: Vascular Surgery

## 2024-02-22 DIAGNOSIS — I7143 Infrarenal abdominal aortic aneurysm, without rupture: Secondary | ICD-10-CM

## 2024-02-22 MED ORDER — IOPAMIDOL (ISOVUE-370) INJECTION 76%
100.0000 mL | Freq: Once | INTRAVENOUS | Status: AC | PRN
  Administered 2024-02-22: 100 mL via INTRAVENOUS

## 2024-03-04 ENCOUNTER — Encounter: Payer: Self-pay | Admitting: Family Medicine

## 2024-03-04 ENCOUNTER — Ambulatory Visit: Admitting: Family Medicine

## 2024-03-04 ENCOUNTER — Ambulatory Visit: Payer: Self-pay | Admitting: Family Medicine

## 2024-03-04 VITALS — BP 136/84 | HR 61 | Temp 97.3°F | Ht 70.0 in | Wt 155.0 lb

## 2024-03-04 DIAGNOSIS — J019 Acute sinusitis, unspecified: Secondary | ICD-10-CM | POA: Diagnosis not present

## 2024-03-04 DIAGNOSIS — R0981 Nasal congestion: Secondary | ICD-10-CM

## 2024-03-04 DIAGNOSIS — B9689 Other specified bacterial agents as the cause of diseases classified elsewhere: Secondary | ICD-10-CM

## 2024-03-04 LAB — POC COVID19 BINAXNOW: SARS Coronavirus 2 Ag: NEGATIVE

## 2024-03-04 LAB — POCT INFLUENZA A/B
Influenza A, POC: NEGATIVE
Influenza B, POC: NEGATIVE

## 2024-03-04 MED ORDER — CEFDINIR 300 MG PO CAPS: 300.0000 mg | ORAL_CAPSULE | Freq: Two times a day (BID) | ORAL | 0 refills | Status: DC

## 2024-03-04 NOTE — Telephone Encounter (Signed)
 FYI Only or Action Required?: Action required by provider: No availability, refusing UC, needs call back asap, requesting appt and/or meds.  Patient was last seen in primary care on 02/05/2024 by Katrinka Garnette KIDD, MD.  Called Nurse Triage reporting Sinusitis.  Symptoms began weeks ago, improved shortly then returned.  Interventions attempted: Prescription medications: antibx and Rest, hydration, or home remedies.  Symptoms are: gradually worsening.  Triage Disposition: See Physician Within 24 Hours (overriding See PCP When Office is Open (Within 3 Days))  Patient/caregiver understands and will follow disposition?: No, wishes to speak with PCP      Reason for Disposition  [1] Sinus congestion (pressure, fullness) AND [2] present > 10 days  Answer Assessment - Initial Assessment Questions Pt requesting meds be sent in for symptoms. This RN recommended pt be examined today for symptoms since symptoms returned after antibx, no availability with offices in region today, advised UC for pt wife and him, advised ED if SOB or chest pain. Pt husband refusing UC at this time, requesting appt with an office and/or meds be sent to [facility] on [address] in [place].    Pt calling in primarily for wife, he is also having symptoms but states his are less severe Sneezing sneezing sneezing Sinusitis a month ago Improved with antibx 10 day course but symptoms have come back No chest pain All in head and throat No SOB Maybe slightly weaker than usual Usually take a walk each day, don't know that I'll be able to do that today [facility] [address] in [place] We won't go to UC  Protocols used: Sinus Pain or Congestion-A-AH

## 2024-03-04 NOTE — Progress Notes (Signed)
 "  Subjective:     Patient ID: Charles Stephenson, male    DOB: 10-09-1934, 89 y.o.   MRN: [id number]  Chief Complaint  Patient presents with   Nasal Congestion   Sinus Problem    Was treated in early December. Was exposed to sickness over Christmas. Also wife is sick. His symptoms started 3 days ago. No fever.     Discussed the use of AI scribe software for clinical note transcription with the patient, who gave verbal consent to proceed.  History of Present Illness Charles Stephenson is an 89 year old male who presents with recurrent upper respiratory symptoms.  He has been experiencing upper respiratory symptoms for over a month, initially presenting with rhinorrhea, nasal congestion, and pharyngitis. A 10-day course of antibiotics, nasal sprays, and other medications resolved the pharyngitis and improved his symptoms.  Recently, after his son visited for Christmas with a cold, his wife developed congestion, mucus production, and severe coughing, leading to weakness and decreased appetite. He then began experiencing similar symptoms, including sneezing and significant nasal congestion, but without coughing.  No fever, myalgia, or influenza-like symptoms are present, and he maintains his usual level of physical activity, including walking three miles daily. He has been using a humidifier and took two Tylenol  recently, which is unusual for him.  He has a history of basal cell carcinoma on his nose, for which he has undergone surgery twice. He also mentions a past episode of COVID-19 in 2022 and a similar respiratory condition about seven or eight years ago.  He has used nasal sprays and has taken Tylenol . He took one Sudafed yesterday but does not plan to take more.    There are no preventive care reminders to display for this patient.  Past Medical History:  Diagnosis Date   AAA (abdominal aortic aneurysm)    Allergy    Arthritis    Basal cell carcinoma    02/09/2020:  patient denies   Diverticulitis    3x through 18 months around 2012   Hepatitis    02/29/2020: patient denies hx   History of alcohol abuse    sober since age 64   History of vertigo    december 2007    Hypercholesterolemia    Inguinal hernia 2005   left side per patient   Seasonal allergies    no rx    Past Surgical History:  Procedure Laterality Date   ABDOMINAL AORTIC ENDOVASCULAR STENT GRAFT  04/13/2017   w/EMBOLIZATION LEFT HYPOGASTRIC USING INTERLOCK-35 DETACHABLE COIL/notes 04/13/2017   ABDOMINAL AORTIC ENDOVASCULAR STENT GRAFT N/A 04/13/2017   Procedure: ABDOMINAL AORTIC ENDOVASCULAR STENT GRAFT;  Surgeon: Oris Krystal FALCON, MD;  Location: [facility] OR;  Service: Vascular;  Laterality: N/A;   AORTOGRAM N/A 03/07/2020   Procedure: AORTOGRAM with insertion of  Aortic Cuff;  Surgeon: Oris Krystal FALCON, MD;  Location: [facility] OR;  Service: Vascular;  Laterality: N/A;   Aortogram and placement of proximal aortic cuff  03/07/2020   BACK SURGERY     BASAL CELL CARCINOMA EXCISION  X~ 9   chest, back, left leg (04/13/2017)   CARDIOVASCULAR STRESS TEST  03/16/2012   CATARACT EXTRACTION W/ INTRAOCULAR LENS  IMPLANT, BILATERAL Bilateral    L pupil larger after lens implant   COLONOSCOPY     EMBOLIZATION N/A 04/13/2017   Procedure: EMBOLIZATION LEFT HYPOGASTRIC USING INTERLOCK-35 DETACHABLE COIL;  Surgeon: Oris Krystal FALCON, MD;  Location: [facility];  Service: Vascular;  Laterality: N/A;   HERNIA  REPAIR Right 08/2020   INGUINAL HERNIA REPAIR Right 2005   MOHS SURGERY  09/2014; ~ 2014   BCC; nose and left ear   POSTERIOR FUSION LUMBAR SPINE  10/15/1982   the 2 lowest discs fused   SMALL INTESTINE SURGERY     ULTRASOUND GUIDANCE FOR VASCULAR ACCESS Right 03/07/2020   Procedure: ULTRASOUND GUIDANCE FOR VASCULAR ACCESS, right femoral artery;  Surgeon: Oris Krystal FALCON, MD;  Location: [facility];  Service: Vascular;  Laterality: Right;    Current Medications[1]  Allergies[2] ROS neg/noncontributory except as  noted HPI/below      Objective:     BP 136/84 (BP Location: Left Arm, Patient Position: Sitting, Cuff Size: Normal)   Pulse 61   Temp (!) 97.3 F (36.3 C) (Temporal)   Ht 5' 10 (1.778 m)   Wt 155 lb (70.3 kg)   SpO2 97%   BMI 22.24 kg/m  Wt Readings from Last 3 Encounters:  03/04/24 155 lb (70.3 kg)  02/05/24 158 lb 12.8 oz (72 kg)  11/11/23 152 lb (68.9 kg)    Physical Exam GENERAL: Well developed, well nourished, no acute distress. HEAD EYES EARS NOSE THROAT: Normocephalic, atraumatic, conjunctiva not injected, sclera nonicteric. Tympanic membranes gray with good landmarks bilaterally. Oropharynx clear, moist without exudates. Sinuses non-tender to percussion. CARDIAC: Regular rate and rhythm, S1 S2 present, . NECK: Supple, no thyromegaly, no lymphadenopathy, LUNGS: Clear to auscultation bilaterally, no wheezes. EXTREMITIES: No edema. MUSCULOSKELETAL: No gross abnormalities. NEUROLOGICAL: Alert and oriented x3, cranial nerves II through XII intact. PSYCHIATRIC: Normal mood, good eye contact.  Results for orders placed or performed in visit on 03/04/24  POC COVID-19 BinaxNow   Collection Time: 03/04/24 10:20 AM  Result Value Ref Range   SARS Coronavirus 2 Ag Negative Negative  POCT Influenza A/B   Collection Time: 03/04/24 10:20 AM  Result Value Ref Range   Influenza A, POC Negative Negative   Influenza B, POC Negative Negative          Assessment & Plan:  Nasal congestion -     POC COVID-19 BinaxNow -     POCT Influenza A/B  Bacterial sinusitis  Other orders -     Cefdinir; Take 1 capsule (300 mg total) by mouth 2 (two) times daily.  Dispense: 14 capsule; Refill: 0    Assessment and Plan Assessment & Plan Acute sinusitis   Symptoms of rhinorrhea, congestion, and sneezing suggest a viral infection, supported by negative COVID and flu tests. No fever or myalgia present. Previous sinusitis was treated with Augmentin . Prescribe Omnicef for 7 days. Advise  rest, increased fluid intake, and nasal spray twice daily if beneficial. Avoid Sudafed due to potential hypertension and prostate spasm. Use Tylenol  for headache or discomfort and Mucinex  for mucus relief. Recommend isolation to prevent spread.     Return if symptoms worsen or fail to improve.  Jenkins CHRISTELLA Carrel, MD     [1]  Current Outpatient Medications:    atorvastatin  (LIPITOR) 20 MG tablet, TAKE 1 TABLET BY MOUTH EVERY DAY, Disp: 90 tablet, Rfl: 3   cefdinir (OMNICEF) 300 MG capsule, Take 1 capsule (300 mg total) by mouth 2 (two) times daily., Disp: 14 capsule, Rfl: 0   Cholecalciferol (VITAMIN D3) 50 MCG (2000 UT) capsule, Take by mouth., Disp: , Rfl:    fluticasone  (FLONASE ) 50 MCG/ACT nasal spray, Place into both nostrils., Disp: , Rfl:    Sulfacetamide  Sodium-Sulfur  9-4.5 % LIQD, SMARTSIG:sparingly Topical Daily, Disp: , Rfl:  [2] No Known Allergies  "

## 2024-03-04 NOTE — Patient Instructions (Addendum)
 It was very nice to see you today!  Sent omnicef  Don't take sudafed Tylenol  ok   PLEASE NOTE:  If you had any lab tests please let us  know if you have not heard back within a few days. You may see your results on MyChart before we have a chance to review them but we will give you a call once they are reviewed by us . If we ordered any referrals today, please let us  know if you have not heard from their office within the next week.   Please try these tips to maintain a healthy lifestyle:  Eat most of your calories during the day when you are active. Eliminate processed foods including packaged sweets (pies, cakes, cookies), reduce intake of potatoes, white bread, white pasta, and white rice. Look for whole grain options, oat flour or almond flour.  Each meal should contain half fruits/vegetables, one quarter protein, and one quarter carbs (no bigger than a computer mouse).  Cut down on sweet beverages. This includes juice, soda, and sweet tea. Also watch fruit intake, though this is a healthier sweet option, it still contains natural sugar! Limit to 3 servings daily.  Drink at least 1 glass of water with each meal and aim for at least 8 glasses per day  Exercise at least 150 minutes every week.

## 2024-03-10 NOTE — Progress Notes (Signed)
 "  Patient ID: Charles Stephenson, male   DOB: 1934/11/30, 89 y.o.   MRN: [id number]  Reason for Consult: No chief complaint on file.   Referred by Katrinka Garnette KIDD, MD  Subjective:     HPI  Charles Stephenson is a 89 y.o. male presenting for follow-up.  He underwent EVAR for abdominal aortic aneurysm in February 2019.  He then subsequently underwent aortic cuff placement for a type Ia endoleak in January 2022.  He denies any new or unusual abdominal or back pain.  He continues to exercise 5 times a week and denies any other issues.  He is compliant with atorvastatin .  Past Medical History:  Diagnosis Date   AAA (abdominal aortic aneurysm)    Allergy    Arthritis    Basal cell carcinoma    02/09/2020: patient denies   Diverticulitis    3x through 18 months around 2012   Hepatitis    02/29/2020: patient denies hx   History of alcohol abuse    sober since age 40   History of vertigo    december 2007    Hypercholesterolemia    Inguinal hernia 2005   left side per patient   Seasonal allergies    no rx   Family History  Problem Relation Age of Onset   AAA (abdominal aortic aneurysm) Father        sounds like died of DIC- otherwise healthy   Peripheral vascular disease Father        Aneurysm- Aorta   AAA (abdominal aortic aneurysm) Brother        states multiple medicle issues but died of pulmonary fibrosis- was told smoking related   Heart disease Brother 68       Before age 9   Heart attack Brother        x2   Alcohol abuse Brother        sober 25 years when he died   Prostate cancer Brother    COPD Brother    Cancer Mother        Stomach   Past Surgical History:  Procedure Laterality Date   ABDOMINAL AORTIC ENDOVASCULAR STENT GRAFT  04/13/2017   w/EMBOLIZATION LEFT HYPOGASTRIC USING INTERLOCK-35 DETACHABLE COIL/notes 04/13/2017   ABDOMINAL AORTIC ENDOVASCULAR STENT GRAFT N/A 04/13/2017   Procedure: ABDOMINAL AORTIC ENDOVASCULAR STENT GRAFT;  Surgeon: Oris Krystal FALCON, MD;  Location: [facility];  Service: Vascular;  Laterality: N/A;   AORTOGRAM N/A 03/07/2020   Procedure: AORTOGRAM with insertion of  Aortic Cuff;  Surgeon: Oris Krystal FALCON, MD;  Location: [facility];  Service: Vascular;  Laterality: N/A;   Aortogram and placement of proximal aortic cuff  03/07/2020   BACK SURGERY     BASAL CELL CARCINOMA EXCISION  X~ 9   chest, back, left leg (04/13/2017)   CARDIOVASCULAR STRESS TEST  03/16/2012   CATARACT EXTRACTION W/ INTRAOCULAR LENS  IMPLANT, BILATERAL Bilateral    L pupil larger after lens implant   COLONOSCOPY     EMBOLIZATION N/A 04/13/2017   Procedure: EMBOLIZATION LEFT HYPOGASTRIC USING INTERLOCK-35 DETACHABLE COIL;  Surgeon: Oris Krystal FALCON, MD;  Location: [facility];  Service: Vascular;  Laterality: N/A;   HERNIA REPAIR Right 08/2020   INGUINAL HERNIA REPAIR Right 2005   MOHS SURGERY  09/2014; ~ 2014   BCC; nose and left ear   POSTERIOR FUSION LUMBAR SPINE  10/15/1982   the 2 lowest discs fused   SMALL INTESTINE SURGERY     ULTRASOUND GUIDANCE  FOR VASCULAR ACCESS Right 03/07/2020   Procedure: ULTRASOUND GUIDANCE FOR VASCULAR ACCESS, right femoral artery;  Surgeon: Oris Krystal FALCON, MD;  Location: [facility];  Service: Vascular;  Laterality: Right;    Short Social History:  Social History   Tobacco Use   Smoking status: Never   Smokeless tobacco: Never  Substance Use Topics   Alcohol use: Not Currently    Comment: 04/13/2017 recovering alcoholic; 114/1979    No Known Allergies  Current Outpatient Medications  Medication Sig Dispense Refill   atorvastatin  (LIPITOR) 20 MG tablet TAKE 1 TABLET BY MOUTH EVERY DAY 90 tablet 3   cefdinir  (OMNICEF ) 300 MG capsule Take 1 capsule (300 mg total) by mouth 2 (two) times daily. 14 capsule 0   Cholecalciferol (VITAMIN D3) 50 MCG (2000 UT) capsule Take by mouth.     fluticasone  (FLONASE ) 50 MCG/ACT nasal spray Place into both nostrils.     Sulfacetamide  Sodium-Sulfur  9-4.5 % LIQD SMARTSIG:sparingly Topical  Daily     No current facility-administered medications for this visit.    REVIEW OF SYSTEMS  Negative other than noted in HPI     Objective:  Objective   There were no vitals filed for this visit.  There is no height or weight on file to calculate BMI.  Physical Exam General: no acute distress Cardiac: hemodynamically stable, nontachycardic Pulm: normal work of breathing GI: non-tender, no pulsatile mass Neuro: alert, no focal deficit Extremities: no edema, cyanosis or wounds Vascular:   Right: Palpable femoral  Left: Palpable femoral   Data: CT abdomen pelvis independently reviewed EVAR with persistent type 2 endoleak with about 3-5mm of growth from last year See measurements below, most recent scan on RIGHT      Assessment/Plan:     [name] is a 89 y.o. male who is status post EVAR in 2019 followed by proximal aortic cuff placement in 2022 found to have a slowly enlarging aneurysm with a type II endoleak likely from lumbar artery. Over the last 12 months it has grown another 3-75mm. See above measurements. If he continues to have growth I worry that we will lose proximal seal and develop a 1A endoleak.  Therefore we will plan to refer to IR for type II endoleak evaluation and possible embolization.     Norman GORMAN Serve MD [facility]  "

## 2024-03-11 ENCOUNTER — Encounter: Payer: Self-pay | Admitting: Vascular Surgery

## 2024-03-11 ENCOUNTER — Ambulatory Visit: Attending: Vascular Surgery | Admitting: Vascular Surgery

## 2024-03-11 VITALS — BP 143/77 | HR 57 | Temp 97.8°F | Resp 18 | Ht 70.0 in | Wt 155.0 lb

## 2024-03-11 DIAGNOSIS — I7143 Infrarenal abdominal aortic aneurysm, without rupture: Secondary | ICD-10-CM

## 2024-03-18 ENCOUNTER — Other Ambulatory Visit: Payer: Self-pay | Admitting: Vascular Surgery

## 2024-03-18 DIAGNOSIS — I9789 Other postprocedural complications and disorders of the circulatory system, not elsewhere classified: Secondary | ICD-10-CM

## 2024-04-01 ENCOUNTER — Ambulatory Visit
Admission: RE | Admit: 2024-04-01 | Discharge: 2024-04-01 | Disposition: A | Source: Ambulatory Visit | Attending: Vascular Surgery

## 2024-04-01 DIAGNOSIS — I9789 Other postprocedural complications and disorders of the circulatory system, not elsewhere classified: Secondary | ICD-10-CM

## 2024-04-01 NOTE — Consult Note (Signed)
 "      Chief Complaint: Patient was seen in consultation today for endoleak and enlarging AAA  at the request of Buckley,Tyler S  Referring Physician(s): Buckley,Tyler S  History of Present Illness: Charles Stephenson is a 89 y.o. male  status post EVAR in 2019 followed by proximal aortic cuff placement in 2022 found to have a slowly enlarging aneurysm with a type II endoleak likely from lumbar artery. Over the last 12 months it has grown another 3-34mm. Consult requested  for type II endoleak evaluation and possible embolization.  Past Medical History:  Diagnosis Date   AAA (abdominal aortic aneurysm)    Allergy    Arthritis    Basal cell carcinoma    02/09/2020: patient denies   Diverticulitis    3x through 18 months around 2012   Hepatitis    02/29/2020: patient denies hx   History of alcohol abuse    sober since age 82   History of vertigo    december 2007    Hypercholesterolemia    Inguinal hernia 2005   left side per patient   Seasonal allergies    no rx    Past Surgical History:  Procedure Laterality Date   ABDOMINAL AORTIC ENDOVASCULAR STENT GRAFT  04/13/2017   w/EMBOLIZATION LEFT HYPOGASTRIC USING INTERLOCK-35 DETACHABLE COIL/notes 04/13/2017   ABDOMINAL AORTIC ENDOVASCULAR STENT GRAFT N/A 04/13/2017   Procedure: ABDOMINAL AORTIC ENDOVASCULAR STENT GRAFT;  Surgeon: Oris Krystal FALCON, MD;  Location: [facility] OR;  Service: Vascular;  Laterality: N/A;   AORTOGRAM N/A 03/07/2020   Procedure: AORTOGRAM with insertion of  Aortic Cuff;  Surgeon: Oris Krystal FALCON, MD;  Location: [facility] OR;  Service: Vascular;  Laterality: N/A;   Aortogram and placement of proximal aortic cuff  03/07/2020   BACK SURGERY     BASAL CELL CARCINOMA EXCISION  X~ 9   chest, back, left leg (04/13/2017)   CARDIOVASCULAR STRESS TEST  03/16/2012   CATARACT EXTRACTION W/ INTRAOCULAR LENS  IMPLANT, BILATERAL Bilateral    L pupil larger after lens implant   COLONOSCOPY     EMBOLIZATION N/A 04/13/2017    Procedure: EMBOLIZATION LEFT HYPOGASTRIC USING INTERLOCK-35 DETACHABLE COIL;  Surgeon: Oris Krystal FALCON, MD;  Location: [facility] OR;  Service: Vascular;  Laterality: N/A;   HERNIA REPAIR Right 08/2020   INGUINAL HERNIA REPAIR Right 2005   MOHS SURGERY  09/2014; ~ 2014   BCC; nose and left ear   POSTERIOR FUSION LUMBAR SPINE  10/15/1982   the 2 lowest discs fused   SMALL INTESTINE SURGERY     ULTRASOUND GUIDANCE FOR VASCULAR ACCESS Right 03/07/2020   Procedure: ULTRASOUND GUIDANCE FOR VASCULAR ACCESS, right femoral artery;  Surgeon: Oris Krystal FALCON, MD;  Location: [facility] OR;  Service: Vascular;  Laterality: Right;    Allergies: Patient has no known allergies.  Medications: Prior to Admission medications  Medication Sig Start Date End Date Taking? Authorizing Provider  atorvastatin  (LIPITOR) 20 MG tablet TAKE 1 TABLET BY MOUTH EVERY DAY 08/28/23   Katrinka Garnette KIDD, MD  Cholecalciferol (VITAMIN D3) 50 MCG (2000 UT) capsule Take by mouth. 05/18/19   [provider]  fluticasone  (FLONASE ) 50 MCG/ACT nasal spray Place into both nostrils. 01/21/24   [provider]  Sulfacetamide  Sodium-Sulfur  9-4.5 % LIQD SMARTSIG:sparingly Topical Daily 01/21/24   [provider]     Family History  Problem Relation Age of Onset   AAA (abdominal aortic aneurysm) Father        sounds like died of DIC-  otherwise healthy   Peripheral vascular disease Father        Aneurysm- Aorta   AAA (abdominal aortic aneurysm) Brother        states multiple medicle issues but died of pulmonary fibrosis- was told smoking related   Heart disease Brother 22       Before age 76   Heart attack Brother        x2   Alcohol abuse Brother        sober 25 years when he died   Prostate cancer Brother    COPD Brother    Cancer Mother        Stomach    Social History   Socioeconomic History   Marital status: Married    Spouse name: Not on file   Number of children: 2   Years of education: Not on file    Highest education level: Some college, no degree  Occupational History   Occupation: Retired    Comment: Psychologist, 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 2 kids- 1 local. No grandkids and wont have them. No pets.       Retired in 2008 from [employer] to [employer]: golfing 2-3 days a week, down to place in [place]- walk at [employer], walks 2.5 miles daily, service work      Lives in [place]    Social Drivers of Health   Tobacco Use: Low Risk (03/11/2024)   Patient History    Smoking Tobacco Use: Never    Smokeless Tobacco Use: Never    Passive Exposure: Not on file  Financial Resource Strain: Low Risk (02/05/2024)   Overall Financial Resource Strain (CARDIA)    Difficulty of Paying Living Expenses: Not hard at all  Food Insecurity: No Food Insecurity (02/05/2024)   Epic    Worried About Programme Researcher, Broadcasting/film/video in the Last Year: Never true    Ran Out of Food in the Last Year: Never true  Transportation Needs: No Transportation Needs (02/05/2024)   Epic    Lack of Transportation (Medical): No    Lack of Transportation (Non-Medical): No  Physical Activity: Sufficiently Active (02/05/2024)   Exercise Vital Sign    Days of Exercise per Week: 7 days    Minutes of Exercise per Session: 90 min  Stress: No Stress Concern Present (02/05/2024)   [employer] of Occupational Health - Occupational Stress Questionnaire    Feeling of Stress: Not at all  Social Connections: Socially Integrated (02/05/2024)   Social Connection and Isolation Panel    Frequency of  Communication with Friends and Family: More than three times a week    Frequency of Social Gatherings with Friends and Family: More than three times a week    Attends Religious Services: More than 4 times per year    Active Member of [employer] or Organizations: Yes    Attends Banker Meetings: More than 4 times per year    Marital Status: Married  Depression (PHQ2-9):  Low Risk (11/11/2023)   Depression (PHQ2-9)    PHQ-2 Score: 0  Alcohol Screen: Not on file  Housing: Unknown (02/05/2024)   Epic    Unable to Pay for Housing in the Last Year: No    Number of Times Moved in the Last Year: Not on file    Homeless in the Last Year: No  Utilities: Not At Risk (11/11/2023)   Epic    Threatened with loss of utilities: No  Health Literacy: Adequate Health Literacy (11/11/2023)   B1300 Health Literacy    Frequency of need for help with medical instructions: Never    ECOG Status: 0 - Asymptomatic  Review of Systems: A 12 point ROS discussed and pertinent positives are indicated in the HPI above.  All other systems are negative.    Vital Signs: BP (!) 156/80 (BP Location: Left Arm, Patient Position: Sitting, Cuff Size: Normal)   Pulse (!) 57   Temp (!) 97.4 F (36.3 C) (Oral)   Resp 18   SpO2 97%      Physical Exam  Constitutional: Oriented to person, place, and time. Well-developed and well-nourished. No distress.   HENT:  Head: Normocephalic and atraumatic.  Eyes: Conjunctivae and EOM are normal. Right eye exhibits no discharge. Left eye exhibits no discharge. No scleral icterus.  Neck: No JVD present.  Pulmonary/Chest: Effort normal. No stridor. No respiratory distress.  Abdomen: soft, non distended Neurological:  alert and oriented to person, place, and time.  Skin: Skin is warm and dry.  not diaphoretic.  Psychiatric:   normal mood and affect.   behavior is normal. Judgment and thought content normal.   Review of Systems  Vital Signs: BP (!) 156/80 (BP Location:  Left Arm, Patient Position: Sitting, Cuff Size: Normal)   Pulse (!) 57   Temp (!) 97.4 F (36.3 C) (Oral)   Resp 18   SpO2 97%       Imaging: CT ANGIOGRAPHY ABDOMEN AND PELVIS WITH CONTRAST AND WITHOUT CONTRAST   TECHNIQUE: Multidetector CT imaging of the abdomen and pelvis was performed using the standard protocol during bolus administration of intravenous contrast. Multiplanar reconstructed images and MIPs were obtained and reviewed to evaluate the vascular anatomy.   RADIATION DOSE REDUCTION: This exam was performed according to the departmental dose-optimization program which includes automated exposure control, adjustment of the mA and/or kV according to patient size and/or use of iterative reconstruction technique.   CONTRAST:  ISOVUE -370 IOPAMIDOL  (ISOVUE -370) INJECTION 76%   COMPARISON:  01/01/2022, 01/05/2023   FINDINGS: VASCULAR   Aorta: Similar appearing postsurgical changes after aorto bi-iliac endograft repair of infrarenal abdominal aortic aneurysm. There is persistent endoleak which appears to arise from the superior aspect of the aneurysm sac, likely the right L3 lumbar artery. The aneurysm sac measures up to 7.1 cm in maximum short axis dimension, previously 6.3 cm at this location on most recent comparison. The native suprarenal abdominal aorta is normal in caliber and patent with scattered atherosclerotic calcifications.   Celiac: Mild ostial stenosis secondary to calcified atherosclerotic plaque and mild median arcuate ligament compression.   SMA: Mild ostial stenosis secondary to atherosclerotic plaque. Patent distally.   Renals: Single bilateral renal arteries with mild ostial stenosis secondary to atherosclerotic plaque.   IMA: Occluded proximally with distal reconstitution.   Inflow: The right iliac limb extends to the distal common iliac artery. The left iliac limb extends through the proximal external iliac artery. The left internal  iliac artery remains occluded. There is a  successfully occluded/thrombosed aneurysm about the left internal iliac artery which measures up to approximately 4.5 cm, unchanged from comparison. There is a persistent lobulated fusiform aneurysm of the right internal iliac artery which measures up to 2.4 cm in diameter, unchanged. The bilateral uncovered external iliac arteries are patent.   Proximal Outflow: Bilateral common femoral and visualized portions of the superficial and profunda femoral arteries are patent without evidence of aneurysm, dissection, vasculitis or significant stenosis.   Veins: No obvious venous abnormality within the limitations of this arterial phase study.   Review of the MIP images confirms the above findings.   NON-VASCULAR   Lower chest: No acute abnormality.   Hepatobiliary: Unchanged simple hepatic cyst in the left lobe measuring up to 1.9 cm. The liver is otherwise normal in appearance. No gallstones, gallbladder wall thickening, or biliary dilatation.   Pancreas: Unremarkable. No pancreatic ductal dilatation or surrounding inflammatory changes.   Spleen: Normal in size without focal abnormality.   Adrenals/Urinary Tract: Adrenal glands are unremarkable. Unchanged left parapelvic simple appearing cysts. There are few small scattered right simple cortical cysts, not requiring additional follow-up. Kidneys are otherwise normal, without renal calculi, focal lesion, or hydronephrosis. Bladder is unremarkable.   Stomach/Bowel: Stomach is within normal limits. Appendix is not definitively identified. Diffuse sigmoid colonic diverticuli. No evidence of bowel wall thickening, distention, or inflammatory changes.   Lymphatic: No abdominopelvic lymphadenopathy.   Reproductive: Prostate is unremarkable.   Other: No abdominal wall hernia or abnormality. No abdominopelvic ascites.   Musculoskeletal: No acute osseous abnormality. Similar appearing mild  multilevel degenerative changes of the visualized thoracolumbar spine.   IMPRESSION: VASCULAR   1. Persistent likely type 2 endoleak with interval infrarenal abdominal aortic aneurysm sac enlargement from 6.3 to 7.1 cm. 2. Unchanged appearance of right internal iliac fusiform aneurysm measuring up to 2.4 cm and thrombosed, excluded left internal iliac aneurysm measuring up to 4.5 cm. 3.  Aortic Atherosclerosis (ICD10-I70.0).   NON-VASCULAR   1. No acute abdominopelvic abnormality. 2. Similar appearing sigmoid diverticulosis.   Ester Sides, MD    Labs:  CBC: Recent Labs    09/16/23 1138  WBC 5.1  HGB 13.7  HCT 41.2  PLT 167.0    COAGS: No results for input(s): INR, APTT in the last 8760 hours.  BMP: Recent Labs    09/16/23 1138  NA 137  K 4.4  CL 99  CO2 32  GLUCOSE 88  BUN 17  CALCIUM  9.5  CREATININE 0.84    LIVER FUNCTION TESTS: Recent Labs    09/16/23 1138  BILITOT 1.0  AST 24  ALT 19  ALKPHOS 69  PROT 7.1  ALBUMIN 4.5    TUMOR MARKERS: No results for input(s): AFPTM, CEA, CA199, CHROMGRNA in the last 8760 hours.  Assessment and Plan:  89 year old male status post EVAR (2019) with proximal aortic cuff (2022) with persistent type II endoleak, most consistent with retrograde lumbar artery inflow, and interval aneurysm sac enlargement on CTA from 6.3 cm to 7.1 cm . Given continued sac expansion, this endoleak is clinically significant and treatment is indicated to reduce risk of  rupture.  Endovascular (transarterial) access to the suspected upper lumbar source is anticipated to be technically difficult; therefore, direct aneurysm sac access and embolization is favored. Plan to Proceed with endoleak embolization given documented aneurysm sac enlargement. Translumbar, transdiscar, or anterior direct aneurysm sac puncture with intent to embolize the endoleak nidus (likely lumbar source). Procedure planned with cone-beam CT guidance for  trajectory planning and  confirmation of access to the endoleak cavity. Embolic strategy liquid embolic agent ( Onyx)  adjunctive coils as needed to achieve durable occlusion of the endoleak cavity and communicating channels. Risks discussed with patient and spouse include bleeding/hematoma, infection, injury to bowel or other abdominal organs (higher with anterior access), arterial or graft injury, non-target embolization (including spinal/nerve ischemia), contrast reaction, kidney injury, need for repeat embolization, persistent/recurrent endoleak, and continued aneurysm growth despite treatment. Benefit  is to stop the endoleak and stabilize aneurysm sac size, reducing risk of further enlargement and future aneurysm-related complications e.g. rupture. Alternatives include continued imaging surveillance (less favored given growth) and surgical options (higher risk in this age group). Follow-up post-procedure observation per protocol; obtain surveillance CTA to assess endoleak resolution and sac size trend.  Thank you for this interesting consult.  I greatly enjoyed meeting Charles Stephenson and look forward to participating in their care.  A copy of this report was sent to the requesting provider on this date.  Electronically Signed: Dayne Kera Deacon 04/01/2024, 3:53 PM   I spent a total of  40 Minutes   in face to face in clinical consultation, greater than 50% of which was counseling/coordinating care for enlarging AAA post stent graft, with endoleak.    "

## 2024-04-07 ENCOUNTER — Telehealth (HOSPITAL_COMMUNITY): Payer: Self-pay

## 2024-04-07 NOTE — Telephone Encounter (Signed)
 LMOM Charles Stephenson is working on order checking Dr. Johann schedule.

## 2024-09-19 ENCOUNTER — Encounter: Admitting: Family Medicine

## 2024-11-15 ENCOUNTER — Ambulatory Visit
